# Patient Record
Sex: Male | Born: 1940
Health system: Southern US, Community
[De-identification: ages and names within clinical notes are randomized; demographics above are authoritative.]

## PROBLEM LIST (undated history)

## (undated) DIAGNOSIS — H532 Diplopia: Secondary | ICD-10-CM

## (undated) DIAGNOSIS — R42 Dizziness and giddiness: Secondary | ICD-10-CM

## (undated) DIAGNOSIS — E785 Hyperlipidemia, unspecified: Secondary | ICD-10-CM

## (undated) DIAGNOSIS — M549 Dorsalgia, unspecified: Secondary | ICD-10-CM

## (undated) DIAGNOSIS — K449 Diaphragmatic hernia without obstruction or gangrene: Secondary | ICD-10-CM

## (undated) DIAGNOSIS — I10 Essential (primary) hypertension: Secondary | ICD-10-CM

## (undated) DIAGNOSIS — E669 Obesity, unspecified: Secondary | ICD-10-CM

## (undated) DIAGNOSIS — K635 Polyp of colon: Secondary | ICD-10-CM

## (undated) DIAGNOSIS — I2510 Atherosclerotic heart disease of native coronary artery without angina pectoris: Secondary | ICD-10-CM

## (undated) DIAGNOSIS — R55 Syncope and collapse: Secondary | ICD-10-CM

## (undated) DIAGNOSIS — R001 Bradycardia, unspecified: Secondary | ICD-10-CM

## (undated) HISTORY — DX: Syncope and collapse: R55

## (undated) HISTORY — PX: CORONARY ARTERY BYPASS GRAFT: SHX141

## (undated) HISTORY — DX: Obesity, unspecified: E66.9

## (undated) HISTORY — DX: Diplopia: H53.2

## (undated) HISTORY — DX: Atherosclerotic heart disease of native coronary artery without angina pectoris: I25.10

## (undated) HISTORY — DX: Polyp of colon: K63.5

## (undated) HISTORY — DX: Diaphragmatic hernia without obstruction or gangrene: K44.9

## (undated) HISTORY — DX: Hyperlipidemia, unspecified: E78.5

## (undated) HISTORY — DX: Dorsalgia, unspecified: M54.9

## (undated) HISTORY — DX: Dizziness and giddiness: R42

## (undated) HISTORY — DX: Bradycardia, unspecified: R00.1

## (undated) HISTORY — DX: Essential (primary) hypertension: I10

---

## 2003-12-21 ENCOUNTER — Encounter (INDEPENDENT_AMBULATORY_CARE_PROVIDER_SITE_OTHER): Payer: Self-pay | Admitting: Specialist

## 2003-12-21 ENCOUNTER — Ambulatory Visit (HOSPITAL_COMMUNITY): Admission: RE | Admit: 2003-12-21 | Discharge: 2003-12-21 | Payer: Self-pay | Admitting: Gastroenterology

## 2007-07-04 ENCOUNTER — Inpatient Hospital Stay (HOSPITAL_COMMUNITY): Admission: EM | Admit: 2007-07-04 | Discharge: 2007-07-09 | Payer: Self-pay | Admitting: Emergency Medicine

## 2007-07-04 ENCOUNTER — Encounter (INDEPENDENT_AMBULATORY_CARE_PROVIDER_SITE_OTHER): Payer: Self-pay | Admitting: Cardiology

## 2007-07-04 ENCOUNTER — Ambulatory Visit: Payer: Self-pay | Admitting: Thoracic Surgery (Cardiothoracic Vascular Surgery)

## 2007-08-01 ENCOUNTER — Ambulatory Visit: Payer: Self-pay | Admitting: Thoracic Surgery (Cardiothoracic Vascular Surgery)

## 2007-08-01 ENCOUNTER — Encounter
Admission: RE | Admit: 2007-08-01 | Discharge: 2007-08-01 | Payer: Self-pay | Admitting: Thoracic Surgery (Cardiothoracic Vascular Surgery)

## 2009-05-21 ENCOUNTER — Inpatient Hospital Stay (HOSPITAL_COMMUNITY): Admission: EM | Admit: 2009-05-21 | Discharge: 2009-05-22 | Payer: Self-pay | Admitting: Emergency Medicine

## 2010-03-08 ENCOUNTER — Encounter
Admission: RE | Admit: 2010-03-08 | Discharge: 2010-03-08 | Payer: Self-pay | Admitting: Physical Medicine and Rehabilitation

## 2010-04-08 ENCOUNTER — Inpatient Hospital Stay (HOSPITAL_COMMUNITY): Admission: RE | Admit: 2010-04-08 | Discharge: 2010-04-09 | Payer: Self-pay | Admitting: Neurosurgery

## 2010-07-01 ENCOUNTER — Ambulatory Visit: Payer: Self-pay | Admitting: Cardiovascular Disease

## 2010-07-01 ENCOUNTER — Ambulatory Visit (HOSPITAL_COMMUNITY): Admission: RE | Admit: 2010-07-01 | Discharge: 2010-07-01 | Payer: Self-pay | Admitting: Cardiovascular Disease

## 2010-07-01 HISTORY — PX: US ECHOCARDIOGRAPHY: HXRAD669

## 2010-10-17 ENCOUNTER — Ambulatory Visit: Payer: Self-pay | Admitting: Cardiovascular Disease

## 2011-02-14 LAB — CBC
HCT: 40.8 % (ref 39.0–52.0)
MCHC: 34.5 g/dL (ref 30.0–36.0)
RDW: 13.6 % (ref 11.5–15.5)
WBC: 5.8 10*3/uL (ref 4.0–10.5)

## 2011-02-14 LAB — COMPREHENSIVE METABOLIC PANEL
Albumin: 3.9 g/dL (ref 3.5–5.2)
Alkaline Phosphatase: 47 U/L (ref 39–117)
CO2: 26 mEq/L (ref 19–32)
Chloride: 109 mEq/L (ref 96–112)
Creatinine, Ser: 1.45 mg/dL (ref 0.4–1.5)
GFR calc non Af Amer: 49 mL/min — ABNORMAL LOW (ref >60)
Glucose, Bld: 107 mg/dL — ABNORMAL HIGH (ref 70–99)
Potassium: 3.9 mEq/L (ref 3.5–5.1)
Sodium: 143 mEq/L (ref 135–145)
Total Bilirubin: 0.7 mg/dL (ref 0.3–1.2)

## 2011-02-14 LAB — DIFFERENTIAL
Basophils Relative: 1 % (ref 0–1)
Eosinophils Relative: 4 % (ref 0–5)
Lymphs Abs: 2 10*3/uL (ref 0.7–4.0)
Neutro Abs: 3 10*3/uL (ref 1.7–7.7)

## 2011-02-14 LAB — SURGICAL PCR SCREEN
MRSA, PCR: NEGATIVE
Staphylococcus aureus: NEGATIVE

## 2011-03-06 LAB — LIPID PANEL
Cholesterol: 134 mg/dL (ref 0–200)
HDL: 29 mg/dL — ABNORMAL LOW (ref >39)
LDL Cholesterol: 65 mg/dL (ref 0–99)
Total CHOL/HDL Ratio: 4.6 RATIO
Triglycerides: 201 mg/dL — ABNORMAL HIGH (ref <150)

## 2011-03-06 LAB — CBC
HCT: 39.9 % (ref 39.0–52.0)
HCT: 40.3 % (ref 39.0–52.0)
Hemoglobin: 13.5 g/dL (ref 13.0–17.0)
Hemoglobin: 13.8 g/dL (ref 13.0–17.0)
MCHC: 33.8 g/dL (ref 30.0–36.0)
MCHC: 34.1 g/dL (ref 30.0–36.0)
MCV: 94.4 fL (ref 78.0–100.0)
Platelets: 181 10*3/uL (ref 150–400)
Platelets: 186 10*3/uL (ref 150–400)
RBC: 4.27 MIL/uL (ref 4.22–5.81)
RDW: 14.1 % (ref 11.5–15.5)
RDW: 14.5 % (ref 11.5–15.5)
WBC: 6 K/uL (ref 4.0–10.5)

## 2011-03-06 LAB — PROTIME-INR
INR: 1.2 (ref 0.00–1.49)
Prothrombin Time: 15.7 seconds — ABNORMAL HIGH (ref 11.6–15.2)

## 2011-03-06 LAB — BASIC METABOLIC PANEL WITH GFR
BUN: 15 mg/dL (ref 6–23)
Calcium: 9.5 mg/dL (ref 8.4–10.5)
Chloride: 109 meq/L (ref 96–112)
Creatinine, Ser: 1.24 mg/dL (ref 0.4–1.5)
GFR calc Af Amer: >60 mL/min (ref >60)
Glucose, Bld: 110 mg/dL — ABNORMAL HIGH (ref 70–99)
Potassium: 3.7 meq/L (ref 3.5–5.1)
Sodium: 138 meq/L (ref 135–145)

## 2011-03-06 LAB — COMPREHENSIVE METABOLIC PANEL
AST: 22 U/L (ref 0–37)
Albumin: 3.4 g/dL — ABNORMAL LOW (ref 3.5–5.2)
Albumin: 3.8 g/dL (ref 3.5–5.2)
Alkaline Phosphatase: 47 U/L (ref 39–117)
BUN: 15 mg/dL (ref 6–23)
Calcium: 10 mg/dL (ref 8.4–10.5)
Calcium: 9.6 mg/dL (ref 8.4–10.5)
Chloride: 110 mEq/L (ref 96–112)
GFR calc Af Amer: 59 mL/min — ABNORMAL LOW (ref >60)
Glucose, Bld: 160 mg/dL — ABNORMAL HIGH (ref 70–99)
Potassium: 3.5 mEq/L (ref 3.5–5.1)
Total Protein: 5.8 g/dL — ABNORMAL LOW (ref 6.0–8.3)
Total Protein: 6.5 g/dL (ref 6.0–8.3)

## 2011-03-06 LAB — DIFFERENTIAL
Basophils Absolute: 0 K/uL (ref 0.0–0.1)
Basophils Relative: 1 % (ref 0–1)
Eosinophils Absolute: 0.3 10*3/uL (ref 0.0–0.7)
Eosinophils Relative: 5 % (ref 0–5)
Lymphocytes Relative: 38 % (ref 12–46)
Lymphs Abs: 2.3 K/uL (ref 0.7–4.0)
Monocytes Absolute: 0.5 K/uL (ref 0.1–1.0)
Monocytes Relative: 8 % (ref 3–12)
Neutro Abs: 2.9 10*3/uL (ref 1.7–7.7)
Neutrophils Relative %: 49 % (ref 43–77)

## 2011-03-06 LAB — POCT CARDIAC MARKERS
CKMB, poc: 1.4 ng/mL (ref 1.0–8.0)
Troponin i, poc: <0.05 ng/mL (ref 0.00–0.09)

## 2011-03-06 LAB — CK TOTAL AND CKMB (NOT AT ARMC)
CK, MB: 3.4 ng/mL (ref 0.3–4.0)
Relative Index: 2.5 (ref 0.0–2.5)
Total CK: 134 U/L (ref 7–232)

## 2011-03-06 LAB — APTT: aPTT: 36 s (ref 24–37)

## 2011-03-06 LAB — BASIC METABOLIC PANEL
CO2: 22 mEq/L (ref 19–32)
GFR calc non Af Amer: 58 mL/min — ABNORMAL LOW (ref >60)

## 2011-04-11 NOTE — H&P (Signed)
NAMESTEVIE, CHARTER NO.:  [id number]   MEDICAL RECORD NO.:  [id number]          PATIENT TYPE:  EMS   LOCATION:  MAJO                         FACILITY:  [facility]   PHYSICIAN:  Antonieta Iba, MD   DATE OF BIRTH:  1941-01-19   DATE OF ADMISSION:  05/21/2009  DATE OF DISCHARGE:                              HISTORY & PHYSICAL   James Barnes is a  70 year old male patient who comes to the emergency  room with complaints of chest pain.  He does have a prior cardiac  history of coronary bypass grafting on July 05, 2007 by Dr. Charlett Lango.  He had a LIMA to his LAD and RIMA to his  RCA and SVG to  his OM.  Since his bypass grafting, he has been doing well without any  complaints of angina until today.  He had right-sided chest pain  radiating to his right arm without  shortness of breath or diaphoresis.  He eventually took a nitroglycerin.  It did decrease the pain but the  pain continued on and he came to the emergency room.  He did receive  four baby aspirin in the emergency room.  His EKG shows no change from  EKG in 2008.   PAST MEDICAL HISTORY:  1. BPH.  2. Hyperlipidemia.  3. Hypertension.  4. Coronary artery disease.  5. Hiatal hernia.  6. History of sinus bradycardia.  He is not on a beta blocker because      of this.   MEDICATIONS:  1. Ramipril 5 mg every day.  2. Crestor 20 mg every day.  3. A BPH med - we are not sure at this time of the exact medication.  4. Aspirin 81 mg a day.   ALLERGIES:  NKDA.   FAMILY HISTORY:  Positive for coronary artery disease in his mother who  died at age 63 of an MI.  He has one brother who died of heart disease  and his father died as a suicide.   SOCIAL HISTORY:  He has 2 children and  5 grandchildren.  He does not  drink any alcohol.  He does smoke a pipe.  He  was unable to tell us the  amount; he just states a lot.   REVIEW OF SYSTEMS:  He denies any palpitations.  No presyncope, no  syncope.  Positive  for indigestion, heartburn.  It started with acid  foods. It has now progressed to having indigestion with just water. It  occurs after he eats. He denies any fever, cold, cough or chills  recently.  No palpitations.  No hematochezia.  No black tarry stools.  Positive for minor lower extremity edema which he seems to think  is his  Crestor.   PHYSICAL EXAMINATION:  VITAL SIGNS:  Blood pressure is 102/67, heart  rate 68, respirations are 16, temperature is 98.  GENERAL:  He is a bearded overweight gray aired male in no acute  distress.  HEENT: Pupils equal, round.  No thyromegaly, no adenopathy.  2+ carotid  pulses, no bruits.  Respirations are clear to auscultation  bilaterally  with good inspiratory effort.  CARDIAC:  Heart sounds are regular.  S1-S2 present.  No murmur or gallop  is noted.  GI:  Bowel sounds present x4.  No mass or tenderness.  MUSCULOSKELETAL:  Moves all extremities x4.  NEUROLOGIC:  Alert and oriented.  No focal deficits.  Strength is equal  bilaterally.  SKIN:  Warm and dry.  No lesions noted.  LOWER EXTREMITIES:  He has some minimal lower extremity edema.  Good  dorsalis pedis pulses.   LABS:  Sodium 138, potassium 3.7, BUN 15, creatinine 1.24, hematocrit  40, platelets 186.  WBCs are 6 and his hemoglobin was 13.8.  His enzymes  are negative thus far in the emergency room.  Again his EKG shows no  change from previous EKG.  He is sinus rhythm at 66 with incomplete  right bundle branch block.   ASSESSMENT:  1. Unstable angina.  2. Known coronary artery disease with history of coronary bypass      grafting August 2008 by Dr. Charlett Lango.  3. Hypertension.  4. Hyperlipidemia.  5. Recent increase in indigestion/ gastroesophageal reflux disease,      occurs after eating.  6. Benign prostatic hypertrophy.   PLAN:  He was seen and evaluated by Dr. Antonieta Iba.  We will plan  to admit and rule out for an MI and possibly do a cardiac  catheterization  depending upon his labs and if he has any further chest  pain.  If all of that is negative then he will have an outpatient  stress.      James Barnes, N.P.      Antonieta Iba, MD  Electronically Signed    BB/MEDQ  D:  05/21/2009  T:  05/21/2009  Job:  [id number]   cc:   Viviann Spare Dr. Andee Poles

## 2011-04-11 NOTE — Letter (Signed)
August 01, 2007   [name], M.D.  [phone], [address]  [place], [place] [address]   Re:  ONAJE, WARNE                DOB:  09/23/1941   Dear Maisie Fus:   I saw Kassidy Frankson back in the office today.  As you know, Mr. Pella is  a very nice, 70 year old gentleman who presented with unstable angina.  He underwent coronary artery bypass grafting on August 8, and is  progressing quite well at this point in time.  I have enclosed a copy of  my office notes for your records.   Again, thank you very much for allowing me to participate in Mr.  Guimond's care.  He is a delightful gentleman and it has been my pleasure  to be involved.   Salvatore Decent Dorris Fetch, M.D.  Electronically Signed   SCH/MEDQ  D:  08/01/2007  T:  08/01/2007  Job:  [id number]   cc:   Brett Canales A. Cleta Alberts, M.D.

## 2011-04-11 NOTE — Discharge Summary (Signed)
James Barnes, James Barnes NO.:  [id number]   MEDICAL RECORD NO.:  [id number]          PATIENT TYPE:  INP   LOCATION:  2024                         FACILITY:  [facility]   PHYSICIAN:  Salvatore Decent. Dorris Fetch, M.D.DATE OF BIRTH:  Apr 26, 1941   DATE OF ADMISSION:  07/04/2007  DATE OF DISCHARGE:  07/09/2007                               DISCHARGE SUMMARY   HISTORY OF PRESENT ILLNESS:  The patient is a 69 year old male who came  to the emergency room on this admission secondary to chest pain.  He  recently underwent cardiac catheterization by Dr. Daphene Jaeger at the  [facility] and was found to have an ostial LAD,  to RCA and  circumflex disease.  Dr. Tresa Endo recommended him for a coronary artery  bypass grafting on July 02, 2007.  He was placed on metoprolol,  amlodipine and Imdur as an outpatient.  However he did not take the  amlodipine.  He called the office on the date of admission [facility] with complaints of chest pain that was  occurring all last evening prior.  He took two nitroglycerin and some  aspirin and this decreased the pain, however, he continued to have  ongoing tightness.  During the morning when he awoke, he noted some  chest discomfort with activity as well as some left leg cramping.  He  called the office and was told to come promptly to the emergency room.   PAST MEDICAL HISTORY:  1. Coronary artery disease as described above.  2. He also has history of cardiac catheterization in 1996 by Dr.      Elease Hashimoto, having monitors disease at that time.  He was seen by Dr.      Elsie Lincoln in 2000 and the cath on July 02, 2007, showed severe three-      vessel disease as described above.  He has normal left ventricular      function.  3. Hyperlipidemia.  4. History of sinus bradycardia.   MEDICATIONS PRIOR TO ADMISSION:  Which were all new,  1. Metoprolol 50 mg one half tablet daily.  2. Imdur 60 mg daily.  3. Nitroglycerin  p.r.n.  4. He does not take an aspirin on a regular basis.   ALLERGIES:  NO KNOWN DRUG ALLERGIES.   FAMILY HISTORY:  Please see the history and physical done at the time of  admission.   SOCIAL HISTORY:  Please see the history and physical done at the time of  admission.   REVIEW OF SYSTEMS:  Please see the history and physical done at the time  of admission.   PHYSICAL EXAMINATION:  Please see the history and physical done at time  of admission.   HOSPITAL COURSE:  The patient was admitted for unstable angina with  known three-vessel coronary disease.  A cardiothoracic consultation was  obtained with the surgeons.  He was seen by Dr. Dorris Fetch who  evaluated the patient and his studies and agreed with recommendations to  proceed with surgical revascularization.   PROCEDURE:  On July 05, 2007, he was taken to the  operating room at  which time he underwent the following procedure:  Coronary artery bypass  grafting x3.  The following grafts placed:  1. Left internal mammary artery to the left anterior descending.  2. Right internal mammary artery to the right coronary artery.  3. Saphenous vein graft to the obtuse marginal #1.  The patient tolerated the procedure well and was taken to the surgical  intensive care unit in stable condition.   POSTOPERATIVE HOSPITAL COURSE:  The patient has done well.  He remained  hemodynamically stable.  He was weaned from all pressors without  difficulty.  He was weaned from the ventilator without difficulty.  All  routine lines, monitors and drainage devices have been discontinued in  the standard fashion.  He does have mild postoperative anemia but this  has stabilized.  Most recent hemoglobin and hematocrit dated July 08, 2007, are 10 and 30, respectively.  Electrolytes, BUN and creatinine are  within normal limits.  He did have a slight bump in his creatinine when  placed on ACE inhibitor but this has subsequently been discontinued.   His incisions are all healing well without signs of infection.  He is  edematous, required further diuresis as outpatient.  He is ambulating  currently with a walker primarily for balance and confidence.  He  requests a walker to be obtained for his use at home and we will assist  him with this.  Overall his status is felt to be stable for tentative  discharge in the morning of July 09, 2007, pending morning round  reevaluation.   MEDICATIONS:  Currently of dictation  1. Aspirin 325 mg daily.  2. Toprol XL 25 mg daily.  3. Zocor 40 mg daily.  4. Lasix 40 mg daily for 7 days.  5. K-Dur 20 mEq daily for 7 days.  6. For pain, Vicodin 5/500 one or two q.4h. as needed.   INSTRUCTIONS:  The patient received written instructions regarding  medications, activity, diet, wound care and follow-up.   FOLLOW UP:  1. Dr. Elsie Lincoln in two weeks.  2. Dr. Dorris Fetch on August 01, 2007, at 12 noon with a chest x-      ray one half hour prior to that at the [facility].   FINAL DIAGNOSIS:  Severe three-vessel coronary artery disease with  presenting unstable angina, now status post surgical revascularization  as described.   OTHER DIAGNOSES:  1. Hyperlipidemia.  2. History of tobacco use.  3. Mild postoperative anemia.  4. Obesity.      Rowe Clack, P.A.-C.      Salvatore Decent Dorris Fetch, M.D.  Electronically Signed    WEG/MEDQ  D:  07/08/2007  T:  07/09/2007  Job:  [id number]   cc:   [name], M.D.

## 2011-04-11 NOTE — Op Note (Signed)
NAMEJAYMIAN, Barnes NO.:  [id number]   MEDICAL RECORD NO.:  [id number]          PATIENT TYPE:  INP   LOCATION:  2312                         FACILITY:  [facility]   PHYSICIAN:  James Barnes, M.D.DATE OF BIRTH:  07/12/1941   DATE OF PROCEDURE:  07/05/2007  DATE OF DISCHARGE:                               OPERATIVE REPORT   PREOPERATIVE DIAGNOSIS:  Three vessel coronary disease with unstable  angina.   POSTOPERATIVE DIAGNOSIS:  Three vessel coronary disease with unstable  angina.   PROCEDURE:  Median sternotomy, coronary bypass grafting x3 (left  internal mammary artery to left anterior descending, right internal  mammary artery to right coronary, saphenous vein graft to obtuse  marginal one), endoscopic vein harvest, right thigh.   SURGEON:  James Barnes, M.D.   ASSISTANT:  Zadie Rhine, P.A.-C.   ANESTHESIA:  General.   FINDINGS:  Good quality targets, good quality conduits.   CLINICAL NOTE:  Mr. James Barnes is a 70 year old gentleman who presented with  new onset unstable angina.  At catheterization, he had three vessel  coronary disease with a tight critical right coronary lesion as well as  a 70% ostial circumflex lesion and a 60% ostial LAD lesion.  The patient  was scheduled to see [place] in the office, however presented with unstable  angina on July 04, 2007, and was admitted.  His pain resolved with  heparin and nitroglycerin.  The patient was advised to undergo the  coronary bypass grafting. He understood, accepted the risks, and agreed  to proceed.   OPERATIVE NOTE:  Mr. James Barnes was brought to the preop holding area on  July 05, 2007.  The anesthesia service placed lines for monitoring  arterial, central venous, and pulmonary arterial pressure.  Intravenous  antibiotics were administered.  He was taken to the operating room,  anesthetized and intubated.  A Foley catheter was placed.  The chest,  abdomen, and legs were prepped and  draped in the usual fashion.  An  incision was made in the medial aspect of the right leg at the level of  the knee and the greater saphenous vein was harvested from the right  thigh endoscopically.  It was of good quality.  Simultaneously, a median  sternotomy was performed.  The left internal mammary artery was  harvested under direct vision using standard technique.  5,000 units of  heparin was administered during the vessel harvest.  There was excellent  flow through the cut end of the left mammary window when it was divided  distally.  Next, the right internal mammary artery was harvested in a  similar fashion.  There was, likewise, excellent flow through this graft  when divided distally.   The remaining full heparin dose was administered.  Anticoagulation was  monitored with ACT measurement throughout the procedure.  The  pericardium was opened.  The ascending aorta was inspected and was  cannulated via concentric 2-0 Ethibond pledgeted pursestring sutures.  A  dual stage venous cannula was placed via pursestring suture in the right  atrial appendage.  Cardiopulmonary bypass was instituted.  The patient  was cooled to 32 degrees Celsius.  The coronary arteries were inspected  and the anastomotic sites were chosen.  The conduits were inspected and  cut to length. A foam pad was placed in the pericardium to protect the  left phrenic nerve.  A temperature probe was placed in the myocardial  septum.  A cardioplegic cannula was placed in the ascending aorta.   The aorta was crossclamped.  The left ventricle was emptied via the  aortic root vent.  Cardiac arrest was achieved with a combination of  cold antegrade blood cardioplegia and topical iced saline.  After  achieving a complete diastolic arrest and adequate myocardial septal  cooling with 1 liter of blood cardioplegia, the following distal  anastomoses were performed.   First, a reverse saphenous vein graft was placed  end-to-side to the main  trunk of the obtuse marginal branch of the left circumflex.  This was a  2 mm good quality target.  The vein was of good quality.  The  anastomosis was performed end-to-side with running 7-0 Prolene suture.  There was excellent flow through the graft.  Cardioplegia was  administered.  There was good hemostasis.   Next, the right internal mammary artery was brought through a window in  the pericardium and was beveled distally.  It was anastomosed end-to-  side to the right coronary artery at the acute margin of the heart.  The  right coronary was disease free beyond this point and a 1.5 mm probe  passed easily into the posterior descending as well as the continuation  of the right coronary.  The mammary was a 1.5-mm good quality conduit.  The anastomosis was performed with a running 8-0 Prolene suture. At the  completion of the anastomosis, the bulldog clamp was briefly removed to  inspect for hemostasis and then was replaced and the mammary pedicle was  tacked to the epicardial surface of the heart with 6-0 Prolene sutures.  Additional cardioplegia was administered.   Next, the left internal mammary artery was brought through a window in  the pericardium.  The distal end was beveled and anastomosed end-to-side  to the LAD.  The LAD was a 1.5-mm good quality target.  The mammary was  a 2-mm good quality conduit.  The anastomosis was performed with a  running 8-0 Prolene suture in an end-to-side fashion. At the completion  of the mammary to LAD anastomosis, the bulldog clamp was briefly removed  to inspect for hemostasis.  Immediate rapid septal rewarming was noted.  The bulldog clamp was replaced and the mammary pedicle was tacked to the  epicardial surface of the heart with 6-0 Prolene sutures.   Additional cardioplegia was administered.  The vein graft was cut to  length.  The cardioplegic cannula was removed from the ascending aorta.  The proximal vein graft  anastomosis was performed to a 4.5 mm punch  aortotomy with a running 6-0 Prolene suture while under crossclamp. At  the completion of the proximal anastomoses, the patient was placed in  Trendelenburg position. Lidocaine was administered.  The bulldog clamps  were removed from both mammary arteries.  The aortic root was de-aired  and the aortic crossclamp was removed.  The total crossclamp time was 53  minutes.   The patient required a single stimulation of 20 joules and remained in  sinus rhythm, thereafter.  While the patient was being rewarmed, all  proximal and distal anastomoses were inspected for hemostasis.  Epicardial pacing wires were placed on  the right ventricle and right  atrium and DDD pacing was initiated.  The patient was rewarmed to a core  temperature of 37 degrees Celsius and he was weaned from cardiopulmonary  bypass without difficulty on the first attempt.  The total bypass time  was 82 minutes.  The initial cardiac output was greater than 2 liters  per minute per meter squared.  The patient remained hemodynamically  stable throughout the bypass period.   A test dose of protamine was administered and was well tolerated. The  atrial and aortic cannulae were removed.  The remainder of the protamine  was administered without incident.  The chest was irrigated with 1 liter  of warm normal saline containing 1 gram of vancomycin.  Hemostasis was  achieved.  The pericardium was reapproximated with interrupted 3-0 silk  sutures.  It came together easily without tension.  Bilateral pleural  and single mediastinal chest tubes were placed through separate  subcostal incisions.  The sternum was closed with interrupted single and  double heavy gauge stainless steel wires.  The remaining incisions were  closed in standard fashion.  On-Q catheters  were placed bilaterally adjacent to the chest wound exiting between the  chest tube sites for infiltration of local anesthetic. The  patient was  taken from the operating room to the surgical intensive care unit in  critical but stable condition.  All sponge, needle and instrument counts  were correct at end of the procedure.      James Decent Dorris Barnes, M.D.     SCH/MEDQ  D:  07/05/2007  T:  07/06/2007  Job:  [id number]   cc:   James Barnes, M.D.  James Barnes, M.D.

## 2011-04-11 NOTE — H&P (Signed)
NAME:  KAEL, KEETCH NO.:  [id number]   MEDICAL RECORD NO.:  [id number]          PATIENT TYPE:  EMS   LOCATION:  MAJO                         FACILITY:  [facility]   PHYSICIAN:  Ritta Slot, MD     DATE OF BIRTH:  1941-01-28   DATE OF ADMISSION:  07/04/2007  DATE OF DISCHARGE:                              HISTORY & PHYSICAL   HISTORY OF PRESENT ILLNESS:  Mr. Machnik is a 70 year old who came to the  emergency room brought by a neighbor because of chest pain.  He recently  underwent cardiac catheterization by Dr. [name] at [facility].  He was found to have ostial LAD, RCA and circumflex  disease.  Dr. Tresa Endo recommended him for a coronary bypass grafting on  July 02, 2007.  He was placed on metoprolol, amlodipine and Imdur as an  outpatient.  However, he did not take the amlodipine.  He apparently  called the office this morning with complaints about chest pain all  night.  He states that last night he started having chest pain.  He took  2 nitroglycerin and some aspirin.  That decreased the patient; however,  it continued on.  At 4 a.m. he realized he was still having chest  tightness.  This morning after he arose, he noticed that he had chest  discomfort with activity.  He also had a left leg cramp, which he has a  history of chronic leg cramps, and he decided to call the office.  He  was told to come to emergency room.   PAST MEDICAL HISTORY:  Coronary artery disease previous years.  In 1996,  he had a cardiac cath by Dr. Melburn Popper and he had minor disease at that  time.  He had been seen by Dr. Elsie Lincoln in the year 2000.  Cath July 02, 2007 showed severe three-vessel disease as described above.  He has  normal LV function.  Hyperlipidemia.  He has sinus bradycardia.   MEDICATIONS:  All of which are new,  1. Metoprolol 50 mg one half every day.  2. Imdur 60 mg every day.  3. Nitroglycerin 1/150 p.r.n.  4. He states he does not take aspirin on  a regular basis.   ALLERGIES:  No known drug allergies.   FAMILY HISTORY:  His mother died at 69 with a myocardial infarction.  He  had one brother died with heart disease.  His father died as a suicide.  He has a lot of depression in his family.  One sister has breast cancer.  One brother has Parkinson's disease.  One sister has depression.   SOCIAL HISTORY:  He is divorced.  He has two children and five  grandchildren.  He lives alone.  He is active.  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  He does not exercise, but does woodworking  a lot around the house.  He does not sit very long.  He smokes a pipe  and he uses no street drugs.   REVIEW OF SYMPTOMS:  He denies any presyncope, no syncope.  No  unilateral weakness.  No visual changes.  No cough.  No cold.  No  infection.  Positive chest pain.  Positive dyspnea on exertion.  No PND.  No GERD.  Other systems are negative.   VITAL SIGNS:  Blood pressure 131/60, pulse 50, by EKG it is 48.  He has  sinus brady.  Respirations are 18, temperature 98.8.   LABORATORY DATA:  Hemoglobin 13.6, hematocrit 39.6, platelets 194, WBC  5.8.  INR 1.1.  Point-of-care marker CK-MB is 1.0.  Troponin less than  0.05.  Magnesium 2.1, sodium 137, potassium 3.6, BUN 12, creatinine  1.13, and glucose is 95.  Chest x-ray shows a low lung volume.  It shows  vascular congestion.   PHYSICAL EXAMINATION:  GENERAL:  He is sleepy, but he awakens easily.  HEENT:  Pupils equal, round, 2+ carotids.  No bruits.  He has no  thyromegaly.  NECK:  Supple.  No adenopathy.  RESPIRATORY:  Decreased breath sounds throughout.  CARDIOVASCULAR:  Heart sounds regular, no murmur or gallop.  S1 and S1.  ABDOMEN:  Bowel sounds present x4.  No mass, positive obese abdomen.  MUSCULOSKELETAL:  Moves all extremities x4.  No dysmobility.  NEURO:  Alert and oriented.  Facial symmetry equal and no focal  abnormality.   ASSESSMENT:  1. Unstable angina.  2. Known  severe ostial three-vessel coronary disease.  3. Normal ejection fraction.  4. Tobacco use.  5. History of hyperlipidemia.   PLAN:  We will admit him, put him on IV heparin, IV nitroglycerin.  We  will adjust his medications, call [facility] for a consult.  We will also  lipid drugs.      Lezlie Octave, N.P.      Ritta Slot, MD  Electronically Signed    BB/MEDQ  D:  07/04/2007  T:  07/05/2007  Job:  [id number]   cc:   Brett Canales A. Cleta Alberts, M.D.  Madaline Savage, M.D.

## 2011-04-11 NOTE — Assessment & Plan Note (Signed)
OFFICE VISIT   James Barnes, James Barnes A  DOB:  1941/05/12                                        August 01, 2007  CHART #:  [id number]   [name] is a 70 year old gentleman who presented with new onset  angina.  He underwent catheterization by Dr. Tresa Endo as an outpatient and  he had 3-vessel coronary disease.  He had scheduled to see [place] in the  office; however, he developed unstable angina and was admitted to the  hospital.  He underwent coronary artery bypass grafting x3 on August  8th.  Postoperatively, he did well.  He was discharged home on  postoperative day #4 without any significant complications.  Since then,  he has continued to do well.  He has been walking on a regular basis.  He says he is walking about 10 minutes at a time, up a couple of hills,  and does it 4-5 times a day.  He was having a lot of difficulty with  sleep initially, when he first got home he was having difficulty  sleeping because he could not lie flat and he could not sleep on his  back.  He got a prescription for some sleeping pills from Dr. Tresa Endo but  that did not seem to help but he says that has gotten better over time.  He realized that he had just some fear of going to sleep on his back and  that he was able to sleep through the night last night.  He is sleeping  on his side now without significant discomfort and is no longer using  the sleeping pills.  He did have some problems with constipation  initially.  That has subsequently resolved as well.  He is no longer  having to take any pain medications but he occasionally does take a  Tylenol.   PHYSICAL EXAMINATION:  GENERAL:  [name] is a well-appearing, 66-year-  old, white male in no acute distress.  VITAL SIGNS:  His blood pressure is 132/88, pulse 77, respirations 18,  oxygen saturation 98% on room air.  LUNGS:  Clear with equal breath sounds bilaterally.  CARDIAC:  Regular rate and rhythm.  Normal S1 and S2.  No  rubs, murmurs,  or gallops.  CHEST:  Sternal incision is clean, dry and intact.  The sternum is  stable.  EXTREMITIES:  Leg incision is healing well.  There is no peripheral  edema.   Chest x-ray shows good aeration of the lungs bilaterally.   IMPRESSION:  [name] is a 70 year old gentleman who is status post  coronary artery bypass grafting on August 8th.  He is progressing well  at this point in time.  I think he is a little bit ahead of the curve  with his overall recovery.  He did have a lot of difficulty with sleep  but that seems to have resolved.  He is having minimal discomfort and is  not having to take any narcotics.   He is anxious to return to work.  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  I advised him to wait at least a couple more  weeks before trying to do that even on a part-time basis.  He may begin  driving, appropriate precautions were discussed.  He is not to  lift  objects greater than 10 pounds for at least another 2 weeks.   He will continue to be followed by Dr. Tresa Endo from a cardiac standpoint  as well as by Dr. Cleta Alberts.  I would be happy to see him back at any time if  I could be of any further assistance with his care.   Salvatore Decent Dorris Fetch, M.D.  Electronically Signed   SCH/MEDQ  D:  08/01/2007  T:  08/01/2007  Job:  [id number]   cc:   [name], M.D.  [name], M.D.

## 2011-04-11 NOTE — Consult Note (Signed)
NAMEREED, DADY NO.:  [id number]   MEDICAL RECORD NO.:  [id number]          PATIENT TYPE:  INP   LOCATION:  2901                         FACILITY:  [facility]   PHYSICIAN:  Salvatore Decent. Dorris Fetch, M.D.DATE OF BIRTH:  July 25, 1941   DATE OF CONSULTATION:  07/04/2007  DATE OF DISCHARGE:                                 CONSULTATION   REASON FOR CONSULTATION:  Three-vessel disease with unstable angina.   CHIEF COMPLAINT:  Chest pain.   HISTORY OF PRESENT ILLNESS:  James Barnes is a 70 year old gentleman who  had a previous cardiac catheterization 12 years ago where he had  insignificant coronary disease.  He has had no history of significant  cardiac pathology. On Saturday while working, he noted a severe episode  of chest tightness. He says this was about a 7/10 in severity.  It came  on with exertion and was relieved by rest.  He had an episode again on  Sunday and was seen by cardiology by Dr. Tresa Endo in the office on Monday.  It was felt that he should have cardiac catheterization and that was  done as an outpatient on Tuesday and he was found to have three-vessel  coronary disease.  He had a critically tight stenosis in his right  coronary. He had about a 70% ostial circumflex and a 60% ostial LAD. The  patient was scheduled to see our group in the office next week, however,  last night he had another episode of pain which came on in his sleep.  This was persistent and severe. He called the office this morning after  having another episode and was told to come to the emergency room. He  was admitted, started on intravenous heparin and nitroglycerin.  This  pain has resolved. His cardiac enzymes showed a troponin of 0.02.  CPK  was 80 with MB of 1.9.   PAST MEDICAL HISTORY:  Significant for hyperlipidemia, diagnosed with  hypertension about a year ago. He denies diabetes, stroke, DVT previous  cardiac disease.   MEDICATIONS:  His medications which have all been  started recently are  metoprolol, XL 75 mg daily, Imdur 60 mg daily and sublingual  nitroglycerin.   He has no known drug allergies.   FAMILY HISTORY:  Significant for cardiac disease for which a brother  died. Depression is common in his family and also Parkinson's disease.   SOCIAL HISTORY:  He is divorced.  He has two children.  He remains  physically active. 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 He smokes a pipe.  He lives alone.   REVIEW OF SYSTEMS:  He has leg cramps at night, no claudication type  symptoms. He does wear glasses.  No recent change in bowel or bladder  habits and no orthopnea, paroxysmal nocturnal dyspnea, peripheral edema.  No history of excessive bleeding or bruising. All other systems are  negative.   PHYSICAL EXAMINATION:  James Barnes is a 70 year old white male in no  acute distress.  His blood pressure is 112/63, pulse is 46 and regular,  respirations are  16.  He is well-developed, well-nourished.  NEUROLOGICAL:  He is alert and oriented x3, grossly intact, rather  stoic.  HEENT:  He is wearing glasses, otherwise unremarkable.  He has good  dentition.  NECK:  Supple without thyromegaly or adenopathy. He has a left carotid  bruit.  CARDIAC:  Regular rate and rhythm.  Normal S1 and S2.  No rubs, murmurs  or gallops.  LUNGS:  Clear with equal breath sounds bilaterally.  ABDOMEN:  Soft, nontender.  EXTREMITIES:  Without clubbing, cyanosis or  edema.  He has 2+ pulses throughout.  There is no peripheral edema.  Skin is warm and dry.   LABORATORY DATA:  Cardiac enzymes as previously mentioned. Cardiac  catheterization was reviewed.  There is a 60% ostial LAD lesion, a 70%  ostial circumflex lesion and tandem 80 and 90% lesions in his right  coronary. His sodium is 137, potassium 3.6. BUN and creatinine 12 and  1.1. Glucose of 95.  LFTs within normal limits.  Albumin is 3.5 PT is  14.7, PTT is 35. His white count is 5.8, hematocrit  40, platelets 194.   IMPRESSION:  James Barnes is a 70 year old gentleman with three-vessel  coronary disease who presents with unstable angina. At catheterization 2  days ago, he was found to have a tight right lesion which is likely his  culprit vessel but he also has ostial LAD and circumflex disease and  coronary artery bypass grafting is indicated for survival benefit as  well as relief of symptoms and myocardial preservation. I have discussed  in detail with James Barnes the indications, risks, benefits and  alternatives for treatment.  He understands the LAD and circumflex  lesions are not amenable to percutaneous intervention.  He understands  that CABG has survival benefit as well as pain relief benefit over  medical therapy. James Barnes understands the risks include but are not  limited to death, stroke, MI, DVT, PE, bleeding, possible need for  transfusions, infections as well as other organ system dysfunction  including respiratory, renal or GI complications.  He understands and  accepts these risks and agrees to proceed.  Will plan to proceed with  bilateral mammary harvest as well as saphenous vein. He will be  scheduled for the OR first case tomorrow morning.      Salvatore Decent Dorris Fetch, M.D.  Electronically Signed     SCH/MEDQ  D:  07/04/2007  T:  07/05/2007  Job:  [id number]   cc:   [name], M.D.  [name], M.D.

## 2011-04-11 NOTE — Discharge Summary (Signed)
[name], [name] NO.:  [id number]   MEDICAL RECORD NO.:  [id number]          PATIENT TYPE:  INP   LOCATION:  2923                         FACILITY:  [facility]   PHYSICIAN:  James Barnes, M.D.     DATE OF BIRTH:  [birth year]   DATE OF ADMISSION:  05/21/2009  DATE OF DISCHARGE:  05/22/2009                               DISCHARGE SUMMARY   DISCHARGE DIAGNOSES:  1. Chest pain/angina, negative for CK-MBs and troponins.  He is for      outpatient stress testing.  2. Known coronary artery disease with history of coronary bypass      grafting by Dr. Charlett Barnes on July 05, 2007, with a left      internal mammary artery graft to his left anterior descending,      right internal mammary artery graft to his right coronary artery,      and saphenous vein graft to his obtuse marginal.  3. Dyslipidemia.  4. Sinus bradycardia, unable to be on a beta-blocker.  5. Benign prostatic hypertrophy.  6. Hypertension.  7. Hiatal hernia.  8. Gastroesophageal reflux disease.   LABORATORY DATA:  CK-MBs and troponins were negative.  Initial CBC  showed a hemoglobin of 13.8, hematocrit of 40.3, WBCs of 6, and  platelets 186.  His sodium was 138, potassium 3.7, glucose 110, BUN 15,  creatinine 1.24, magnesium was 2.4, total cholesterol is 134,  triglycerides 201, HDL was 29, LDL was 65.  Chest x-ray showed stable  borderline cardiomegaly, no acute abnormality.   DISCHARGE MEDICATIONS:  1. Ramipril 5 mg a day.  2. Crestor 20 mg a day.  3. Aspirin 81 mg a day.  4. His BPH medication and nitroglycerin 1/150 p.r.n.  5. Pepcid 20 mg twice per day.   HOSPITAL COURSE:  James Barnes is a 70 year old male patient of Dr. Nicki Barnes who came into the emergency room with complaints of right-sided  chest pain.  His pain was decreased with nitroglycerin, but not  completely gone.  It was decided because of his prior history of  coronary artery disease with coronary artery bypass grafting  in 2008  that he should be admitted and ruled out for MI.  Further treatment  would depend upon his lab status and further symptoms.  His CK-MBs and  troponins all came back negative.  The following day, he was seen by Dr.  Mariah Barnes.  He was chest painfree.  It was decided that he could be  discharged home.  Also, the patient did say that he was having increased  episodes of indigestion.  He was taking Tums at home and this was  relieving his  indigestion.  Thus, his chest pain could also be of GI etiology.  It was  decided that he could be worked up as an outpatient with a treadmill  Myoview.  The patient knows if he has any further problems, he can give  our office a call.  On the day of discharge, his blood pressure was  122/70, heart rate was 50, temperature was 97.7.  James Barnes, N.P.    ______________________________  James Barnes, M.D.    BB/MEDQ  D:  05/22/2009  T:  05/22/2009  Job:  [id number]   cc:   Salvatore Decent. Dorris Fetch, M.D.

## 2011-04-14 NOTE — Op Note (Signed)
NAMEBILAL, MANZER                            ACCOUNT NO.:  [id number]   MEDICAL RECORD NO.:  [id number]                   PATIENT TYPE:  AMB   LOCATION:  ENDO                                 FACILITY:  [facility]   PHYSICIAN:  Sylar L. Malon Kindle., M.D.          DATE OF BIRTH:  03-Aug-1941   DATE OF PROCEDURE:  12/21/2003  DATE OF DISCHARGE:                                 OPERATIVE REPORT   PROCEDURE PERFORMED:  Colonoscopy with polypectomy.   ENDOSCOPIST:  Llana Aliment. Edwards, M.D.   MEDICATIONS:  Fentanyl 75 mcg, Versed 7 mg IV.   INSTRUMENT USED:  Pediatric Olympus adjustable colonoscope.   INDICATIONS FOR PROCEDURE:  Colon cancer screening.   DESCRIPTION OF PROCEDURE:  The procedure had been explained to the patient  including the potential risks and benefits and consent obtained.  With the  patient in the left lateral decubitus position, the pediatric adjustable  Olympus colonoscope was inserted and advanced.  Prep was excellent and we  were able to advance to the cecum.  In the tip of the cecum a 0.5 cm sessile  polyp was encountered.  This was cauterized with a hot biopsy forceps and  placed in jar #1.  The scope was withdrawn back to the ascending colon.  In  the midascending colon, a 2 cm sessile polyp was removed in two pieces.  These were recovered with a basket and placed in jar #2.  No polyps were  seen in the transverse, descending or sigmoid colon.  At approximately 30 cm  from the anal verge at what was felt to be the rectosigmoid junction area, a  0.5 cm sessile polyp was cauterized and placed in jar #3.  In the rectum  exactly 15 cm from the anal verge, a 1.5 cm sessile polyp was found and was  removed with a snare in one piece and placed in jar #4.  There was no  bleeding at any of the polypectomy sites.  The scope was withdrawn.  The  patient tolerated the procedure well, there were no immediate complications.   ASSESSMENT:  Multiple colon polyps removed.   PLAN:   Will recommend routine postpolypectomy instructions.  Recommend two  year repeat due to the fact that he did have multiple polyps.                                               Kyro L. Malon Kindle., M.D.    Waldron Session  D:  12/21/2003  T:  12/21/2003  Job:  [id number]   cc:   Brett Canales A. Cleta Alberts, M.D.  [address]  [place]  [place] [address]  Fax: [phone]

## 2011-05-02 ENCOUNTER — Other Ambulatory Visit: Payer: Self-pay | Admitting: *Deleted

## 2011-05-02 NOTE — Telephone Encounter (Signed)
rx received from [facility], crestor 20mg . Pt needs labs drawn and was contacted. Pt had labs drawn already from pcp. Dr Wylene Simmer staff and the pharmacy notified dr Wylene Simmer will due refill, pt informed and ok.Alfonso Ramus RN

## 2011-07-05 ENCOUNTER — Other Ambulatory Visit: Payer: Self-pay | Admitting: *Deleted

## 2011-07-05 MED ORDER — ROSUVASTATIN CALCIUM 20 MG PO TABS: 20.0000 mg | ORAL_TABLET | Freq: Every day | ORAL | Status: DC

## 2011-07-05 NOTE — Telephone Encounter (Signed)
MESSAGE LEFT, NEEDS OV SOON, GVE REFILL OF CRESTOR FOR THREE MONTHS

## 2011-09-11 LAB — COMPREHENSIVE METABOLIC PANEL
ALT: 24
BUN: 12
CO2: 25
Calcium: 9.3
Creatinine, Ser: 1.13
GFR calc non Af Amer: >60
Glucose, Bld: 95
Sodium: 137

## 2011-09-11 LAB — POCT I-STAT 3, ART BLOOD GAS (G3+)
Acid-Base Excess: 1
Bicarbonate: 19.1 — ABNORMAL LOW
Bicarbonate: 21.5
Bicarbonate: 24.5 — ABNORMAL HIGH
O2 Saturation: 100
O2 Saturation: 98
Operator id: 261841
Operator id: 3390
Patient temperature: 32.4
Patient temperature: 36
Patient temperature: 37.1
TCO2: 23
pCO2 arterial: 36.1
pCO2 arterial: 38.3
pH, Arterial: 7.307 — ABNORMAL LOW
pH, Arterial: 7.378
pO2, Arterial: 102 — ABNORMAL HIGH
pO2, Arterial: 78 — ABNORMAL LOW

## 2011-09-11 LAB — CBC
HCT: 32.3 — ABNORMAL LOW
HCT: 34.5 — ABNORMAL LOW
HCT: 38.7 — ABNORMAL LOW
HCT: 39.6
Hemoglobin: 11.9 — ABNORMAL LOW
Hemoglobin: 13.6
MCHC: 34.3
MCHC: 34.4
MCV: 92.1
MCV: 92.2
MCV: 93
MCV: 93.8
MCV: 93.9
Platelets: 141 — ABNORMAL LOW
Platelets: 142 — ABNORMAL LOW
Platelets: 175
Platelets: 195
RBC: 3.23 — ABNORMAL LOW
RBC: 3.45 — ABNORMAL LOW
RBC: 4.29
RDW: 13.4
RDW: 13.6
RDW: 13.9
WBC: 10.2
WBC: 10.7 — ABNORMAL HIGH
WBC: 7
WBC: 7.8
WBC: 8.7

## 2011-09-11 LAB — I-STAT EC8
Acid-base deficit: 1
Acid-base deficit: 2
Bicarbonate: 25.4 — ABNORMAL HIGH
Chloride: 107
Glucose, Bld: 112 — ABNORMAL HIGH
HCT: 34 — ABNORMAL LOW
Operator id: 261841
Potassium: 3.7
Potassium: 4.4
Sodium: 141
TCO2: 27
pCO2 arterial: 47.1 — ABNORMAL HIGH
pH, Arterial: 7.34 — ABNORMAL LOW

## 2011-09-11 LAB — TYPE AND SCREEN: ABO/RH(D): A POS

## 2011-09-11 LAB — BASIC METABOLIC PANEL
BUN: 10
Calcium: 9
Chloride: 106
Chloride: 110
Creatinine, Ser: 1.14
Creatinine, Ser: 1.31
Creatinine, Ser: 1.54 — ABNORMAL HIGH
GFR calc Af Amer: 55 — ABNORMAL LOW
GFR calc Af Amer: >60
Glucose, Bld: 112 — ABNORMAL HIGH
Potassium: 4

## 2011-09-11 LAB — POCT I-STAT 4, (NA,K, GLUC, HGB,HCT)
Glucose, Bld: 105 — ABNORMAL HIGH
Glucose, Bld: 115 — ABNORMAL HIGH
Glucose, Bld: 97
HCT: 27 — ABNORMAL LOW
Hemoglobin: 12.9 — ABNORMAL LOW
Hemoglobin: 9.2 — ABNORMAL LOW
Hemoglobin: 9.2 — ABNORMAL LOW
Operator id: 261841
Operator id: 3390
Potassium: 4.1
Potassium: 5.3 — ABNORMAL HIGH
Potassium: 5.7 — ABNORMAL HIGH
Sodium: 135

## 2011-09-11 LAB — POCT I-STAT 3, VENOUS BLOOD GAS (G3P V)
O2 Saturation: 78
Patient temperature: 32.4
TCO2: 26
pH, Ven: 7.438 — ABNORMAL HIGH

## 2011-09-11 LAB — POCT I-STAT GLUCOSE
Glucose, Bld: 115 — ABNORMAL HIGH
Operator id: 3390

## 2011-09-11 LAB — LIPID PANEL
HDL: 30 — ABNORMAL LOW
LDL Cholesterol: 167 — ABNORMAL HIGH
Triglycerides: 153 — ABNORMAL HIGH
VLDL: 31

## 2011-09-11 LAB — POCT CARDIAC MARKERS
CKMB, poc: 1.1
CKMB, poc: <1 — ABNORMAL LOW
Troponin i, poc: <0.05
Troponin i, poc: <0.05

## 2011-09-11 LAB — TROPONIN I: Troponin I: 0.02

## 2011-09-11 LAB — BLOOD GAS, ARTERIAL
O2 Saturation: 95.1
Patient temperature: 98.3

## 2011-09-11 LAB — URINALYSIS, ROUTINE W REFLEX MICROSCOPIC
Bilirubin Urine: NEGATIVE
Hgb urine dipstick: NEGATIVE
Protein, ur: NEGATIVE
Urobilinogen, UA: 0.2

## 2011-09-11 LAB — CREATININE, SERUM
Creatinine, Ser: 1.37
GFR calc Af Amer: >60
GFR calc Af Amer: >60
GFR calc non Af Amer: 52 — ABNORMAL LOW
GFR calc non Af Amer: >60

## 2011-09-11 LAB — ABO/RH: ABO/RH(D): A POS

## 2011-09-11 LAB — MAGNESIUM
Magnesium: 2.1
Magnesium: 2.4
Magnesium: 2.4
Magnesium: 2.6 — ABNORMAL HIGH
Magnesium: 2.6 — ABNORMAL HIGH

## 2011-09-11 LAB — CARDIAC PANEL(CRET KIN+CKTOT+MB+TROPI): CK, MB: 1.5

## 2011-09-11 LAB — APTT
aPTT: 35
aPTT: 49 — ABNORMAL HIGH

## 2011-09-11 LAB — CK TOTAL AND CKMB (NOT AT ARMC): CK, MB: 1.9

## 2011-10-09 ENCOUNTER — Other Ambulatory Visit: Payer: Self-pay | Admitting: Cardiovascular Disease

## 2011-10-09 MED ORDER — ROSUVASTATIN CALCIUM 20 MG PO TABS: 20.0000 mg | ORAL_TABLET | Freq: Every day | ORAL | Status: DC

## 2011-11-13 ENCOUNTER — Other Ambulatory Visit: Payer: Self-pay | Admitting: *Deleted

## 2011-11-16 ENCOUNTER — Telehealth: Payer: Self-pay | Admitting: Cardiovascular Disease

## 2011-11-16 MED ORDER — ROSUVASTATIN CALCIUM 20 MG PO TABS: 20.0000 mg | ORAL_TABLET | Freq: Every day | ORAL | Status: DC

## 2011-11-16 NOTE — Telephone Encounter (Signed)
Pt given an app and refills till app, knows to come in fasting for lab work.

## 2011-11-16 NOTE — Telephone Encounter (Signed)
New Problem:    Called in to schedule an appointment because his Crestor has run out and it can't be refilled until he has been seen by Dr. Elease Hashimoto. Patient was dissatisfied with January appointment availabilities and wanted to be seen sooner because he has no crestor. Please call back.

## 2011-11-28 HISTORY — PX: CARDIOVASCULAR STRESS TEST: SHX262

## 2011-11-28 HISTORY — PX: US ECHOCARDIOGRAPHY: HXRAD669

## 2011-12-21 ENCOUNTER — Encounter: Payer: Self-pay | Admitting: Cardiovascular Disease

## 2011-12-21 ENCOUNTER — Encounter: Payer: Self-pay | Admitting: *Deleted

## 2011-12-28 ENCOUNTER — Encounter: Payer: Self-pay | Admitting: Cardiovascular Disease

## 2011-12-28 ENCOUNTER — Ambulatory Visit (INDEPENDENT_AMBULATORY_CARE_PROVIDER_SITE_OTHER): Payer: Medicare Other | Admitting: Cardiovascular Disease

## 2011-12-28 DIAGNOSIS — I2510 Atherosclerotic heart disease of native coronary artery without angina pectoris: Secondary | ICD-10-CM | POA: Insufficient documentation

## 2011-12-28 DIAGNOSIS — R55 Syncope and collapse: Secondary | ICD-10-CM

## 2011-12-28 DIAGNOSIS — E785 Hyperlipidemia, unspecified: Secondary | ICD-10-CM

## 2011-12-28 MED ORDER — ATORVASTATIN CALCIUM 20 MG PO TABS: 20.0000 mg | ORAL_TABLET | Freq: Every day | ORAL | Status: DC

## 2011-12-28 NOTE — Patient Instructions (Addendum)
Your physician recommends that you schedule a follow-up appointment in: 3 MONTHS WITH FASTING LABS  Your physician has requested that you have an echocardiogram. Echocardiography is a painless test that uses sound waves to create images of your heart. It provides your doctor with information about the size and shape of your heart and how well your heart's chambers and valves are working. This procedure takes approximately one hour. There are no restrictions for this procedure.   Your physician has requested that you have en exercise stress myoview. For further information please visit [email or web address] Please follow instruction sheet, as given.  Your physician has recommended you make the following change in your medication:   STOP CRESTOR  START ATORVASTATIN 20 MG DAILY    HOLD ALTACE FOR 1-2 WEEKS TO SEE IF FEEL BETTER/ NO SYNCOPE

## 2011-12-28 NOTE — Assessment & Plan Note (Addendum)
James Barnes is doing well. He has a history of coronary artery disease and is status post CABG 5 years ago. We will check a fasting lipid profile. He's not having any symptoms of angina. He is having some syncope which we will be evaluating.

## 2011-12-28 NOTE — Progress Notes (Signed)
    James Barnes Date of Birth  04/29/1941 [facility]     [place] [facility]  [address]    [address]   [address] [place], [place]  [address]    [place], [place]  [address] [phone]  Fax  [phone]  [phone]  Fax [phone]   Problem list: 1. Coronary artery disease-status post CABG in 2008 2. Dyslipidemia 3. Hypertension 4. Syncope History of Present Illness:  James Barnes is a 71 year old gentleman with the above-noted medical history. He's been having some episodes of syncope recently. These episodes tend to occur with exertion.  He denies any vertigo symptoms.  He's noted that he has these episodes of syncope with fairly low levels of exertion at this point.  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    He tolerates his Crestor fairly well although he would like to get something cheaper. He does note that he has some muscle aches but he thinks are associated with the Crestor    Current Outpatient Prescriptions on File Prior to Visit  Medication Sig Dispense Refill  . aspirin 325 MG tablet Take 325 mg by mouth daily.      . Multiple Vitamins-Minerals (MULTIVITAMIN PO) Take by mouth daily.      . ramipril (ALTACE) 5 MG tablet Take 5 mg by mouth daily.      . rosuvastatin (CRESTOR) 20 MG tablet Take 1 tablet (20 mg total) by mouth at bedtime.  30 tablet  1  . Tamsulosin HCl (FLOMAX) 0.4 MG CAPS Take 0.4 mg by mouth daily.        No Known Allergies  Past Medical History  Diagnosis Date  . HTN (hypertension)   . Back pain   . Dyslipidemia   . CAD (coronary artery disease)     status post CABG in 2008  . Sinus bradycardia     Hx of  . Hiatal hernia     Past Surgical History  Procedure Date  . Coronary artery bypass graft 2008    with a left internal mammary artery graft to his left anterior descending, right internal mammary  artery graft to his right coronary artery, and  saphenous  vein graft to his obtuse marginal.   . US echocardiography 07-01-2010    EF 55-60%    History  Smoking status  . Current Everyday Smoker  . Types: Pipe  Smokeless tobacco  . Not on file    History  Alcohol Use No    No family history on file.  Reviw of Systems:  Reviewed in the HPI.  All other systems are negative.  Physical Exam: BP 111/71  Pulse 59  Ht 5\' 9"  (1.753 m)  Wt 231 lb (104.781 kg)  BMI 34.11 kg/m2 The patient is alert and oriented x 3.  The mood and affect are normal.   Skin: warm and dry.  Color is normal.    HEENT:   Normocephalic/atraumatic. Normal carotids. His neck is supple. There is no JVD. Mucous membranes are moist.  Lungs: Lungs are clear   Heart: Regular rate S1-S2. No murmurs   Abdomen: Good bowel sounds. There is no hepatosplenomegaly.  Extremities:  No clubbing cyanosis or edema.  Neuro:  Normal neuro exam. Gait is normal    ECG: Normal sinus rhythm. He has an incomplete right bundle like.  Assessment / Plan:

## 2011-12-28 NOTE — Assessment & Plan Note (Signed)
I will presents with episodes of exertional syncope. He denies any real problems with vertigo. He does not have any angina with exertion but his symptoms are suspicious.  We'll get an echocardiogram. We will also schedule him for a stress Myoview study.  I've given him the okay to hold his Altase for a week or 2 to see if this helps with his syncope. I'll see him again in 3 months for followup visit. I told him that I would see him sooner if the echocardiogram or stress test revealed significant problems.

## 2012-01-03 ENCOUNTER — Ambulatory Visit (HOSPITAL_COMMUNITY): Payer: Medicare Other | Attending: Cardiovascular Disease

## 2012-01-03 DIAGNOSIS — I10 Essential (primary) hypertension: Secondary | ICD-10-CM | POA: Diagnosis not present

## 2012-01-03 DIAGNOSIS — E785 Hyperlipidemia, unspecified: Secondary | ICD-10-CM | POA: Insufficient documentation

## 2012-01-03 DIAGNOSIS — R55 Syncope and collapse: Secondary | ICD-10-CM

## 2012-01-03 DIAGNOSIS — F17200 Nicotine dependence, unspecified, uncomplicated: Secondary | ICD-10-CM | POA: Insufficient documentation

## 2012-01-03 DIAGNOSIS — I2510 Atherosclerotic heart disease of native coronary artery without angina pectoris: Secondary | ICD-10-CM

## 2012-01-08 ENCOUNTER — Ambulatory Visit (HOSPITAL_COMMUNITY): Payer: Medicare Other | Attending: Cardiology | Admitting: Radiology

## 2012-01-08 DIAGNOSIS — I2510 Atherosclerotic heart disease of native coronary artery without angina pectoris: Secondary | ICD-10-CM

## 2012-01-08 DIAGNOSIS — R55 Syncope and collapse: Secondary | ICD-10-CM | POA: Diagnosis not present

## 2012-01-08 DIAGNOSIS — Z951 Presence of aortocoronary bypass graft: Secondary | ICD-10-CM | POA: Diagnosis not present

## 2012-01-08 DIAGNOSIS — R0609 Other forms of dyspnea: Secondary | ICD-10-CM | POA: Insufficient documentation

## 2012-01-08 DIAGNOSIS — E669 Obesity, unspecified: Secondary | ICD-10-CM | POA: Insufficient documentation

## 2012-01-08 DIAGNOSIS — R0602 Shortness of breath: Secondary | ICD-10-CM

## 2012-01-08 DIAGNOSIS — R0989 Other specified symptoms and signs involving the circulatory and respiratory systems: Secondary | ICD-10-CM | POA: Insufficient documentation

## 2012-01-08 DIAGNOSIS — I10 Essential (primary) hypertension: Secondary | ICD-10-CM | POA: Diagnosis not present

## 2012-01-08 DIAGNOSIS — E785 Hyperlipidemia, unspecified: Secondary | ICD-10-CM | POA: Insufficient documentation

## 2012-01-08 DIAGNOSIS — F17200 Nicotine dependence, unspecified, uncomplicated: Secondary | ICD-10-CM | POA: Insufficient documentation

## 2012-01-08 DIAGNOSIS — Z8249 Family history of ischemic heart disease and other diseases of the circulatory system: Secondary | ICD-10-CM | POA: Diagnosis not present

## 2012-01-08 DIAGNOSIS — I4510 Unspecified right bundle-branch block: Secondary | ICD-10-CM | POA: Diagnosis not present

## 2012-01-08 MED ORDER — REGADENOSON 0.4 MG/5ML IV SOLN
0.4000 mg | Freq: Once | INTRAVENOUS | Status: AC
  Administered 2012-01-08: 0.4 mg via INTRAVENOUS

## 2012-01-08 MED ORDER — TECHNETIUM TC 99M TETROFOSMIN IV KIT
33.0000 | PACK | Freq: Once | INTRAVENOUS | Status: AC | PRN
  Administered 2012-01-08: 33 via INTRAVENOUS

## 2012-01-08 MED ORDER — TECHNETIUM TC 99M TETROFOSMIN IV KIT
11.0000 | PACK | Freq: Once | INTRAVENOUS | Status: AC | PRN
  Administered 2012-01-08: 11 via INTRAVENOUS

## 2012-01-08 NOTE — Progress Notes (Signed)
[facility] SITE 3 NUCLEAR MED [address] [place] [address] [phone]  Cardiology Nuclear Med Study  MASSIE MEES is a 71 y.o. male [id number] December 26, 1940   Nuclear Med Background Indication for Stress Test:  Evaluation for Ischemia and Graft Patency History:  '08 CABG; '10 ONG:EXBMWU, EF=60%; 01/03/12 Echo:EF=55-60% Cardiac Risk Factors: Family History - CAD, Hypertension, Lipids, Obesity and Smoker  Symptoms:  DOE and Syncope with Exertion   Nuclear Pre-Procedure Caffeine/Decaff Intake:  None NPO After: 8:00pm   Lungs:  Clear.  O2 SAT 98% on RA IV 0.9% NS with Angio Cath:  20g  IV Site: R Antecubital  IV Started by:  Stanton Kidney, EMT-P  Chest Size (in):  46 Cup Size: n/a  Height: 5\' 9"  (1.753 m)  Weight:  231 lb (104.781 kg)  BMI:  Body mass index is 34.11 kg/(m^2). Tech Comments:  NA    Nuclear Med Study 1 or 2 day study: 1 day  Stress Test Type:  Treadmill/Lexiscan  Reading MD: Marca Ancona, MD  Order Authorizing Provider:  Kristeen Miss, MD  Resting Radionuclide: Technetium 30m Tetrofosmin  Resting Radionuclide Dose: 11.0 mCi   Stress Radionuclide:  Technetium 20m Tetrofosmin  Stress Radionuclide Dose: 33.0 mCi           Stress Protocol Rest HR: 50 Stress HR: 78  Rest BP: Sitting  147/82  Standing  150/97 Stress BP: 147/86  Exercise Time (min): 2:00 METS: n/a   Predicted Max HR: 151 bpm % Max HR: 56.29 bpm Rate Pressure Product: 13244   Dose of Adenosine (mg):  n/a Dose of Lexiscan: 0.4 mg  Dose of Atropine (mg): n/a Dose of Dobutamine: n/a mcg/kg/min (at max HR)  Stress Test Technologist: Smiley Houseman, CMA-N  Nuclear Technologist:  Domenic Polite, CNMT     Rest Procedure:  Myocardial perfusion imaging was performed at rest 45 minutes following the intravenous administration of Technetium 7m Tetrofosmin.  Rest ECG: IRBBB with rare PAC.  Stress Procedure:  The patient received IV Lexiscan 0.4 mg over 15-seconds with  concurrent low level exercise and then Technetium 15m Tetrofosmin was injected at 30-seconds while the patient continued walking one more minute.  There were no significant changes with Lexiscan.  Quantitative spect images were obtained after a 45-minute delay.  Stress ECG: No significant change from baseline ECG  QPS Raw Data Images:  Normal; no motion artifact; normal heart/lung ratio. Stress Images:  Normal homogeneous uptake in all areas of the myocardium. Rest Images:  Normal homogeneous uptake in all areas of the myocardium. Subtraction (SDS):  There is no evidence of scar or ischemia. Transient Ischemic Dilatation (Normal <1.22):  1.03 Lung/Heart Ratio (Normal <0.45):  0.36  Quantitative Gated Spect Images QGS EDV:  115 ml QGS ESV:  50 ml QGS cine images:  NL LV Function; NL Wall Motion QGS EF: 57%  Impression Exercise Capacity:  Lexiscan with low level exercise. BP Response:  Normal blood pressure response. Clinical Symptoms:  Fatigued ECG Impression:  No significant ST segment change suggestive of ischemia. Comparison with Prior Nuclear Study: Prior study in 2010 also reported as normal.   Overall Impression:  Normal stress nuclear study.  Ronnica Dreese [employer]

## 2012-01-17 ENCOUNTER — Other Ambulatory Visit: Payer: Self-pay | Admitting: Gastroenterology

## 2012-01-17 DIAGNOSIS — D126 Benign neoplasm of colon, unspecified: Secondary | ICD-10-CM | POA: Diagnosis not present

## 2012-01-17 DIAGNOSIS — Z86010 Personal history of colonic polyps: Secondary | ICD-10-CM | POA: Diagnosis not present

## 2012-01-17 DIAGNOSIS — K5730 Diverticulosis of large intestine without perforation or abscess without bleeding: Secondary | ICD-10-CM | POA: Diagnosis not present

## 2012-01-17 DIAGNOSIS — D129 Benign neoplasm of anus and anal canal: Secondary | ICD-10-CM | POA: Diagnosis not present

## 2012-01-17 DIAGNOSIS — K648 Other hemorrhoids: Secondary | ICD-10-CM | POA: Diagnosis not present

## 2012-01-17 DIAGNOSIS — D128 Benign neoplasm of rectum: Secondary | ICD-10-CM | POA: Diagnosis not present

## 2012-01-17 DIAGNOSIS — Z09 Encounter for follow-up examination after completed treatment for conditions other than malignant neoplasm: Secondary | ICD-10-CM | POA: Diagnosis not present

## 2012-03-08 ENCOUNTER — Ambulatory Visit: Payer: BLUE CROSS/BLUE SHIELD | Admitting: Cardiovascular Disease

## 2012-03-08 ENCOUNTER — Other Ambulatory Visit: Payer: BLUE CROSS/BLUE SHIELD | Admitting: *Deleted

## 2012-03-14 ENCOUNTER — Encounter: Payer: Self-pay | Admitting: *Deleted

## 2012-03-19 ENCOUNTER — Ambulatory Visit (INDEPENDENT_AMBULATORY_CARE_PROVIDER_SITE_OTHER): Payer: Medicare Other | Admitting: Nurse Practitioner

## 2012-03-19 ENCOUNTER — Encounter: Payer: Self-pay | Admitting: Nurse Practitioner

## 2012-03-19 ENCOUNTER — Encounter: Payer: Self-pay | Admitting: Cardiovascular Disease

## 2012-03-19 VITALS — BP 128/72 | HR 56 | Ht 69.0 in | Wt 231.2 lb

## 2012-03-19 DIAGNOSIS — R55 Syncope and collapse: Secondary | ICD-10-CM | POA: Diagnosis not present

## 2012-03-19 DIAGNOSIS — R5383 Other fatigue: Secondary | ICD-10-CM

## 2012-03-19 DIAGNOSIS — R5381 Other malaise: Secondary | ICD-10-CM

## 2012-03-19 DIAGNOSIS — E785 Hyperlipidemia, unspecified: Secondary | ICD-10-CM

## 2012-03-19 DIAGNOSIS — I2510 Atherosclerotic heart disease of native coronary artery without angina pectoris: Secondary | ICD-10-CM | POA: Diagnosis not present

## 2012-03-19 LAB — BASIC METABOLIC PANEL
BUN: 16 mg/dL (ref 6–23)
CO2: 27 mEq/L (ref 19–32)
Calcium: 9.7 mg/dL (ref 8.4–10.5)
Chloride: 109 mEq/L (ref 96–112)
Creatinine, Ser: 1.3 mg/dL (ref 0.4–1.5)
GFR: 57.55 mL/min — ABNORMAL LOW (ref >60.00)
Glucose, Bld: 104 mg/dL — ABNORMAL HIGH (ref 70–99)
Potassium: 4.2 mEq/L (ref 3.5–5.1)
Sodium: 142 mEq/L (ref 135–145)

## 2012-03-19 LAB — HEPATIC FUNCTION PANEL
ALT: 22 U/L (ref 0–53)
AST: 21 U/L (ref 0–37)
Albumin: 3.9 g/dL (ref 3.5–5.2)
Alkaline Phosphatase: 50 U/L (ref 39–117)
Bilirubin, Direct: 0.1 mg/dL (ref 0.0–0.3)
Total Bilirubin: 0.2 mg/dL — ABNORMAL LOW (ref 0.3–1.2)
Total Protein: 6.5 g/dL (ref 6.0–8.3)

## 2012-03-19 LAB — LIPID PANEL
Cholesterol: 152 mg/dL (ref 0–200)
HDL: 38.9 mg/dL — ABNORMAL LOW (ref >39.00)
LDL Cholesterol: 98 mg/dL (ref 0–99)
Total CHOL/HDL Ratio: 4
Triglycerides: 77 mg/dL (ref 0.0–149.0)
VLDL: 15.4 mg/dL (ref 0.0–40.0)

## 2012-03-19 LAB — TSH: TSH: 2.91 u[IU]/mL (ref 0.35–5.50)

## 2012-03-19 NOTE — Progress Notes (Signed)
   James Barnes Date of Birth: [birth year] Medical Record #[id number]  History of Present Illness: James Barnes is seen back today for a follow up visit. He is seen for Dr. Elease Hashimoto. He has known CAD with remote CABG in 2008, negative Myoview earlier this year, HTN, HLD, tobacco abuse, and BPH. He has had a history of syncope with fairly low levels of exertion. He has had a negative echo and stress test here in February.  He comes in today. He denies chest pain. Still has these spells of near syncope. It has limited his activities somewhat. He has had 2 to 3 spells since his last visit here in the office. He does not think he is having any palpitations. He denies any loss of bowel/bladder function. He had one spell witnessed about a year ago. No seizure activity noted. He tried stopping his Altace for a couple of weeks but this did not help.  Current Outpatient Prescriptions on File Prior to Visit  Medication Sig Dispense Refill  . aspirin 325 MG tablet Take 325 mg by mouth daily.      Marland Kitchen atorvastatin (LIPITOR) 20 MG tablet Take 1 tablet (20 mg total) by mouth daily.  30 tablet  5  . Multiple Vitamins-Minerals (MULTIVITAMIN PO) Take by mouth daily.      . ramipril (ALTACE) 5 MG tablet Take 5 mg by mouth daily.      . Tamsulosin HCl (FLOMAX) 0.4 MG CAPS Take 0.4 mg by mouth daily.        No Known Allergies  Past Medical History  Diagnosis Date  . HTN (hypertension)   . Back pain   . Dyslipidemia   . CAD (coronary artery disease)     status post CABG in 2008  . Sinus bradycardia     Hx of  . Hiatal hernia   . Syncope     with negative echo/Myoview Feb 2013    Past Surgical History  Procedure Date  . Coronary artery bypass graft 2008    with a left internal mammary artery graft to his left anterior descending, right internal mammary  artery graft to his right coronary artery, and  saphenous vein graft to his obtuse marginal.   . US echocardiography 07-01-2010    EF 55-60%  .  Cardiovascular stress test 2013    Negative  . US echocardiography 2013    Normal EF; Grade 2 diastolic dysfunction    History  Smoking status  . Current Everyday Smoker  . Types: Pipe  Smokeless tobacco  . Not on file    History  Alcohol Use No    Family History  Problem Relation Age of Onset  . Heart attack Mother     Review of Systems: The review of systems is per the HPI.  All other systems were reviewed and are negative.  Physical Exam: BP 128/72  Pulse 56  Ht 5\' 9"  (1.753 m)  Wt 231 lb 3.2 oz (104.872 kg)  BMI 34.14 kg/m2 Patient is very pleasant and in no acute distress. Skin is warm and dry. Color is normal.  HEENT is unremarkable. Normocephalic/atraumatic. PERRL. Sclera are nonicteric. Neck is supple. No masses. No JVD. Lungs are coarse. Cardiac exam shows a regular rate and rhythm. Abdomen is soft. Extremities are without edema. Gait and ROM are intact. No gross neurologic deficits noted.   LABORATORY DATA:   Assessment / Plan:

## 2012-03-19 NOTE — Patient Instructions (Signed)
We are going to place a monitor on you to look at your rhythm for the next month.  We are going to check some labs today.  Call the [facility] office at [phone] if you have any questions, problems or concerns.

## 2012-03-19 NOTE — Assessment & Plan Note (Signed)
Has continued to have these spells that are exertion related. Has had a normal echo and normal stress test. We will place an event monitor today. May need CT of the head. We are rechecking fasting labs today as well. I will have Dr. Elease Hashimoto see him in 5 weeks. Patient is agreeable to this plan and will call if any problems develop in the interim.

## 2012-03-19 NOTE — Assessment & Plan Note (Signed)
No chest pain reported. Has had recent negative stress testing.

## 2012-04-25 ENCOUNTER — Encounter: Payer: Self-pay | Admitting: Cardiovascular Disease

## 2012-04-25 ENCOUNTER — Ambulatory Visit (INDEPENDENT_AMBULATORY_CARE_PROVIDER_SITE_OTHER): Payer: Medicare Other | Admitting: Cardiovascular Disease

## 2012-04-25 VITALS — BP 120/60 | HR 64 | Ht 69.0 in | Wt 232.8 lb

## 2012-04-25 DIAGNOSIS — E785 Hyperlipidemia, unspecified: Secondary | ICD-10-CM

## 2012-04-25 DIAGNOSIS — I2510 Atherosclerotic heart disease of native coronary artery without angina pectoris: Secondary | ICD-10-CM | POA: Diagnosis not present

## 2012-04-25 DIAGNOSIS — R55 Syncope and collapse: Secondary | ICD-10-CM

## 2012-04-25 MED ORDER — ATORVASTATIN CALCIUM 10 MG PO TABS: 10.0000 mg | ORAL_TABLET | Freq: Every day | ORAL | Status: DC

## 2012-04-25 NOTE — Assessment & Plan Note (Signed)
He still has episodes of syncope and presyncope that are consistent with orthostatic hypotension. He typically has these he did spend over for a while and his pants up or sits up daily. I've expressed the importance of staying hydrated. We will try stopping the ramipril completely to see if this helps. I've asked him to get regular exercise in an effort to lose weight.  I'll see him again in 3 months for followup office visit.

## 2012-04-25 NOTE — Progress Notes (Signed)
James Barnes Date of Birth  01/01/1941 [facility]     [place] [facility]  [address]    [address]   [address] [place], [place]  [address]    [place], [place]  [address] [phone]  Fax  [phone]  [phone]  Fax [phone]   Problem list: 1. Coronary artery disease-status post CABG in 2008 2. Dyslipidemia 3. Hypertension 4. Syncope  History of Present Illness:  James Barnes is a 71 year old gentleman with the above-noted medical history. He's been having some episodes of syncope recently. These episodes tend to occur with exertion.  He denies any vertigo symptoms.  He's noted that he has these episodes of syncope with fairly low levels of exertion at this point.  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    He has some occasional episodes of syncope/presyncope when he bends over and stands up very quickly.  We changed his Crestor to Lipitor at his last visit. He's still having some muscle aches but he thinks may be due to the lipitor.   Current Outpatient Prescriptions on File Prior to Visit  Medication Sig Dispense Refill  . aspirin 325 MG tablet Take 325 mg by mouth daily.      Marland Kitchen atorvastatin (LIPITOR) 20 MG tablet Take 1 tablet (20 mg total) by mouth daily.  30 tablet  5  . Multiple Vitamins-Minerals (MULTIVITAMIN PO) Take by mouth daily.      . ramipril (ALTACE) 5 MG tablet Take 5 mg by mouth daily.      . Tamsulosin HCl (FLOMAX) 0.4 MG CAPS Take 0.4 mg by mouth daily.        No Known Allergies  Past Medical History  Diagnosis Date  . HTN (hypertension)   . Back pain   . Dyslipidemia   . CAD (coronary artery disease)     status post CABG in 2008  . Sinus bradycardia     Hx of  . Hiatal hernia   . Syncope     with negative echo/Myoview Feb 2013    Past Surgical History  Procedure Date  . Coronary artery bypass graft 2008    with a left internal mammary artery graft to his  left anterior descending, right internal mammary  artery graft to his right coronary artery, and  saphenous vein graft to his obtuse marginal.   . US echocardiography 07-01-2010    EF 55-60%  . Cardiovascular stress test 2013    Negative  . US echocardiography 2013    Normal EF; Grade 2 diastolic dysfunction    History  Smoking status  . Current Everyday Smoker  . Types: Pipe  Smokeless tobacco  . Not on file    History  Alcohol Use No    Family History  Problem Relation Age of Onset  . Heart attack Mother     Reviw of Systems:  Reviewed in the HPI.  All other systems are negative.  Physical Exam: BP 120/60  Pulse 64  Ht 5\' 9"  (1.753 m)  Wt 232 lb 12.8 oz (105.597 kg)  BMI 34.38 kg/m2 The patient is alert and oriented x 3.  The mood and affect are normal.   Skin: warm and dry.  Color is normal.    HEENT:   Normocephalic/atraumatic. Normal carotids. His neck is supple. There is no JVD. Mucous membranes are moist.  Lungs: Lungs are clear   Heart: Regular rate S1-S2.  No murmurs   Abdomen: Good bowel sounds. There is no hepatosplenomegaly.  Extremities:  No clubbing cyanosis or edema.  Neuro:  Normal neuro exam. Gait is normal    ECG:  Assessment / Plan:

## 2012-04-25 NOTE — Assessment & Plan Note (Signed)
James Barnes is not having any episodes of angina. Unfortunately, we'll need to decrease his atorvastatin dose to 10 mg because of his muscle aches. We'll see him again in 3 months for followup office visit and fasting lab work.

## 2012-04-25 NOTE — Patient Instructions (Signed)
Your physician recommends that you schedule a follow-up appointment in: 3 months   Your physician has recommended you make the following change in your medication:   STOP ALTACE DUE TO LOW BLOOD PRESSURE  DECREASE ATORVASTATIN TO 10 MG DUE TO MUSCLE ACHES  Your physician recommends that you return for a FASTING lipid profile: 3 MONTHS

## 2012-08-01 ENCOUNTER — Ambulatory Visit (INDEPENDENT_AMBULATORY_CARE_PROVIDER_SITE_OTHER): Payer: Medicare Other | Admitting: Cardiovascular Disease

## 2012-08-01 ENCOUNTER — Encounter: Payer: Self-pay | Admitting: Cardiovascular Disease

## 2012-08-01 VITALS — BP 137/85 | HR 51 | Ht 69.0 in | Wt 225.8 lb

## 2012-08-01 DIAGNOSIS — I25810 Atherosclerosis of coronary artery bypass graft(s) without angina pectoris: Secondary | ICD-10-CM | POA: Diagnosis not present

## 2012-08-01 LAB — LIPID PANEL
Cholesterol: 256 mg/dL — ABNORMAL HIGH (ref 0–200)
HDL: 37.1 mg/dL — ABNORMAL LOW (ref >39.00)
Triglycerides: 141 mg/dL (ref 0.0–149.0)
VLDL: 28.2 mg/dL (ref 0.0–40.0)

## 2012-08-01 LAB — BASIC METABOLIC PANEL
Chloride: 109 mEq/L (ref 96–112)
GFR: 56.98 mL/min — ABNORMAL LOW (ref >60.00)
Potassium: 4 mEq/L (ref 3.5–5.1)

## 2012-08-01 LAB — LDL CHOLESTEROL, DIRECT: Direct LDL: 189.1 mg/dL

## 2012-08-01 LAB — HEPATIC FUNCTION PANEL
Albumin: 3.9 g/dL (ref 3.5–5.2)
Total Protein: 7 g/dL (ref 6.0–8.3)

## 2012-08-01 NOTE — Progress Notes (Addendum)
    James Barnes Date of Birth  1941/02/10 [facility]     [place] [facility]  [address]    [address]   [address] [place], [place]  [address]    [place], [place]  [address] [phone]  Fax  [phone]  [phone]  Fax [phone]   Problem list: 1. Coronary artery disease-status post CABG in 2008 2. Dyslipidemia 3. Hypertension 4. Syncope  History of Present Illness:  James Barnes is a 71 year old gentleman with the above-noted medical history.  His episodes of presyncope he has gotten out of he has stopped his Altace.  His muscle aches resolved when he stopped the Lipitor.   He has remained active. 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   Current Outpatient Prescriptions on File Prior to Visit  Medication Sig Dispense Refill  . aspirin 325 MG tablet Take 325 mg by mouth daily.      . Multiple Vitamins-Minerals (MULTIVITAMIN PO) Take by mouth daily.      . Tamsulosin HCl (FLOMAX) 0.4 MG CAPS Take 0.4 mg by mouth daily.        No Known Allergies  Past Medical History  Diagnosis Date  . HTN (hypertension)   . Back pain   . Dyslipidemia   . CAD (coronary artery disease)     status post CABG in 2008  . Sinus bradycardia     Hx of  . Hiatal hernia   . Syncope     with negative echo/Myoview Feb 2013    Past Surgical History  Procedure Date  . Coronary artery bypass graft 2008    with a left internal mammary artery graft to his left anterior descending, right internal mammary  artery graft to his right coronary artery, and  saphenous vein graft to his obtuse marginal.   . US echocardiography 07-01-2010    EF 55-60%  . Cardiovascular stress test 2013    Negative  . US echocardiography 2013    Normal EF; Grade 2 diastolic dysfunction    History  Smoking status  . Current Everyday Smoker  . Types: Pipe  Smokeless tobacco  . Not on file     History  Alcohol Use No    Family History  Problem Relation Age of Onset  . Heart attack Mother     Reviw of Systems:  Reviewed in the HPI.  All other systems are negative.  Physical Exam: BP 137/85  Pulse 51  Ht 5\' 9"  (1.753 m)  Wt 225 lb 12.8 oz (102.422 kg)  BMI 33.34 kg/m2 The patient is alert and oriented x 3.  The mood and affect are normal.   Skin: warm and dry.  Color is normal.    HEENT:   Normocephalic/atraumatic. Normal carotids. His neck is supple. There is no JVD. Mucous membranes are moist.  Lungs: Lungs are clear   Heart: Regular rate S1-S2. No murmurs   Abdomen: Good bowel sounds. There is no hepatosplenomegaly.  Extremities:  No clubbing cyanosis or edema.  Neuro:  Normal neuro exam. Gait is normal    ECG:  Assessment / Plan:

## 2012-08-01 NOTE — Assessment & Plan Note (Signed)
James Barnes is doing well. He's not had any episodes of chest pain or shortness breath. He was not able to tolerate Lipitor. I've encouraged him to exercise and watch his diet since he is not able to tolerate statins. We'll check his fasting lipids today. We will try him on Zetia if his lipids are still elevated.

## 2012-08-01 NOTE — Patient Instructions (Addendum)
Your physician wants you to follow-up in: 6 months.   You will receive a reminder letter in the mail two months in advance. If you don't receive a letter, please call our office to schedule the follow-up appointment.  Your physician recommends that you return for lab work in: today and at next visit in 6 months.  (bmet, liver, lipid)

## 2012-09-03 DIAGNOSIS — N401 Enlarged prostate with lower urinary tract symptoms: Secondary | ICD-10-CM | POA: Diagnosis not present

## 2012-09-07 ENCOUNTER — Emergency Department (HOSPITAL_COMMUNITY)
Admission: EM | Admit: 2012-09-07 | Discharge: 2012-09-07 | Disposition: A | Discharge status: Home / Self Care | Payer: Medicare Other | Attending: Emergency Medicine | Admitting: Emergency Medicine

## 2012-09-07 ENCOUNTER — Emergency Department (HOSPITAL_COMMUNITY): Payer: Medicare Other

## 2012-09-07 ENCOUNTER — Encounter (HOSPITAL_COMMUNITY): Payer: Self-pay | Admitting: Emergency Medicine

## 2012-09-07 DIAGNOSIS — I10 Essential (primary) hypertension: Secondary | ICD-10-CM | POA: Insufficient documentation

## 2012-09-07 DIAGNOSIS — M5137 Other intervertebral disc degeneration, lumbosacral region: Secondary | ICD-10-CM | POA: Diagnosis not present

## 2012-09-07 DIAGNOSIS — M62838 Other muscle spasm: Secondary | ICD-10-CM | POA: Diagnosis not present

## 2012-09-07 DIAGNOSIS — M5380 Other specified dorsopathies, site unspecified: Secondary | ICD-10-CM | POA: Diagnosis not present

## 2012-09-07 DIAGNOSIS — I2510 Atherosclerotic heart disease of native coronary artery without angina pectoris: Secondary | ICD-10-CM | POA: Diagnosis not present

## 2012-09-07 DIAGNOSIS — M479 Spondylosis, unspecified: Secondary | ICD-10-CM | POA: Diagnosis not present

## 2012-09-07 DIAGNOSIS — M47817 Spondylosis without myelopathy or radiculopathy, lumbosacral region: Secondary | ICD-10-CM | POA: Diagnosis not present

## 2012-09-07 LAB — URINALYSIS, ROUTINE W REFLEX MICROSCOPIC
Ketones, ur: NEGATIVE mg/dL
Leukocytes, UA: NEGATIVE
Nitrite: NEGATIVE
Specific Gravity, Urine: 1.023 (ref 1.005–1.030)
Urobilinogen, UA: 0.2 mg/dL (ref 0.0–1.0)
pH: 5.5 (ref 5.0–8.0)

## 2012-09-07 MED ORDER — METHOCARBAMOL 500 MG PO TABS: 500.0000 mg | ORAL_TABLET | Freq: Two times a day (BID) | ORAL | Status: DC

## 2012-09-07 MED ORDER — HYDROCODONE-ACETAMINOPHEN 5-325 MG PO TABS: 1.0000 | ORAL_TABLET | Freq: Four times a day (QID) | ORAL | Status: DC | PRN

## 2012-09-07 MED ORDER — HYDROCODONE-ACETAMINOPHEN 5-325 MG PO TABS: 1.0000 | ORAL_TABLET | Freq: Once | ORAL | Status: DC

## 2012-09-07 NOTE — ED Notes (Signed)
Gave patient a urinal to give urine sample when able.

## 2012-09-07 NOTE — ED Provider Notes (Signed)
History     CSN: [id number]  Arrival date & time 09/07/12  1610   First MD Initiated Contact with Patient 09/07/12 1714      Chief Complaint  Patient presents with  . Back Pain    (Consider location/radiation/quality/duration/timing/severity/associated sxs/prior treatment) HPI Comments: Patient with history of herniated lumbar disc presents with exacerbation of chronic lower back pain.  Pain has been worsening over the past week and became "unbearably severe" yesterday morning. Pain located at both SI joints. Symptoms are moderate. Pt describes morning stiffness and improved pain as day progresses. Movement, especially twisting, makes the pain worse. He has not tried any medicine to relieve the pain.  The pain is dull and does not radiate. Denies fever, chills, night sweats, unintentional weight loss, bladder or bowel incontinence. Denies weakness or numbness of legs. Has noted difficulty passing bowel movements since yesterday. No history of cancer or recent trauma. Patient had surgery to repair a herniated disc in his lower back in 2010, but he feels this current pain is located in a lower area of the back.  Patient is a 71 y.o. male presenting with back pain. The history is provided by the patient.  Back Pain  Pertinent negatives include no fever, no numbness, no headaches, no dysuria and no weakness.    Past Medical History  Diagnosis Date  . HTN (hypertension)   . Back pain   . Dyslipidemia   . CAD (coronary artery disease)     status post CABG in 2008  . Sinus bradycardia     Hx of  . Hiatal hernia   . Syncope     with negative echo/Myoview Feb 2013    Past Surgical History  Procedure Date  . Coronary artery bypass graft 2008    with a left internal mammary artery graft to his left anterior descending, right internal mammary  artery graft to his right coronary artery, and  saphenous vein graft to his obtuse marginal.   . US echocardiography 07-01-2010    EF 55-60%  .  Cardiovascular stress test 2013    Negative  . US echocardiography 2013    Normal EF; Grade 2 diastolic dysfunction    Family History  Problem Relation Age of Onset  . Heart attack Mother     History  Substance Use Topics  . Smoking status: Current Every Day Smoker    Types: Pipe  . Smokeless tobacco: Not on file  . Alcohol Use: No      Review of Systems  Constitutional: Negative for fever, diaphoresis, activity change and unexpected weight change.  HENT: Negative for congestion and neck pain.   Respiratory: Negative for cough.   Gastrointestinal: Positive for constipation.  Genitourinary: Negative for dysuria.  Musculoskeletal: Positive for back pain. Negative for myalgias and gait problem.  Skin: Negative for color change and wound.  Neurological: Negative for weakness, numbness and headaches.  All other systems reviewed and are negative.    Allergies  Review of patient's allergies indicates no known allergies.  Home Medications   Current Outpatient Rx  Name Route Sig Dispense Refill  . ASPIRIN 325 MG PO TABS Oral Take 325 mg by mouth daily.    . MULTIVITAMIN PO Oral Take 1 tablet by mouth daily.     Marland Kitchen TAMSULOSIN HCL 0.4 MG PO CAPS Oral Take 0.4 mg by mouth daily.      BP 150/81  Pulse 56  Temp 98.6 F (37 C) (Oral)  Resp 18  Ht 5'  9" (1.753 m)  Wt 230 lb (104.327 kg)  BMI 33.96 kg/m2  SpO2 100%  Physical Exam  Nursing note and vitals reviewed. Constitutional: He is oriented to person, place, and time. He appears well-developed and well-nourished. No distress.  HENT:  Head: Normocephalic and atraumatic.  Eyes: Conjunctivae normal and EOM are normal. Pupils are equal, round, and reactive to light. No scleral icterus.  Neck: Normal range of motion and full passive range of motion without pain. Neck supple. No spinous process tenderness and no muscular tenderness present. No rigidity. Normal range of motion present. No Brudzinski's sign noted.    Cardiovascular: Normal rate, regular rhythm and intact distal pulses.  Exam reveals no gallop and no friction rub.   No murmur heard. Pulmonary/Chest: Effort normal and breath sounds normal. No respiratory distress. He has no wheezes. He has no rales. He exhibits no tenderness.  Musculoskeletal:       Cervical back: He exhibits normal range of motion, no tenderness, no bony tenderness and no pain.       Thoracic back: He exhibits no tenderness, no bony tenderness and no pain.       Lumbar back: He exhibits decreased range of motion. He exhibits no tenderness, no bony tenderness, no pain, no spasm and normal pulse.       Right foot: He exhibits no swelling.       Left foot: He exhibits no swelling.       No tenderness to palpation over the spinous processes. Decreased flexion/extension/roation due to pain. No ttp over SI or trochanteric region. Normal pain free internal and external rotation of hips.   Neurological: He is alert and oriented to person, place, and time. He has normal strength and normal reflexes. No sensory deficit.       Sensation at baseline for light touch in all 4 distal extremities, motor symmetric & bilateral 5/5 (hips: abduction, adduction, flexion; knee: flexion & extension; foot: dorsiflexion, plantar flexion, toes: dorsi flexion) Patellar reflexes intact.   Skin: Skin is warm and dry. No rash noted. He is not diaphoretic. No erythema. No pallor.  Psychiatric: He has a normal mood and affect.    ED Course  Procedures (including critical care time)   Labs Reviewed  URINALYSIS, ROUTINE W REFLEX MICROSCOPIC   No results found.   No diagnosis found.    MDM  Paraspinal back pain, Spondylosis   Patient with back pain found to have lumbar degenerative arthritis.  No neurological deficits and normal neuro exam.  Patient can walk but states is painful.  No loss of bowel or bladder control.  No concern for cauda equina.  No fever, night sweats, weight loss, h/o cancer,  IVDU.  RICE protocol and pain medicine indicated and discussed with patient.          Jaci Carrel, [place] 09/09/12 [phone]

## 2012-09-07 NOTE — ED Provider Notes (Signed)
Patient reports he has had back pain for years and states "I'm always hurting". He states yesterday he started having a different type pain and holds his hands over the large paraspinous muscle masses along his lumbar spine bilaterally. He denies any change in activity or known injury. Patient is nontender to palpation in the midline lumbar spine however he is very tender to palpation over his paraspinous muscles which are very tight. He also has pain on flexion to left and right in those areas.His SIJ's are nontender to palpation and with ROM.  Patient states he used to see Dr. Channing Mutters however he is less and he is now going to see somebody at [facility]  Medical screening examination/treatment/procedure(s) were conducted as a shared visit with non-physician practitioner(s) and myself.  I personally evaluated the patient during the encounter  Devoria Albe, MD, Franz Dell, MD 09/07/12 [phone]

## 2012-09-07 NOTE — ED Notes (Addendum)
Reports lower back pain onset a week or more got worse yesterday morning. Patient can not lay flat in bed or turn self. However, as the day progress patient is able to move better this until yesterday. Rated pain scale 10/10. Increase with certain movement.

## 2012-09-09 NOTE — ED Provider Notes (Signed)
See prior note   Ward Givens, MD 09/09/12 0040

## 2012-09-10 DIAGNOSIS — M545 Low back pain: Secondary | ICD-10-CM | POA: Diagnosis not present

## 2012-09-18 ENCOUNTER — Other Ambulatory Visit: Payer: Self-pay | Admitting: Orthopedic Surgery

## 2012-09-18 DIAGNOSIS — M545 Low back pain: Secondary | ICD-10-CM

## 2012-09-25 ENCOUNTER — Ambulatory Visit
Admission: RE | Admit: 2012-09-25 | Discharge: 2012-09-25 | Disposition: A | Discharge status: Home / Self Care | Payer: Medicare Other | Source: Ambulatory Visit | Attending: Orthopedic Surgery | Admitting: Orthopedic Surgery

## 2012-09-25 DIAGNOSIS — M545 Low back pain: Secondary | ICD-10-CM

## 2012-09-25 MED ORDER — GADOBENATE DIMEGLUMINE 529 MG/ML IV SOLN
10.0000 mL | Freq: Once | INTRAVENOUS | Status: AC | PRN
  Administered 2012-09-25: 10 mL via INTRAVENOUS

## 2012-10-01 DIAGNOSIS — M545 Low back pain: Secondary | ICD-10-CM | POA: Diagnosis not present

## 2012-10-07 DIAGNOSIS — R7301 Impaired fasting glucose: Secondary | ICD-10-CM | POA: Diagnosis not present

## 2012-10-07 DIAGNOSIS — E785 Hyperlipidemia, unspecified: Secondary | ICD-10-CM | POA: Diagnosis not present

## 2012-10-07 DIAGNOSIS — M545 Low back pain: Secondary | ICD-10-CM | POA: Diagnosis not present

## 2012-10-07 DIAGNOSIS — I2510 Atherosclerotic heart disease of native coronary artery without angina pectoris: Secondary | ICD-10-CM | POA: Diagnosis not present

## 2012-10-07 DIAGNOSIS — Z125 Encounter for screening for malignant neoplasm of prostate: Secondary | ICD-10-CM | POA: Diagnosis not present

## 2012-10-07 DIAGNOSIS — I10 Essential (primary) hypertension: Secondary | ICD-10-CM | POA: Diagnosis not present

## 2012-10-11 DIAGNOSIS — E785 Hyperlipidemia, unspecified: Secondary | ICD-10-CM | POA: Diagnosis not present

## 2012-10-11 DIAGNOSIS — Z0000 Encounter for general adult medical examination without abnormal findings: Secondary | ICD-10-CM | POA: Diagnosis not present

## 2012-10-11 DIAGNOSIS — I10 Essential (primary) hypertension: Secondary | ICD-10-CM | POA: Diagnosis not present

## 2012-10-11 DIAGNOSIS — I25810 Atherosclerosis of coronary artery bypass graft(s) without angina pectoris: Secondary | ICD-10-CM | POA: Diagnosis not present

## 2012-10-17 ENCOUNTER — Ambulatory Visit: Payer: Medicare Other | Attending: Orthopedic Surgery

## 2012-10-17 DIAGNOSIS — IMO0001 Reserved for inherently not codable concepts without codable children: Secondary | ICD-10-CM | POA: Diagnosis not present

## 2012-10-17 DIAGNOSIS — M545 Low back pain, unspecified: Secondary | ICD-10-CM | POA: Insufficient documentation

## 2012-10-17 DIAGNOSIS — M2560 Stiffness of unspecified joint, not elsewhere classified: Secondary | ICD-10-CM | POA: Insufficient documentation

## 2012-10-22 ENCOUNTER — Ambulatory Visit: Payer: Medicare Other

## 2012-10-22 DIAGNOSIS — M545 Low back pain: Secondary | ICD-10-CM | POA: Diagnosis not present

## 2012-10-22 DIAGNOSIS — M2560 Stiffness of unspecified joint, not elsewhere classified: Secondary | ICD-10-CM | POA: Diagnosis not present

## 2012-10-22 DIAGNOSIS — IMO0001 Reserved for inherently not codable concepts without codable children: Secondary | ICD-10-CM | POA: Diagnosis not present

## 2012-10-28 ENCOUNTER — Encounter: Payer: Medicare Other | Admitting: Rehabilitation

## 2012-10-30 ENCOUNTER — Ambulatory Visit: Payer: Medicare Other | Attending: Orthopedic Surgery | Admitting: Rehabilitation

## 2012-10-30 DIAGNOSIS — M2560 Stiffness of unspecified joint, not elsewhere classified: Secondary | ICD-10-CM | POA: Insufficient documentation

## 2012-10-30 DIAGNOSIS — M545 Low back pain, unspecified: Secondary | ICD-10-CM | POA: Diagnosis not present

## 2012-10-30 DIAGNOSIS — IMO0001 Reserved for inherently not codable concepts without codable children: Secondary | ICD-10-CM | POA: Insufficient documentation

## 2012-11-04 DIAGNOSIS — M545 Low back pain: Secondary | ICD-10-CM | POA: Diagnosis not present

## 2013-03-14 ENCOUNTER — Other Ambulatory Visit: Payer: Self-pay | Admitting: *Deleted

## 2013-03-14 MED ORDER — ATORVASTATIN CALCIUM 10 MG PO TABS: 10.0000 mg | ORAL_TABLET | Freq: Every day | ORAL | Status: DC

## 2013-03-14 NOTE — Telephone Encounter (Signed)
Fax Received. Refill Completed. James Barnes (R.M.A)   

## 2013-04-04 DIAGNOSIS — R7301 Impaired fasting glucose: Secondary | ICD-10-CM | POA: Diagnosis not present

## 2013-04-04 DIAGNOSIS — N400 Enlarged prostate without lower urinary tract symptoms: Secondary | ICD-10-CM | POA: Diagnosis not present

## 2013-04-04 DIAGNOSIS — M479 Spondylosis, unspecified: Secondary | ICD-10-CM | POA: Diagnosis not present

## 2013-04-04 DIAGNOSIS — E785 Hyperlipidemia, unspecified: Secondary | ICD-10-CM | POA: Diagnosis not present

## 2013-04-04 DIAGNOSIS — I25810 Atherosclerosis of coronary artery bypass graft(s) without angina pectoris: Secondary | ICD-10-CM | POA: Diagnosis not present

## 2013-04-04 DIAGNOSIS — I10 Essential (primary) hypertension: Secondary | ICD-10-CM | POA: Diagnosis not present

## 2013-04-04 DIAGNOSIS — Z6835 Body mass index (BMI) 35.0-35.9, adult: Secondary | ICD-10-CM | POA: Diagnosis not present

## 2013-04-04 DIAGNOSIS — Z1331 Encounter for screening for depression: Secondary | ICD-10-CM | POA: Diagnosis not present

## 2013-04-24 ENCOUNTER — Other Ambulatory Visit: Payer: Self-pay | Admitting: Gastroenterology

## 2013-04-24 DIAGNOSIS — D126 Benign neoplasm of colon, unspecified: Secondary | ICD-10-CM | POA: Diagnosis not present

## 2013-04-24 DIAGNOSIS — K639 Disease of intestine, unspecified: Secondary | ICD-10-CM | POA: Diagnosis not present

## 2013-04-24 DIAGNOSIS — D1330 Benign neoplasm of unspecified part of small intestine: Secondary | ICD-10-CM | POA: Diagnosis not present

## 2013-04-24 DIAGNOSIS — K621 Rectal polyp: Secondary | ICD-10-CM | POA: Diagnosis not present

## 2013-04-24 DIAGNOSIS — Z86010 Personal history of colonic polyps: Secondary | ICD-10-CM | POA: Diagnosis not present

## 2013-04-24 DIAGNOSIS — K648 Other hemorrhoids: Secondary | ICD-10-CM | POA: Diagnosis not present

## 2013-04-24 DIAGNOSIS — K5730 Diverticulosis of large intestine without perforation or abscess without bleeding: Secondary | ICD-10-CM | POA: Diagnosis not present

## 2013-04-24 DIAGNOSIS — K620 Anal polyp: Secondary | ICD-10-CM | POA: Diagnosis not present

## 2013-04-24 DIAGNOSIS — Z09 Encounter for follow-up examination after completed treatment for conditions other than malignant neoplasm: Secondary | ICD-10-CM | POA: Diagnosis not present

## 2013-06-17 DIAGNOSIS — Z23 Encounter for immunization: Secondary | ICD-10-CM | POA: Diagnosis not present

## 2013-09-26 DIAGNOSIS — N139 Obstructive and reflux uropathy, unspecified: Secondary | ICD-10-CM | POA: Diagnosis not present

## 2013-09-26 DIAGNOSIS — N401 Enlarged prostate with lower urinary tract symptoms: Secondary | ICD-10-CM | POA: Diagnosis not present

## 2013-09-30 ENCOUNTER — Other Ambulatory Visit: Payer: Self-pay

## 2013-09-30 MED ORDER — ATORVASTATIN CALCIUM 10 MG PO TABS: 10.0000 mg | ORAL_TABLET | Freq: Every day | ORAL | Status: DC

## 2013-10-17 DIAGNOSIS — E785 Hyperlipidemia, unspecified: Secondary | ICD-10-CM | POA: Diagnosis not present

## 2013-10-17 DIAGNOSIS — I25810 Atherosclerosis of coronary artery bypass graft(s) without angina pectoris: Secondary | ICD-10-CM | POA: Diagnosis not present

## 2013-10-17 DIAGNOSIS — Z125 Encounter for screening for malignant neoplasm of prostate: Secondary | ICD-10-CM | POA: Diagnosis not present

## 2013-10-17 DIAGNOSIS — I10 Essential (primary) hypertension: Secondary | ICD-10-CM | POA: Diagnosis not present

## 2013-10-28 DIAGNOSIS — Z0000 Encounter for general adult medical examination without abnormal findings: Secondary | ICD-10-CM | POA: Diagnosis not present

## 2013-10-28 DIAGNOSIS — M479 Spondylosis, unspecified: Secondary | ICD-10-CM | POA: Diagnosis not present

## 2013-10-28 DIAGNOSIS — I25810 Atherosclerosis of coronary artery bypass graft(s) without angina pectoris: Secondary | ICD-10-CM | POA: Diagnosis not present

## 2013-10-28 DIAGNOSIS — D126 Benign neoplasm of colon, unspecified: Secondary | ICD-10-CM | POA: Diagnosis not present

## 2013-10-28 DIAGNOSIS — Z23 Encounter for immunization: Secondary | ICD-10-CM | POA: Diagnosis not present

## 2013-10-28 DIAGNOSIS — I10 Essential (primary) hypertension: Secondary | ICD-10-CM | POA: Diagnosis not present

## 2013-10-28 DIAGNOSIS — R7301 Impaired fasting glucose: Secondary | ICD-10-CM | POA: Diagnosis not present

## 2013-10-28 DIAGNOSIS — E785 Hyperlipidemia, unspecified: Secondary | ICD-10-CM | POA: Diagnosis not present

## 2013-10-28 DIAGNOSIS — N400 Enlarged prostate without lower urinary tract symptoms: Secondary | ICD-10-CM | POA: Diagnosis not present

## 2013-11-12 DIAGNOSIS — M545 Low back pain: Secondary | ICD-10-CM | POA: Diagnosis not present

## 2013-11-27 HISTORY — PX: BACK SURGERY: SHX140

## 2014-01-01 DIAGNOSIS — H2510 Age-related nuclear cataract, unspecified eye: Secondary | ICD-10-CM | POA: Diagnosis not present

## 2014-01-01 DIAGNOSIS — H524 Presbyopia: Secondary | ICD-10-CM | POA: Diagnosis not present

## 2014-01-01 DIAGNOSIS — H5210 Myopia, unspecified eye: Secondary | ICD-10-CM | POA: Diagnosis not present

## 2014-01-01 DIAGNOSIS — H52229 Regular astigmatism, unspecified eye: Secondary | ICD-10-CM | POA: Diagnosis not present

## 2014-06-10 DIAGNOSIS — J029 Acute pharyngitis, unspecified: Secondary | ICD-10-CM | POA: Diagnosis not present

## 2014-06-10 DIAGNOSIS — Z1331 Encounter for screening for depression: Secondary | ICD-10-CM | POA: Diagnosis not present

## 2014-06-10 DIAGNOSIS — E785 Hyperlipidemia, unspecified: Secondary | ICD-10-CM | POA: Diagnosis not present

## 2014-06-10 DIAGNOSIS — M479 Spondylosis, unspecified: Secondary | ICD-10-CM | POA: Diagnosis not present

## 2014-06-10 DIAGNOSIS — I25810 Atherosclerosis of coronary artery bypass graft(s) without angina pectoris: Secondary | ICD-10-CM | POA: Diagnosis not present

## 2014-06-10 DIAGNOSIS — I10 Essential (primary) hypertension: Secondary | ICD-10-CM | POA: Diagnosis not present

## 2014-06-10 DIAGNOSIS — N400 Enlarged prostate without lower urinary tract symptoms: Secondary | ICD-10-CM | POA: Diagnosis not present

## 2014-06-10 DIAGNOSIS — R7301 Impaired fasting glucose: Secondary | ICD-10-CM | POA: Diagnosis not present

## 2014-06-26 DIAGNOSIS — R51 Headache: Secondary | ICD-10-CM | POA: Diagnosis not present

## 2014-09-04 DIAGNOSIS — L821 Other seborrheic keratosis: Secondary | ICD-10-CM | POA: Diagnosis not present

## 2014-09-04 DIAGNOSIS — Z23 Encounter for immunization: Secondary | ICD-10-CM | POA: Diagnosis not present

## 2014-09-04 DIAGNOSIS — L570 Actinic keratosis: Secondary | ICD-10-CM | POA: Diagnosis not present

## 2014-09-24 ENCOUNTER — Ambulatory Visit (INDEPENDENT_AMBULATORY_CARE_PROVIDER_SITE_OTHER): Payer: Medicare Other | Admitting: Cardiovascular Disease

## 2014-09-24 ENCOUNTER — Encounter: Payer: Self-pay | Admitting: Cardiovascular Disease

## 2014-09-24 VITALS — BP 110/84 | HR 71 | Ht 69.0 in | Wt 236.0 lb

## 2014-09-24 DIAGNOSIS — J40 Bronchitis, not specified as acute or chronic: Secondary | ICD-10-CM | POA: Diagnosis not present

## 2014-09-24 DIAGNOSIS — I25810 Atherosclerosis of coronary artery bypass graft(s) without angina pectoris: Secondary | ICD-10-CM

## 2014-09-24 MED ORDER — ALBUTEROL SULFATE HFA 108 (90 BASE) MCG/ACT IN AERS: INHALATION_SPRAY | RESPIRATORY_TRACT | Status: DC

## 2014-09-24 MED ORDER — ASPIRIN EC 81 MG PO TBEC: 81.0000 mg | DELAYED_RELEASE_TABLET | Freq: Every day | ORAL | Status: AC

## 2014-09-24 NOTE — Patient Instructions (Signed)
Your physician has recommended you make the following change in your medication:  DECREASE Aspirin to 81 mg once daily START Proair inhaler 2 puffs every 6 hours for 3-4 days and then every 6 hours as needed  Your physician wants you to follow-up in: 1 year with Dr. Acie Fredrickson.  You will receive a reminder letter in the mail two months in advance. If you don't receive a letter, please call our office to schedule the follow-up appointment.

## 2014-09-24 NOTE — Assessment & Plan Note (Signed)
James Barnes has some wheezing and bronchitis Will give him Albuterol HFA ( Proair) 2 puffs every 6 hours for 3 days and then as needed.

## 2014-09-24 NOTE — Progress Notes (Signed)
James Barnes Date of Birth  11-15-1941 James Barnes  [address]    [place]   [place], [place]  [address]    [place], [place]  [address] [phone]  Fax  [phone]  [phone]  Fax [phone]   Problem list: 1. Coronary artery disease-status post CABG in 2008 2. Dyslipidemia 3. Hypertension 4. Syncope  History of Present Illness:  James Barnes is a 73 year old gentleman with the above-noted medical history.  His episodes of presyncope he has gotten out of he has stopped his Altace.  His muscle aches resolved when he stopped the Lipitor.   He has remained active. 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  Oct. 29, 2015:  James Barnes is doing well.  Still working in his wood shop.  Just finished a table and is working on a bed for his grandson.  He stopped his statin - muscle aches. He's tried Lipitor, Crestor, Zetia.  Was not able to tolerate them.     Current Outpatient Prescriptions on File Prior to Visit  Medication Sig Dispense Refill  . aspirin 325 MG tablet Take 325 mg by mouth daily.      . Multiple Vitamins-Minerals (MULTIVITAMIN PO) Take 1 tablet by mouth daily.       . Tamsulosin HCl (FLOMAX) 0.4 MG CAPS Take 0.4 mg by mouth daily.       No current facility-administered medications on file prior to visit.    No Known Allergies  Past Medical History  Diagnosis Date  . HTN (hypertension)   . Back pain   . Dyslipidemia   . CAD (coronary artery disease)     status post CABG in 2008  . Sinus bradycardia     Hx of  . Hiatal hernia   . Syncope     with negative echo/Myoview Feb 2013    Past Surgical History  Procedure Laterality Date  . Coronary artery bypass graft  2008    with a left internal mammary artery graft to his left anterior descending, right internal mammary  artery graft to his right coronary  artery, and  saphenous vein graft to his obtuse marginal.   . US echocardiography  07-01-2010    EF 55-60%  . Cardiovascular stress test  2013    Negative  . US echocardiography  2013    Normal EF; Grade 2 diastolic dysfunction    History  Smoking status  . Current Every Day Smoker  . Types: Pipe  Smokeless tobacco  . Not on file    History  Alcohol Use No    Family History  Problem Relation Age of Onset  . Heart attack Mother     Reviw of Systems:  Reviewed in the HPI.  All other systems are negative.  Physical Exam: BP 110/84  Pulse 71  Ht 5\' 9"  (1.753 m)  Wt 236 lb (107.049 kg)  BMI 34.84 kg/m2 The patient is alert and oriented x 3.  The mood and affect are normal.   Skin: warm and dry.  Color is normal.    HEENT:   Normocephalic/atraumatic. Normal carotids. His neck is supple. There is no JVD. Mucous membranes are moist.  Lungs: Lungs are clear   Heart: Regular rate S1-S2. No murmurs   Abdomen: Good bowel sounds. There is  no hepatosplenomegaly.  Extremities:  No clubbing cyanosis or edema.  Neuro:  Normal neuro exam. Gait is normal    ECG: 09/24/2014: Normal sinus rhythm at 71. Incomplete right bundle branch block. Assessment / Plan:

## 2014-09-24 NOTE — Assessment & Plan Note (Signed)
He is doing well. No angina Does not tolerate statins and has not tolerated Zetia Continue with diet and exercise We discussed diet, exercise, weight loss.  We also discussed sending him to the lipid clinic and the newer PCSK-9 inhibitors. We discussed smoking cessation Will see him in 1 year.

## 2014-09-29 DIAGNOSIS — R361 Hematospermia: Secondary | ICD-10-CM | POA: Diagnosis not present

## 2014-09-29 DIAGNOSIS — N529 Male erectile dysfunction, unspecified: Secondary | ICD-10-CM | POA: Diagnosis not present

## 2014-09-29 DIAGNOSIS — Z0000 Encounter for general adult medical examination without abnormal findings: Secondary | ICD-10-CM | POA: Diagnosis not present

## 2014-09-29 DIAGNOSIS — R350 Frequency of micturition: Secondary | ICD-10-CM | POA: Diagnosis not present

## 2014-09-29 DIAGNOSIS — N401 Enlarged prostate with lower urinary tract symptoms: Secondary | ICD-10-CM | POA: Diagnosis not present

## 2014-11-04 DIAGNOSIS — I2510 Atherosclerotic heart disease of native coronary artery without angina pectoris: Secondary | ICD-10-CM | POA: Diagnosis not present

## 2014-11-04 DIAGNOSIS — E785 Hyperlipidemia, unspecified: Secondary | ICD-10-CM | POA: Diagnosis not present

## 2014-11-04 DIAGNOSIS — I10 Essential (primary) hypertension: Secondary | ICD-10-CM | POA: Diagnosis not present

## 2014-11-04 DIAGNOSIS — R7302 Impaired glucose tolerance (oral): Secondary | ICD-10-CM | POA: Diagnosis not present

## 2014-11-04 DIAGNOSIS — Z125 Encounter for screening for malignant neoplasm of prostate: Secondary | ICD-10-CM | POA: Diagnosis not present

## 2014-11-04 DIAGNOSIS — Z0000 Encounter for general adult medical examination without abnormal findings: Secondary | ICD-10-CM | POA: Diagnosis not present

## 2014-11-11 DIAGNOSIS — I10 Essential (primary) hypertension: Secondary | ICD-10-CM | POA: Diagnosis not present

## 2014-11-11 DIAGNOSIS — E669 Obesity, unspecified: Secondary | ICD-10-CM | POA: Diagnosis not present

## 2014-11-11 DIAGNOSIS — Z0000 Encounter for general adult medical examination without abnormal findings: Secondary | ICD-10-CM | POA: Diagnosis not present

## 2014-11-11 DIAGNOSIS — F17200 Nicotine dependence, unspecified, uncomplicated: Secondary | ICD-10-CM | POA: Diagnosis not present

## 2014-11-11 DIAGNOSIS — R7302 Impaired glucose tolerance (oral): Secondary | ICD-10-CM | POA: Diagnosis not present

## 2014-11-11 DIAGNOSIS — M47896 Other spondylosis, lumbar region: Secondary | ICD-10-CM | POA: Diagnosis not present

## 2014-11-11 DIAGNOSIS — N400 Enlarged prostate without lower urinary tract symptoms: Secondary | ICD-10-CM | POA: Diagnosis not present

## 2014-11-11 DIAGNOSIS — E785 Hyperlipidemia, unspecified: Secondary | ICD-10-CM | POA: Diagnosis not present

## 2014-12-29 DIAGNOSIS — Z6836 Body mass index (BMI) 36.0-36.9, adult: Secondary | ICD-10-CM | POA: Diagnosis not present

## 2014-12-29 DIAGNOSIS — E785 Hyperlipidemia, unspecified: Secondary | ICD-10-CM | POA: Diagnosis not present

## 2015-05-12 DIAGNOSIS — R7302 Impaired glucose tolerance (oral): Secondary | ICD-10-CM | POA: Diagnosis not present

## 2015-05-12 DIAGNOSIS — Z1389 Encounter for screening for other disorder: Secondary | ICD-10-CM | POA: Diagnosis not present

## 2015-05-12 DIAGNOSIS — Z6835 Body mass index (BMI) 35.0-35.9, adult: Secondary | ICD-10-CM | POA: Diagnosis not present

## 2015-05-12 DIAGNOSIS — I519 Heart disease, unspecified: Secondary | ICD-10-CM | POA: Diagnosis not present

## 2015-05-12 DIAGNOSIS — E785 Hyperlipidemia, unspecified: Secondary | ICD-10-CM | POA: Diagnosis not present

## 2015-05-12 DIAGNOSIS — N400 Enlarged prostate without lower urinary tract symptoms: Secondary | ICD-10-CM | POA: Diagnosis not present

## 2015-05-12 DIAGNOSIS — I10 Essential (primary) hypertension: Secondary | ICD-10-CM | POA: Diagnosis not present

## 2015-05-12 DIAGNOSIS — E669 Obesity, unspecified: Secondary | ICD-10-CM | POA: Diagnosis not present

## 2015-05-12 DIAGNOSIS — I25810 Atherosclerosis of coronary artery bypass graft(s) without angina pectoris: Secondary | ICD-10-CM | POA: Diagnosis not present

## 2015-05-12 DIAGNOSIS — F17200 Nicotine dependence, unspecified, uncomplicated: Secondary | ICD-10-CM | POA: Diagnosis not present

## 2015-06-01 DIAGNOSIS — R361 Hematospermia: Secondary | ICD-10-CM | POA: Diagnosis not present

## 2015-06-01 DIAGNOSIS — R3915 Urgency of urination: Secondary | ICD-10-CM | POA: Diagnosis not present

## 2015-06-01 DIAGNOSIS — N401 Enlarged prostate with lower urinary tract symptoms: Secondary | ICD-10-CM | POA: Diagnosis not present

## 2015-06-01 DIAGNOSIS — R351 Nocturia: Secondary | ICD-10-CM | POA: Diagnosis not present

## 2015-06-01 DIAGNOSIS — N529 Male erectile dysfunction, unspecified: Secondary | ICD-10-CM | POA: Diagnosis not present

## 2015-09-24 ENCOUNTER — Ambulatory Visit (INDEPENDENT_AMBULATORY_CARE_PROVIDER_SITE_OTHER): Payer: Medicare Other | Admitting: Cardiovascular Disease

## 2015-09-24 ENCOUNTER — Encounter: Payer: Self-pay | Admitting: Cardiovascular Disease

## 2015-09-24 VITALS — BP 134/90 | HR 52 | Ht 69.0 in | Wt 229.6 lb

## 2015-09-24 DIAGNOSIS — I2510 Atherosclerotic heart disease of native coronary artery without angina pectoris: Secondary | ICD-10-CM

## 2015-09-24 DIAGNOSIS — E785 Hyperlipidemia, unspecified: Secondary | ICD-10-CM

## 2015-09-24 DIAGNOSIS — Z23 Encounter for immunization: Secondary | ICD-10-CM | POA: Diagnosis not present

## 2015-09-24 MED ORDER — EZETIMIBE 10 MG PO TABS
10.0000 mg | ORAL_TABLET | Freq: Every day | ORAL | Status: DC
Start: 2015-09-24 — End: 2016-09-26

## 2015-09-24 NOTE — Progress Notes (Signed)
[name] Date of Birth  September 26, 1941 [place]  [address]    [place]   [place], [place]  [address]    [place], [place]  [address] [phone]  Fax  [phone]  [phone]  Fax [phone]   Problem list: 1. Coronary artery disease-status post CABG in 2008 2. Dyslipidemia 3. Hypertension 4. Syncope  History of Present Illness:  James Barnes is a 74 year old gentleman with the above-noted medical history.  His episodes of presyncope he has gotten out of he has stopped his Altace.  His muscle aches resolved when he stopped the Lipitor.   He has remained active. 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  Oct. 29, 2015:  James Barnes is doing well.  Still working in his wood shop.  Just finished a table and is working on a bed for his grandson.  He stopped his statin - muscle aches. He's tried Lipitor, Crestor, Zetia.  Was not able to tolerate them.    Oct. 28, 2016:   Doing well.   No CP .  No regular exercise.  Working out in his [employer]. intolerent to statins. Has tried Zetia - did not tolerate if for some reason - he cant recall Injectable cholesterol meds were too expensive.    Current Outpatient Prescriptions on File Prior to Visit  Medication Sig Dispense Refill  . albuterol (PROVENTIL HFA;VENTOLIN HFA) 108 (90 BASE) MCG/ACT inhaler Use 2 puffs every 6 hours for 3-4 days and then every 6 hours as needed (Patient taking differently: Inhale 2 puffs into the lungs every 6 (six) hours as needed for wheezing or shortness of breath. Use 2 puffs every 6 hours for 3-4 days and then every 6 hours as needed) 1 Inhaler 2  . aspirin EC 81 MG tablet Take 1 tablet (81 mg total) by mouth daily.    . Multiple Vitamins-Minerals (MULTIVITAMIN PO) Take 1 tablet by mouth daily.     . Tamsulosin HCl (FLOMAX) 0.4 MG CAPS Take 0.4 mg by mouth  daily.     No current facility-administered medications on file prior to visit.    Allergies  Allergen Reactions  . Atorvastatin     Muscle aches   . Crestor [Rosuvastatin] Other (See Comments)    Muscle aches   . Zetia [Ezetimibe]     Muscle aches     Past Medical History  Diagnosis Date  . HTN (hypertension)   . Back pain   . Dyslipidemia   . CAD (coronary artery disease)     status post CABG in 2008  . Sinus bradycardia     Hx of  . Hiatal hernia   . Syncope     with negative echo/Myoview Feb 2013    Past Surgical History  Procedure Laterality Date  . Coronary artery bypass graft  2008    with a left internal mammary artery graft to his left anterior descending, right internal mammary  artery graft to his right coronary artery, and  saphenous vein graft to his obtuse marginal.   . US echocardiography  07-01-2010    EF 55-60%  . Cardiovascular stress test  2013    Negative  . US echocardiography  2013    Normal EF; Grade 2 diastolic dysfunction    History  Smoking status  .  Current Every Day Smoker  . Types: Pipe  Smokeless tobacco  . Not on file    History  Alcohol Use No    Family History  Problem Relation Age of Onset  . Heart attack Mother     Reviw of Systems:  Reviewed in the HPI.  All other systems are negative.  Physical Exam: BP 134/90 mmHg  Pulse 52  Ht 5\' 9"  (1.753 m)  Wt 229 lb 9.6 oz (104.146 kg)  BMI 33.89 kg/m2 The patient is alert and oriented x 3.  The mood and affect are normal.   Skin: warm and dry.  Color is normal.    HEENT:   Normocephalic/atraumatic. Normal carotids. His neck is supple. There is no JVD. Mucous membranes are moist.  Lungs: Lungs are clear   Heart: Regular rate S1-S2. No murmurs   Abdomen: Good bowel sounds. There is no hepatosplenomegaly.  Extremities:  No clubbing cyanosis or edema.  Neuro:  Normal neuro exam. Gait is normal    ECG: Sinus bradycardia 52. Right bundle branch  block  Assessment / Plan:   1. Coronary artery disease-status post CABG in 2008 Does not tolerate statins.  Has tried zetia - cannot remember what his reaction.  Could not tolerate the injectable  Does not exercise.  Still smokes a pipe Had a long discussion about risk factor reduction .  Agrees to try zetia again .   2. Dyslipidemia- we'll try Zetia again. 3. Hypertension 4. Syncope  I'll see him again in one year.    Kahmari Koller, Wonda Cheng, MD  09/24/2015 6:05 PM    [place],  [place], [place]  [address] Pager [phone] Phone: [phone]; Fax: [phone]   [facility]  [address] [place], [place]  [address] [phone]   Fax [phone]

## 2015-09-24 NOTE — Patient Instructions (Signed)
Medication Instructions:  START Zetia 10 mg once daily   Labwork: Your physician recommends that you return for lab work in: 3 months  You will need to FAST for this appointment - nothing to eat or drink after midnight the night before except water.   Testing/Procedures: None Ordered   Follow-Up: Your physician wants you to follow-up in: 1 year with Dr. Acie Fredrickson.  You will receive a reminder letter in the mail two months in advance. If you don't receive a letter, please call our office to schedule the follow-up appointment.   If you need a refill on your cardiac medications before your next appointment, please call your pharmacy.   Thank you for choosing [facility]! Christen Bame, RN [phone]

## 2015-11-18 DIAGNOSIS — R7302 Impaired glucose tolerance (oral): Secondary | ICD-10-CM | POA: Diagnosis not present

## 2015-11-18 DIAGNOSIS — Z125 Encounter for screening for malignant neoplasm of prostate: Secondary | ICD-10-CM | POA: Diagnosis not present

## 2015-11-18 DIAGNOSIS — I25810 Atherosclerosis of coronary artery bypass graft(s) without angina pectoris: Secondary | ICD-10-CM | POA: Diagnosis not present

## 2015-11-18 DIAGNOSIS — I10 Essential (primary) hypertension: Secondary | ICD-10-CM | POA: Diagnosis not present

## 2015-11-18 DIAGNOSIS — E784 Other hyperlipidemia: Secondary | ICD-10-CM | POA: Diagnosis not present

## 2015-11-25 DIAGNOSIS — E668 Other obesity: Secondary | ICD-10-CM | POA: Diagnosis not present

## 2015-11-25 DIAGNOSIS — Z0000 Encounter for general adult medical examination without abnormal findings: Secondary | ICD-10-CM | POA: Diagnosis not present

## 2015-11-25 DIAGNOSIS — I519 Heart disease, unspecified: Secondary | ICD-10-CM | POA: Diagnosis not present

## 2015-11-25 DIAGNOSIS — D126 Benign neoplasm of colon, unspecified: Secondary | ICD-10-CM | POA: Diagnosis not present

## 2015-11-25 DIAGNOSIS — N400 Enlarged prostate without lower urinary tract symptoms: Secondary | ICD-10-CM | POA: Diagnosis not present

## 2015-11-25 DIAGNOSIS — M47896 Other spondylosis, lumbar region: Secondary | ICD-10-CM | POA: Diagnosis not present

## 2015-11-25 DIAGNOSIS — Z6835 Body mass index (BMI) 35.0-35.9, adult: Secondary | ICD-10-CM | POA: Diagnosis not present

## 2015-11-25 DIAGNOSIS — R7302 Impaired glucose tolerance (oral): Secondary | ICD-10-CM | POA: Diagnosis not present

## 2015-11-25 DIAGNOSIS — E7800 Pure hypercholesterolemia, unspecified: Secondary | ICD-10-CM | POA: Diagnosis not present

## 2015-11-25 DIAGNOSIS — I25810 Atherosclerosis of coronary artery bypass graft(s) without angina pectoris: Secondary | ICD-10-CM | POA: Diagnosis not present

## 2015-11-25 DIAGNOSIS — I10 Essential (primary) hypertension: Secondary | ICD-10-CM | POA: Diagnosis not present

## 2015-11-25 DIAGNOSIS — F17200 Nicotine dependence, unspecified, uncomplicated: Secondary | ICD-10-CM | POA: Diagnosis not present

## 2015-11-28 DIAGNOSIS — D049 Carcinoma in situ of skin, unspecified: Secondary | ICD-10-CM

## 2015-11-28 HISTORY — DX: Carcinoma in situ of skin, unspecified: D04.9

## 2015-12-07 DIAGNOSIS — R350 Frequency of micturition: Secondary | ICD-10-CM | POA: Diagnosis not present

## 2015-12-07 DIAGNOSIS — Z0000 Encounter for general adult medical examination without abnormal findings: Secondary | ICD-10-CM | POA: Diagnosis not present

## 2015-12-27 ENCOUNTER — Other Ambulatory Visit: Payer: BLUE CROSS/BLUE SHIELD | Admitting: *Deleted

## 2015-12-27 NOTE — Addendum Note (Signed)
Addended by: Eulis Foster on: 12/27/2015 07:35 AM   Modules accepted: Orders

## 2016-01-04 DIAGNOSIS — Z1212 Encounter for screening for malignant neoplasm of rectum: Secondary | ICD-10-CM | POA: Diagnosis not present

## 2016-01-14 DIAGNOSIS — H43813 Vitreous degeneration, bilateral: Secondary | ICD-10-CM | POA: Diagnosis not present

## 2016-01-14 DIAGNOSIS — H43393 Other vitreous opacities, bilateral: Secondary | ICD-10-CM | POA: Diagnosis not present

## 2016-03-15 DIAGNOSIS — N401 Enlarged prostate with lower urinary tract symptoms: Secondary | ICD-10-CM | POA: Diagnosis not present

## 2016-03-15 DIAGNOSIS — Z0000 Encounter for general adult medical examination without abnormal findings: Secondary | ICD-10-CM | POA: Diagnosis not present

## 2016-05-01 DIAGNOSIS — L739 Follicular disorder, unspecified: Secondary | ICD-10-CM | POA: Diagnosis not present

## 2016-05-01 DIAGNOSIS — M545 Low back pain: Secondary | ICD-10-CM | POA: Diagnosis not present

## 2016-05-01 DIAGNOSIS — Z6836 Body mass index (BMI) 36.0-36.9, adult: Secondary | ICD-10-CM | POA: Diagnosis not present

## 2016-05-18 DIAGNOSIS — Z85828 Personal history of other malignant neoplasm of skin: Secondary | ICD-10-CM | POA: Diagnosis not present

## 2016-05-18 DIAGNOSIS — C4442 Squamous cell carcinoma of skin of scalp and neck: Secondary | ICD-10-CM | POA: Diagnosis not present

## 2016-05-18 DIAGNOSIS — C44329 Squamous cell carcinoma of skin of other parts of face: Secondary | ICD-10-CM | POA: Diagnosis not present

## 2016-05-24 DIAGNOSIS — I25810 Atherosclerosis of coronary artery bypass graft(s) without angina pectoris: Secondary | ICD-10-CM | POA: Diagnosis not present

## 2016-05-24 DIAGNOSIS — I1310 Hypertensive heart and chronic kidney disease without heart failure, with stage 1 through stage 4 chronic kidney disease, or unspecified chronic kidney disease: Secondary | ICD-10-CM | POA: Diagnosis not present

## 2016-05-24 DIAGNOSIS — N183 Chronic kidney disease, stage 3 (moderate): Secondary | ICD-10-CM | POA: Diagnosis not present

## 2016-05-24 DIAGNOSIS — E668 Other obesity: Secondary | ICD-10-CM | POA: Diagnosis not present

## 2016-05-24 DIAGNOSIS — E7800 Pure hypercholesterolemia, unspecified: Secondary | ICD-10-CM | POA: Diagnosis not present

## 2016-05-24 DIAGNOSIS — Z6835 Body mass index (BMI) 35.0-35.9, adult: Secondary | ICD-10-CM | POA: Diagnosis not present

## 2016-05-24 DIAGNOSIS — I519 Heart disease, unspecified: Secondary | ICD-10-CM | POA: Diagnosis not present

## 2016-05-24 DIAGNOSIS — N400 Enlarged prostate without lower urinary tract symptoms: Secondary | ICD-10-CM | POA: Diagnosis not present

## 2016-05-24 DIAGNOSIS — D692 Other nonthrombocytopenic purpura: Secondary | ICD-10-CM | POA: Diagnosis not present

## 2016-05-24 DIAGNOSIS — R7302 Impaired glucose tolerance (oral): Secondary | ICD-10-CM | POA: Diagnosis not present

## 2016-05-24 DIAGNOSIS — I10 Essential (primary) hypertension: Secondary | ICD-10-CM | POA: Diagnosis not present

## 2016-05-24 DIAGNOSIS — M47896 Other spondylosis, lumbar region: Secondary | ICD-10-CM | POA: Diagnosis not present

## 2016-07-28 DIAGNOSIS — H5213 Myopia, bilateral: Secondary | ICD-10-CM | POA: Diagnosis not present

## 2016-07-28 DIAGNOSIS — H2513 Age-related nuclear cataract, bilateral: Secondary | ICD-10-CM | POA: Diagnosis not present

## 2016-08-23 DIAGNOSIS — L918 Other hypertrophic disorders of the skin: Secondary | ICD-10-CM | POA: Diagnosis not present

## 2016-08-23 DIAGNOSIS — Z85828 Personal history of other malignant neoplasm of skin: Secondary | ICD-10-CM | POA: Diagnosis not present

## 2016-09-14 DIAGNOSIS — R351 Nocturia: Secondary | ICD-10-CM | POA: Diagnosis not present

## 2016-09-14 DIAGNOSIS — N401 Enlarged prostate with lower urinary tract symptoms: Secondary | ICD-10-CM | POA: Diagnosis not present

## 2016-09-14 DIAGNOSIS — R350 Frequency of micturition: Secondary | ICD-10-CM | POA: Diagnosis not present

## 2016-09-14 DIAGNOSIS — N529 Male erectile dysfunction, unspecified: Secondary | ICD-10-CM | POA: Diagnosis not present

## 2016-09-26 ENCOUNTER — Encounter (INDEPENDENT_AMBULATORY_CARE_PROVIDER_SITE_OTHER): Payer: Self-pay

## 2016-09-26 ENCOUNTER — Ambulatory Visit (INDEPENDENT_AMBULATORY_CARE_PROVIDER_SITE_OTHER): Payer: Medicare Other | Admitting: Cardiovascular Disease

## 2016-09-26 ENCOUNTER — Encounter: Payer: Self-pay | Admitting: Cardiovascular Disease

## 2016-09-26 VITALS — BP 140/78 | HR 51 | Ht 69.0 in | Wt 243.4 lb

## 2016-09-26 DIAGNOSIS — I2510 Atherosclerotic heart disease of native coronary artery without angina pectoris: Secondary | ICD-10-CM

## 2016-09-26 DIAGNOSIS — Z23 Encounter for immunization: Secondary | ICD-10-CM

## 2016-09-26 DIAGNOSIS — E1169 Type 2 diabetes mellitus with other specified complication: Secondary | ICD-10-CM | POA: Insufficient documentation

## 2016-09-26 DIAGNOSIS — I10 Essential (primary) hypertension: Secondary | ICD-10-CM | POA: Diagnosis not present

## 2016-09-26 MED ORDER — LOSARTAN POTASSIUM 50 MG PO TABS: 50.0000 mg | ORAL_TABLET | Freq: Every day | ORAL | 11 refills | Status: DC

## 2016-09-26 NOTE — Progress Notes (Signed)
James Barnes Date of Birth  09-25-1941 [place]  [id number]    [place]   [place], [place]  [address]    [place], [place]  [address] [phone]  Fax  [phone]  [phone]  Fax [phone]   Problem list: 1. Coronary artery disease-status post CABG in 2008 2. Dyslipidemia 3. Hypertension 4. Syncope  Previous notes.   James Barnes is a 75 year old gentleman with the above-noted medical history.  His episodes of presyncope he has gotten out of he has stopped his Altace.  His muscle aches resolved when he stopped the Lipitor.   He has remained active. 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  Oct. 29, 2015:  James Barnes is doing well.  Still working in his wood shop.  Just finished a table and is working on a bed for his grandson.  He stopped his statin - muscle aches. He's tried Lipitor, Crestor, Zetia.  Was not able to tolerate them.    Oct. 28, 2016:   Doing well.   No CP .  No regular exercise.  Working out in his [employer]. intolerent to statins. Has tried Zetia - did not tolerate if for some reason - he cant recall Injectable cholesterol meds were too expensive.   Oct. 31, 2017:  Doing well. No CP or dyspnea. Working in his [place] smokes his pipe,  Advised cessation.   Does not check his BP  Does not limit his salt intake   Current Outpatient Prescriptions on File Prior to Visit  Medication Sig Dispense Refill  . albuterol (PROVENTIL HFA;VENTOLIN HFA) 108 (90 BASE) MCG/ACT inhaler Use 2 puffs every 6 hours for 3-4 days and then every 6 hours as needed (Patient taking differently: Inhale 2 puffs into the lungs every 6 (six) hours as needed for wheezing or shortness of breath. Use 2 puffs every 6 hours for 3-4 days and then every 6 hours as needed) 1 Inhaler 2  . aspirin EC 81 MG tablet Take 1 tablet (81  mg total) by mouth daily.    . Tamsulosin HCl (FLOMAX) 0.4 MG CAPS Take 0.4 mg by mouth daily.     No current facility-administered medications on file prior to visit.     Allergies  Allergen Reactions  . Atorvastatin     Muscle aches   . Crestor [Rosuvastatin] Other (See Comments)    Muscle aches   . Zetia [Ezetimibe]     Muscle aches     Past Medical History:  Diagnosis Date  . Back pain   . CAD (coronary artery disease)    status post CABG in 2008  . Dyslipidemia   . Hiatal hernia   . HTN (hypertension)   . Sinus bradycardia    Hx of  . Syncope    with negative echo/Myoview Feb 2013    Past Surgical History:  Procedure Laterality Date  . CARDIOVASCULAR STRESS TEST  2013   Negative  . CORONARY ARTERY BYPASS GRAFT  2008   with a left internal mammary artery graft to his left anterior descending, right internal mammary  artery graft to his right coronary artery, and  saphenous vein graft to his obtuse marginal.   . US ECHOCARDIOGRAPHY  07-01-2010   EF 55-60%  . US ECHOCARDIOGRAPHY  2013  Normal EF; Grade 2 diastolic dysfunction    History  Smoking Status  . Current Every Day Smoker  . Types: Pipe  Smokeless Tobacco  . Not on file    History  Alcohol Use No    Family History  Problem Relation Age of Onset  . Heart attack Mother     Reviw of Systems:  Reviewed in the HPI.  All other systems are negative.  Physical Exam: BP 140/78 (BP Location: Left Arm, Patient Position: Sitting, Cuff Size: Large)   Pulse (!) 51   Ht 5\' 9"  (1.753 m)   Wt 243 lb 6.4 oz (110.4 kg)   BMI 35.94 kg/m  The patient is alert and oriented x 3.  The mood and affect are normal.   Skin: warm and dry.  Color is normal.   HEENT:   Normocephalic/atraumatic. Normal carotids. His neck is supple. There is no JVD. Mucous membranes are moist. Lungs: Lungs are clear  Heart: Regular rate S1-S2. No murmurs  Abdomen: Good bowel sounds. There is no hepatosplenomegaly. Extremities:   No clubbing cyanosis or edema. Neuro:  Normal neuro exam. Gait is normal    ECG: Sinus bradycardia 51. Right bundle branch block  Assessment / Plan:   1. Coronary artery disease-status post CABG in 2008 Does not tolerate statins in high doses.   He has been able to tolerate atorvastatin 10 mg a day .  Has tried zetia - cannot remember what his reaction.  Could not tolerate the injectable  Does not exercise.  Still smokes a pipe Had a long discussion about risk factor reduction .     2. Dyslipidemia-  On atorvastatin 10 ,   Check in 3 weeks.   3. Hypertension - will start Losartan 50 mg a day . Advised him to limit his salt.   I'll see him again in one year.    Mertie Moores, MD  09/26/2016 9:51 AM    [place],  [place], [place]  [address] Pager [phone] Phone: [phone]; Fax: [phone]   [facility]  [address] [place], [place]  [address] [phone]   Fax [phone]

## 2016-09-26 NOTE — Patient Instructions (Addendum)
Medication Instructions:  START Losartan 50 mg once daily   Labwork: Your physician recommends that you return for lab work in:  3 weeks You will need to FAST for this appointment - nothing to eat or drink after midnight the night before except water.   Testing/Procedures: None Ordered   Follow-Up: Your physician wants you to follow-up in: 1 year with Dr. Acie Fredrickson.  You will receive a reminder letter in the mail two months in advance. If you don't receive a letter, please call our office to schedule the follow-up appointment.   If you need a refill on your cardiac medications before your next appointment, please call your pharmacy.   Thank you for choosing [facility]! Christen Bame, RN [phone]

## 2016-10-16 ENCOUNTER — Other Ambulatory Visit: Payer: Medicare Other | Admitting: *Deleted

## 2016-10-16 DIAGNOSIS — I2510 Atherosclerotic heart disease of native coronary artery without angina pectoris: Secondary | ICD-10-CM | POA: Diagnosis not present

## 2016-10-16 DIAGNOSIS — I10 Essential (primary) hypertension: Secondary | ICD-10-CM

## 2016-10-16 LAB — COMPREHENSIVE METABOLIC PANEL
ALT: 16 U/L (ref 9–46)
AST: 16 U/L (ref 10–35)
Albumin: 3.7 g/dL (ref 3.6–5.1)
Alkaline Phosphatase: 57 U/L (ref 40–115)
BUN: 16 mg/dL (ref 7–25)
CHLORIDE: 108 mmol/L (ref 98–110)
CO2: 27 mmol/L (ref 20–31)
CREATININE: 1.39 mg/dL — AB (ref 0.70–1.18)
Calcium: 9.2 mg/dL (ref 8.6–10.3)
GLUCOSE: 122 mg/dL — AB (ref 65–99)
POTASSIUM: 4.4 mmol/L (ref 3.5–5.3)
SODIUM: 141 mmol/L (ref 135–146)
Total Bilirubin: 0.3 mg/dL (ref 0.2–1.2)
Total Protein: 6 g/dL — ABNORMAL LOW (ref 6.1–8.1)

## 2016-10-16 LAB — LIPID PANEL
CHOL/HDL RATIO: 5 ratio — AB (ref <5.0)
Cholesterol: 155 mg/dL (ref <200)
HDL: 31 mg/dL — ABNORMAL LOW (ref >40)
LDL Cholesterol: 92 mg/dL (ref <100)
Triglycerides: 160 mg/dL — ABNORMAL HIGH (ref <150)
VLDL: 32 mg/dL — AB (ref <30)

## 2016-11-29 DIAGNOSIS — R7302 Impaired glucose tolerance (oral): Secondary | ICD-10-CM | POA: Diagnosis not present

## 2016-11-29 DIAGNOSIS — Z125 Encounter for screening for malignant neoplasm of prostate: Secondary | ICD-10-CM | POA: Diagnosis not present

## 2016-11-29 DIAGNOSIS — E7800 Pure hypercholesterolemia, unspecified: Secondary | ICD-10-CM | POA: Diagnosis not present

## 2016-11-29 DIAGNOSIS — I10 Essential (primary) hypertension: Secondary | ICD-10-CM | POA: Diagnosis not present

## 2016-12-05 DIAGNOSIS — N183 Chronic kidney disease, stage 3 (moderate): Secondary | ICD-10-CM | POA: Diagnosis not present

## 2016-12-05 DIAGNOSIS — D692 Other nonthrombocytopenic purpura: Secondary | ICD-10-CM | POA: Diagnosis not present

## 2016-12-05 DIAGNOSIS — E668 Other obesity: Secondary | ICD-10-CM | POA: Diagnosis not present

## 2016-12-05 DIAGNOSIS — I519 Heart disease, unspecified: Secondary | ICD-10-CM | POA: Diagnosis not present

## 2016-12-05 DIAGNOSIS — D126 Benign neoplasm of colon, unspecified: Secondary | ICD-10-CM | POA: Diagnosis not present

## 2016-12-05 DIAGNOSIS — M47896 Other spondylosis, lumbar region: Secondary | ICD-10-CM | POA: Diagnosis not present

## 2016-12-05 DIAGNOSIS — Z6836 Body mass index (BMI) 36.0-36.9, adult: Secondary | ICD-10-CM | POA: Diagnosis not present

## 2016-12-05 DIAGNOSIS — Z1389 Encounter for screening for other disorder: Secondary | ICD-10-CM | POA: Diagnosis not present

## 2016-12-05 DIAGNOSIS — I25810 Atherosclerosis of coronary artery bypass graft(s) without angina pectoris: Secondary | ICD-10-CM | POA: Diagnosis not present

## 2016-12-05 DIAGNOSIS — F17200 Nicotine dependence, unspecified, uncomplicated: Secondary | ICD-10-CM | POA: Diagnosis not present

## 2016-12-05 DIAGNOSIS — I1310 Hypertensive heart and chronic kidney disease without heart failure, with stage 1 through stage 4 chronic kidney disease, or unspecified chronic kidney disease: Secondary | ICD-10-CM | POA: Diagnosis not present

## 2016-12-05 DIAGNOSIS — Z0000 Encounter for general adult medical examination without abnormal findings: Secondary | ICD-10-CM | POA: Diagnosis not present

## 2016-12-28 DIAGNOSIS — D2262 Melanocytic nevi of left upper limb, including shoulder: Secondary | ICD-10-CM | POA: Diagnosis not present

## 2016-12-28 DIAGNOSIS — L820 Inflamed seborrheic keratosis: Secondary | ICD-10-CM | POA: Diagnosis not present

## 2016-12-28 DIAGNOSIS — L821 Other seborrheic keratosis: Secondary | ICD-10-CM | POA: Diagnosis not present

## 2016-12-28 DIAGNOSIS — L570 Actinic keratosis: Secondary | ICD-10-CM | POA: Diagnosis not present

## 2016-12-28 DIAGNOSIS — L814 Other melanin hyperpigmentation: Secondary | ICD-10-CM | POA: Diagnosis not present

## 2016-12-28 DIAGNOSIS — D1801 Hemangioma of skin and subcutaneous tissue: Secondary | ICD-10-CM | POA: Diagnosis not present

## 2016-12-28 DIAGNOSIS — D225 Melanocytic nevi of trunk: Secondary | ICD-10-CM | POA: Diagnosis not present

## 2016-12-28 DIAGNOSIS — Z85828 Personal history of other malignant neoplasm of skin: Secondary | ICD-10-CM | POA: Diagnosis not present

## 2017-07-19 DIAGNOSIS — Z6835 Body mass index (BMI) 35.0-35.9, adult: Secondary | ICD-10-CM | POA: Diagnosis not present

## 2017-07-19 DIAGNOSIS — M79672 Pain in left foot: Secondary | ICD-10-CM | POA: Diagnosis not present

## 2017-09-21 DIAGNOSIS — Z23 Encounter for immunization: Secondary | ICD-10-CM | POA: Diagnosis not present

## 2017-09-26 DIAGNOSIS — N401 Enlarged prostate with lower urinary tract symptoms: Secondary | ICD-10-CM | POA: Diagnosis not present

## 2017-09-26 DIAGNOSIS — R350 Frequency of micturition: Secondary | ICD-10-CM | POA: Diagnosis not present

## 2017-09-26 DIAGNOSIS — N5201 Erectile dysfunction due to arterial insufficiency: Secondary | ICD-10-CM | POA: Diagnosis not present

## 2017-10-09 DIAGNOSIS — Z6836 Body mass index (BMI) 36.0-36.9, adult: Secondary | ICD-10-CM | POA: Diagnosis not present

## 2017-10-09 DIAGNOSIS — N6049 Mammary duct ectasia of unspecified breast: Secondary | ICD-10-CM | POA: Diagnosis not present

## 2017-10-10 ENCOUNTER — Other Ambulatory Visit: Payer: Self-pay | Admitting: Cardiovascular Disease

## 2017-11-16 DIAGNOSIS — L739 Follicular disorder, unspecified: Secondary | ICD-10-CM | POA: Diagnosis not present

## 2017-11-16 DIAGNOSIS — Z85828 Personal history of other malignant neoplasm of skin: Secondary | ICD-10-CM | POA: Diagnosis not present

## 2017-11-16 DIAGNOSIS — D485 Neoplasm of uncertain behavior of skin: Secondary | ICD-10-CM | POA: Diagnosis not present

## 2017-11-16 DIAGNOSIS — L570 Actinic keratosis: Secondary | ICD-10-CM | POA: Diagnosis not present

## 2017-11-16 DIAGNOSIS — L578 Other skin changes due to chronic exposure to nonionizing radiation: Secondary | ICD-10-CM | POA: Diagnosis not present

## 2017-11-21 DIAGNOSIS — E1169 Type 2 diabetes mellitus with other specified complication: Secondary | ICD-10-CM | POA: Insufficient documentation

## 2017-11-21 DIAGNOSIS — E785 Hyperlipidemia, unspecified: Secondary | ICD-10-CM | POA: Insufficient documentation

## 2017-11-21 DIAGNOSIS — E1159 Type 2 diabetes mellitus with other circulatory complications: Secondary | ICD-10-CM | POA: Insufficient documentation

## 2017-11-21 DIAGNOSIS — R001 Bradycardia, unspecified: Secondary | ICD-10-CM | POA: Insufficient documentation

## 2017-11-21 DIAGNOSIS — I10 Essential (primary) hypertension: Secondary | ICD-10-CM | POA: Insufficient documentation

## 2017-11-21 DIAGNOSIS — K449 Diaphragmatic hernia without obstruction or gangrene: Secondary | ICD-10-CM | POA: Insufficient documentation

## 2017-12-10 ENCOUNTER — Ambulatory Visit (INDEPENDENT_AMBULATORY_CARE_PROVIDER_SITE_OTHER): Payer: Medicare Other | Admitting: Cardiovascular Disease

## 2017-12-10 ENCOUNTER — Encounter: Payer: Self-pay | Admitting: Cardiovascular Disease

## 2017-12-10 VITALS — BP 140/88 | HR 63 | Ht 69.0 in | Wt 242.0 lb

## 2017-12-10 DIAGNOSIS — I2510 Atherosclerotic heart disease of native coronary artery without angina pectoris: Secondary | ICD-10-CM | POA: Diagnosis not present

## 2017-12-10 DIAGNOSIS — E782 Mixed hyperlipidemia: Secondary | ICD-10-CM | POA: Diagnosis not present

## 2017-12-10 DIAGNOSIS — I10 Essential (primary) hypertension: Secondary | ICD-10-CM

## 2017-12-10 LAB — HEPATIC FUNCTION PANEL
ALBUMIN: 4.1 g/dL (ref 3.5–4.8)
ALK PHOS: 67 IU/L (ref 39–117)
ALT: 19 IU/L (ref 0–44)
AST: 15 IU/L (ref 0–40)
BILIRUBIN TOTAL: 0.2 mg/dL (ref 0.0–1.2)
Bilirubin, Direct: 0.07 mg/dL (ref 0.00–0.40)
TOTAL PROTEIN: 6.3 g/dL (ref 6.0–8.5)

## 2017-12-10 LAB — BASIC METABOLIC PANEL
BUN/Creatinine Ratio: 12 (ref 10–24)
BUN: 17 mg/dL (ref 8–27)
CO2: 23 mmol/L (ref 20–29)
CREATININE: 1.41 mg/dL — AB (ref 0.76–1.27)
Calcium: 9.7 mg/dL (ref 8.6–10.2)
Chloride: 106 mmol/L (ref 96–106)
GFR calc Af Amer: 56 mL/min/{1.73_m2} — ABNORMAL LOW (ref >59)
GFR, EST NON AFRICAN AMERICAN: 48 mL/min/{1.73_m2} — AB (ref >59)
GLUCOSE: 116 mg/dL — AB (ref 65–99)
Potassium: 4.1 mmol/L (ref 3.5–5.2)
SODIUM: 142 mmol/L (ref 134–144)

## 2017-12-10 LAB — LIPID PANEL
Chol/HDL Ratio: 4.8 ratio (ref 0.0–5.0)
Cholesterol, Total: 181 mg/dL (ref 100–199)
HDL: 38 mg/dL — AB (ref >39)
LDL CALC: 119 mg/dL — AB (ref 0–99)
TRIGLYCERIDES: 121 mg/dL (ref 0–149)
VLDL Cholesterol Cal: 24 mg/dL (ref 5–40)

## 2017-12-10 MED ORDER — HYDROCHLOROTHIAZIDE 25 MG PO TABS: 25.0000 mg | ORAL_TABLET | Freq: Every day | ORAL | 3 refills | Status: DC

## 2017-12-10 MED ORDER — POTASSIUM CHLORIDE ER 10 MEQ PO TBCR: 10.0000 meq | EXTENDED_RELEASE_TABLET | Freq: Every day | ORAL | 3 refills | Status: DC

## 2017-12-10 NOTE — Progress Notes (Signed)
James Barnes Date of Birth  Oct 07, 1941          Problem list: 1. Coronary artery disease-status post CABG in 2008 2. Dyslipidemia 3. Hypertension 4. Syncope  Previous notes.   James Barnes is a 77 year old gentleman with the above-noted medical history.  His episodes of presyncope he has gotten out of he has stopped his Altace.  His muscle aches resolved when he stopped the Lipitor.   He has remained active. 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  Oct. 29, 2015:  James Barnes is doing well.  Still working in his wood shop.  Just finished a table and is working on a bed for his grandson.  He stopped his statin - muscle aches. He's tried Lipitor, Crestor, Zetia.  Was not able to tolerate them.    Oct. 28, 2016:   Doing well.   No CP .  No regular exercise.  Working out in his [employer]. intolerent to statins. Has tried Zetia - did not tolerate if for some reason - he cant recall Injectable cholesterol meds were too expensive.   Oct. 31, 2017:  Doing well. No CP or dyspnea. Working in his [place] smokes his pipe,  Advised cessation.   December 10, 2017:    Does not check his BP  Does not limit his salt intake  No CP or dyspnea. Still smokes a pipe.  Does not exercise.   Looking into getting a 3 wheeled bike.  Has hip issues so does not walk  - still eats some salty foods -  Has been eating country ham recently .  Also has gained some weight   Current Outpatient Medications on File Prior to Visit  Medication Sig Dispense Refill  . albuterol (PROVENTIL HFA;VENTOLIN HFA) 108 (90 BASE) MCG/ACT inhaler Use 2 puffs every 6 hours for 3-4 days and then every 6 hours as needed (Patient taking differently: Inhale 2 puffs into the lungs every 6 (six) hours as needed for wheezing or shortness of breath. Use 2 puffs every 6 hours for 3-4 days and then every 6 hours as needed) 1  Inhaler 2  . aspirin EC 81 MG tablet Take 1 tablet (81 mg total) by mouth daily.    Marland Kitchen atorvastatin (LIPITOR) 10 MG tablet Take 10 mg by mouth daily.  2  . losartan (COZAAR) 50 MG tablet TAKE 1 TABLET BY MOUTH DAILY. 30 tablet 1  . Tamsulosin HCl (FLOMAX) 0.4 MG CAPS Take 0.4 mg by mouth daily.     No current facility-administered medications on file prior to visit.     Allergies  Allergen Reactions  . Atorvastatin     Muscle aches   . Crestor [Rosuvastatin] Other (See Comments)    Muscle aches   . Zetia [Ezetimibe]     Muscle aches     Past Medical History:  Diagnosis Date  . Back pain   . CAD (coronary artery disease)    status post CABG in 2008  . Dyslipidemia   . Hiatal hernia   . HTN (hypertension)   . Sinus bradycardia    Hx of  . Syncope    with negative echo/Myoview Feb 2013    Past Surgical History:  Procedure Laterality Date  . CARDIOVASCULAR STRESS TEST  2013   Negative  . CORONARY ARTERY BYPASS GRAFT  2008   with a left internal mammary artery graft to his left anterior descending, right internal mammary  artery graft to his right coronary artery, and  saphenous vein graft to his obtuse marginal.   . US ECHOCARDIOGRAPHY  07-01-2010   EF 55-60%  . US ECHOCARDIOGRAPHY  2013   Normal EF; Grade 2 diastolic dysfunction    Social History   Tobacco Use  Smoking Status Current Every Day Smoker  . Types: Pipe  Smokeless Tobacco Never Used    Social History   Substance and Sexual Activity  Alcohol Use No    Family History  Problem Relation Age of Onset  . Heart attack Mother     Reviw of Systems:  Noted in current history.  Otherwise his systems are negative.  Physical Exam: Blood pressure 140/88, pulse 63, height 5\' 9"  (1.753 m), weight 242 lb (109.8 kg), SpO2 98 %.  GEN:  midly obese , elderly male.   HEENT: Normal NECK: No JVD; No carotid bruits LYMPHATICS: No lymphadenopathy CARDIAC: RR , normal S1S2  RESPIRATORY:  Clear to auscultation  without rales, wheezing or rhonchi  ABDOMEN: Soft, non-tender, non-distended MUSCULOSKELETAL:  No edema; No deformity  SKIN: Warm and dry NEUROLOGIC:  Alert and oriented x 3   ECG: December 10, 2017: Normal sinus rhythm at 63.  Occasional premature ventricular contractions.  Right bundle branch block.  Assessment / Plan:   1. Coronary artery disease- .  Does not exercise Has not cut back on smoking.  Has gained weight -  Does not limit his salt intake.  Does not make much of an effort to help himself Advised him that he would do better if he took better care of himself.   2. Dyslipidemia-    Tolerates only low dose Atorvastatin .   Have not been able to get him other meds  Check labs today   3. Hypertension -    BP is elevated.   He does not limit his salt intake and does not check his blood pressure. I recommended he start exercising and cut back on his salt.  We will add HCTZ 25 mg a day and potassium chloride 10 mg a day.  We will check a basic metabolic profile in 3 weeks.  See him again in 6 months.  Mertie Moores, MD  12/10/2017 8:29 AM    [place],  [place], [place]  [address] Pager [phone] Phone: [phone]; Fax: [phone]

## 2017-12-10 NOTE — Patient Instructions (Signed)
Medication Instructions:  Your physician has recommended you make the following change in your medication: START HCTZ (Hydrochlorothiazide) 25 mg once daily START Kdur (Potassium supplement) 10 meq once daily   Labwork: TODAY - Cholesterol, liver panel, basic metabolic panel  Your physician recommends that you return for lab work in: 3 weeks for basic metabolic panel   Testing/Procedures: None Ordered   Follow-Up: Your physician wants you to follow-up in: 6 months with Dr. Acie Fredrickson.  You will receive a reminder letter in the mail two months in advance. If you don't receive a letter, please call our office to schedule the follow-up appointment.   If you need a refill on your cardiac medications before your next appointment, please call your pharmacy.   Thank you for choosing [facility]! Christen Bame, RN [phone]

## 2017-12-11 DIAGNOSIS — H2513 Age-related nuclear cataract, bilateral: Secondary | ICD-10-CM | POA: Diagnosis not present

## 2017-12-13 ENCOUNTER — Other Ambulatory Visit: Payer: Self-pay | Admitting: Cardiovascular Disease

## 2017-12-24 DIAGNOSIS — Z125 Encounter for screening for malignant neoplasm of prostate: Secondary | ICD-10-CM | POA: Diagnosis not present

## 2017-12-24 DIAGNOSIS — E7800 Pure hypercholesterolemia, unspecified: Secondary | ICD-10-CM | POA: Diagnosis not present

## 2017-12-24 DIAGNOSIS — R82998 Other abnormal findings in urine: Secondary | ICD-10-CM | POA: Diagnosis not present

## 2017-12-24 DIAGNOSIS — I10 Essential (primary) hypertension: Secondary | ICD-10-CM | POA: Diagnosis not present

## 2017-12-28 DIAGNOSIS — I10 Essential (primary) hypertension: Secondary | ICD-10-CM | POA: Diagnosis not present

## 2017-12-28 DIAGNOSIS — N400 Enlarged prostate without lower urinary tract symptoms: Secondary | ICD-10-CM | POA: Diagnosis not present

## 2017-12-28 DIAGNOSIS — Z0000 Encounter for general adult medical examination without abnormal findings: Secondary | ICD-10-CM | POA: Diagnosis not present

## 2017-12-28 DIAGNOSIS — R7302 Impaired glucose tolerance (oral): Secondary | ICD-10-CM | POA: Diagnosis not present

## 2017-12-28 DIAGNOSIS — Z6836 Body mass index (BMI) 36.0-36.9, adult: Secondary | ICD-10-CM | POA: Diagnosis not present

## 2017-12-28 DIAGNOSIS — M47896 Other spondylosis, lumbar region: Secondary | ICD-10-CM | POA: Diagnosis not present

## 2017-12-28 DIAGNOSIS — N183 Chronic kidney disease, stage 3 (moderate): Secondary | ICD-10-CM | POA: Diagnosis not present

## 2017-12-28 DIAGNOSIS — I25810 Atherosclerosis of coronary artery bypass graft(s) without angina pectoris: Secondary | ICD-10-CM | POA: Diagnosis not present

## 2017-12-28 DIAGNOSIS — Z1389 Encounter for screening for other disorder: Secondary | ICD-10-CM | POA: Diagnosis not present

## 2017-12-28 DIAGNOSIS — I1310 Hypertensive heart and chronic kidney disease without heart failure, with stage 1 through stage 4 chronic kidney disease, or unspecified chronic kidney disease: Secondary | ICD-10-CM | POA: Diagnosis not present

## 2017-12-28 DIAGNOSIS — I519 Heart disease, unspecified: Secondary | ICD-10-CM | POA: Diagnosis not present

## 2017-12-28 DIAGNOSIS — E7800 Pure hypercholesterolemia, unspecified: Secondary | ICD-10-CM | POA: Diagnosis not present

## 2017-12-31 ENCOUNTER — Other Ambulatory Visit: Payer: Medicare Other | Admitting: *Deleted

## 2017-12-31 DIAGNOSIS — I10 Essential (primary) hypertension: Secondary | ICD-10-CM

## 2017-12-31 DIAGNOSIS — E782 Mixed hyperlipidemia: Secondary | ICD-10-CM

## 2017-12-31 DIAGNOSIS — I2510 Atherosclerotic heart disease of native coronary artery without angina pectoris: Secondary | ICD-10-CM

## 2017-12-31 LAB — BASIC METABOLIC PANEL
BUN / CREAT RATIO: 13 (ref 10–24)
BUN: 21 mg/dL (ref 8–27)
CHLORIDE: 102 mmol/L (ref 96–106)
CO2: 23 mmol/L (ref 20–29)
Calcium: 9.7 mg/dL (ref 8.6–10.2)
Creatinine, Ser: 1.62 mg/dL — ABNORMAL HIGH (ref 0.76–1.27)
GFR calc non Af Amer: 41 mL/min/{1.73_m2} — ABNORMAL LOW (ref >59)
GFR, EST AFRICAN AMERICAN: 47 mL/min/{1.73_m2} — AB (ref >59)
Glucose: 125 mg/dL — ABNORMAL HIGH (ref 65–99)
Potassium: 4.2 mmol/L (ref 3.5–5.2)
Sodium: 140 mmol/L (ref 134–144)

## 2018-04-24 DIAGNOSIS — D126 Benign neoplasm of colon, unspecified: Secondary | ICD-10-CM | POA: Diagnosis not present

## 2018-04-24 DIAGNOSIS — Z86010 Personal history of colonic polyps: Secondary | ICD-10-CM | POA: Diagnosis not present

## 2018-04-24 DIAGNOSIS — K635 Polyp of colon: Secondary | ICD-10-CM | POA: Diagnosis not present

## 2018-04-30 DIAGNOSIS — K635 Polyp of colon: Secondary | ICD-10-CM | POA: Diagnosis not present

## 2018-04-30 DIAGNOSIS — D126 Benign neoplasm of colon, unspecified: Secondary | ICD-10-CM | POA: Diagnosis not present

## 2018-05-29 DIAGNOSIS — I10 Essential (primary) hypertension: Secondary | ICD-10-CM | POA: Diagnosis not present

## 2018-05-29 DIAGNOSIS — I1310 Hypertensive heart and chronic kidney disease without heart failure, with stage 1 through stage 4 chronic kidney disease, or unspecified chronic kidney disease: Secondary | ICD-10-CM | POA: Diagnosis not present

## 2018-05-29 DIAGNOSIS — Z6835 Body mass index (BMI) 35.0-35.9, adult: Secondary | ICD-10-CM | POA: Diagnosis not present

## 2018-05-29 DIAGNOSIS — R42 Dizziness and giddiness: Secondary | ICD-10-CM | POA: Diagnosis not present

## 2018-06-03 DIAGNOSIS — K635 Polyp of colon: Secondary | ICD-10-CM | POA: Diagnosis not present

## 2018-06-28 DIAGNOSIS — R7302 Impaired glucose tolerance (oral): Secondary | ICD-10-CM | POA: Diagnosis not present

## 2018-06-28 DIAGNOSIS — E7800 Pure hypercholesterolemia, unspecified: Secondary | ICD-10-CM | POA: Diagnosis not present

## 2018-06-28 DIAGNOSIS — I25810 Atherosclerosis of coronary artery bypass graft(s) without angina pectoris: Secondary | ICD-10-CM | POA: Diagnosis not present

## 2018-06-28 DIAGNOSIS — M47896 Other spondylosis, lumbar region: Secondary | ICD-10-CM | POA: Diagnosis not present

## 2018-06-28 DIAGNOSIS — N400 Enlarged prostate without lower urinary tract symptoms: Secondary | ICD-10-CM | POA: Diagnosis not present

## 2018-06-28 DIAGNOSIS — E668 Other obesity: Secondary | ICD-10-CM | POA: Diagnosis not present

## 2018-06-28 DIAGNOSIS — N183 Chronic kidney disease, stage 3 (moderate): Secondary | ICD-10-CM | POA: Diagnosis not present

## 2018-06-28 DIAGNOSIS — Z6835 Body mass index (BMI) 35.0-35.9, adult: Secondary | ICD-10-CM | POA: Diagnosis not present

## 2018-06-28 DIAGNOSIS — I519 Heart disease, unspecified: Secondary | ICD-10-CM | POA: Diagnosis not present

## 2018-06-28 DIAGNOSIS — D692 Other nonthrombocytopenic purpura: Secondary | ICD-10-CM | POA: Diagnosis not present

## 2018-06-28 DIAGNOSIS — I10 Essential (primary) hypertension: Secondary | ICD-10-CM | POA: Diagnosis not present

## 2018-06-28 DIAGNOSIS — I1310 Hypertensive heart and chronic kidney disease without heart failure, with stage 1 through stage 4 chronic kidney disease, or unspecified chronic kidney disease: Secondary | ICD-10-CM | POA: Diagnosis not present

## 2018-07-11 ENCOUNTER — Encounter: Payer: Self-pay | Admitting: Cardiovascular Disease

## 2018-07-23 ENCOUNTER — Other Ambulatory Visit: Payer: Self-pay | Admitting: Gastroenterology

## 2018-08-02 ENCOUNTER — Ambulatory Visit: Payer: Medicare Other | Admitting: Cardiovascular Disease

## 2018-08-02 DIAGNOSIS — R0989 Other specified symptoms and signs involving the circulatory and respiratory systems: Secondary | ICD-10-CM

## 2018-08-14 ENCOUNTER — Other Ambulatory Visit: Payer: Self-pay

## 2018-08-14 ENCOUNTER — Encounter (HOSPITAL_COMMUNITY): Payer: Self-pay | Admitting: *Deleted

## 2018-08-16 ENCOUNTER — Ambulatory Visit (HOSPITAL_COMMUNITY): Payer: Medicare Other | Admitting: Anesthesiology

## 2018-08-16 ENCOUNTER — Encounter (HOSPITAL_COMMUNITY)
Admission: RE | Disposition: A | Discharge status: Home / Self Care | Payer: Self-pay | Source: Ambulatory Visit | Attending: Gastroenterology

## 2018-08-16 ENCOUNTER — Encounter (HOSPITAL_COMMUNITY): Payer: Self-pay | Admitting: *Deleted

## 2018-08-16 ENCOUNTER — Ambulatory Visit (HOSPITAL_COMMUNITY)
Admission: RE | Admit: 2018-08-16 | Discharge: 2018-08-16 | Disposition: A | Discharge status: Home / Self Care | Payer: Medicare Other | Source: Ambulatory Visit | Attending: Gastroenterology | Admitting: Gastroenterology

## 2018-08-16 ENCOUNTER — Other Ambulatory Visit: Payer: Self-pay

## 2018-08-16 DIAGNOSIS — Z1211 Encounter for screening for malignant neoplasm of colon: Secondary | ICD-10-CM | POA: Diagnosis not present

## 2018-08-16 DIAGNOSIS — I2510 Atherosclerotic heart disease of native coronary artery without angina pectoris: Secondary | ICD-10-CM | POA: Diagnosis not present

## 2018-08-16 DIAGNOSIS — D120 Benign neoplasm of cecum: Secondary | ICD-10-CM | POA: Diagnosis not present

## 2018-08-16 DIAGNOSIS — Z951 Presence of aortocoronary bypass graft: Secondary | ICD-10-CM | POA: Diagnosis not present

## 2018-08-16 DIAGNOSIS — K5730 Diverticulosis of large intestine without perforation or abscess without bleeding: Secondary | ICD-10-CM | POA: Diagnosis not present

## 2018-08-16 DIAGNOSIS — Z8249 Family history of ischemic heart disease and other diseases of the circulatory system: Secondary | ICD-10-CM | POA: Insufficient documentation

## 2018-08-16 DIAGNOSIS — I10 Essential (primary) hypertension: Secondary | ICD-10-CM | POA: Diagnosis not present

## 2018-08-16 DIAGNOSIS — Z7982 Long term (current) use of aspirin: Secondary | ICD-10-CM | POA: Insufficient documentation

## 2018-08-16 DIAGNOSIS — D124 Benign neoplasm of descending colon: Secondary | ICD-10-CM | POA: Diagnosis not present

## 2018-08-16 DIAGNOSIS — Z86010 Personal history of colonic polyps: Secondary | ICD-10-CM | POA: Insufficient documentation

## 2018-08-16 DIAGNOSIS — Z888 Allergy status to other drugs, medicaments and biological substances status: Secondary | ICD-10-CM | POA: Diagnosis not present

## 2018-08-16 DIAGNOSIS — F17290 Nicotine dependence, other tobacco product, uncomplicated: Secondary | ICD-10-CM | POA: Diagnosis not present

## 2018-08-16 DIAGNOSIS — K635 Polyp of colon: Secondary | ICD-10-CM | POA: Diagnosis not present

## 2018-08-16 DIAGNOSIS — D123 Benign neoplasm of transverse colon: Secondary | ICD-10-CM | POA: Diagnosis not present

## 2018-08-16 DIAGNOSIS — E785 Hyperlipidemia, unspecified: Secondary | ICD-10-CM | POA: Insufficient documentation

## 2018-08-16 DIAGNOSIS — Z79899 Other long term (current) drug therapy: Secondary | ICD-10-CM | POA: Insufficient documentation

## 2018-08-16 HISTORY — PX: COLONOSCOPY WITH PROPOFOL: SHX5780

## 2018-08-16 HISTORY — PX: POLYPECTOMY: SHX5525

## 2018-08-16 SURGERY — COLONOSCOPY WITH PROPOFOL
Anesthesia: General

## 2018-08-16 MED ORDER — LIDOCAINE HCL 1 % IJ SOLN
INTRAMUSCULAR | Status: DC | PRN
  Administered 2018-08-16: 30 mg via INTRADERMAL

## 2018-08-16 MED ORDER — PROPOFOL 10 MG/ML IV BOLUS
INTRAVENOUS | Status: DC | PRN
  Administered 2018-08-16: 10 mg via INTRAVENOUS
  Administered 2018-08-16: 20 mg via INTRAVENOUS
  Administered 2018-08-16: 10 mg via INTRAVENOUS
  Administered 2018-08-16: 20 mg via INTRAVENOUS
  Administered 2018-08-16: 10 mg via INTRAVENOUS
  Administered 2018-08-16 (×3): 20 mg via INTRAVENOUS
  Administered 2018-08-16 (×3): 10 mg via INTRAVENOUS
  Administered 2018-08-16 (×2): 20 mg via INTRAVENOUS
  Administered 2018-08-16 (×2): 10 mg via INTRAVENOUS

## 2018-08-16 MED ORDER — PROPOFOL 10 MG/ML IV BOLUS
INTRAVENOUS | Status: AC
  Filled 2018-08-16: qty 40

## 2018-08-16 MED ORDER — LACTATED RINGERS IV SOLN
INTRAVENOUS | Status: DC
  Administered 2018-08-16: 1000 mL via INTRAVENOUS

## 2018-08-16 MED ORDER — SODIUM CHLORIDE 0.9 % IV SOLN: INTRAVENOUS | Status: DC

## 2018-08-16 SURGICAL SUPPLY — 21 items

## 2018-08-16 NOTE — Discharge Instructions (Signed)

## 2018-08-16 NOTE — Anesthesia Preprocedure Evaluation (Signed)
Anesthesia Evaluation  Patient identified by MRN, date of birth, ID band Patient awake    Reviewed: Allergy & Precautions, H&P , NPO status , Patient's Chart, lab work & pertinent test results, reviewed documented beta blocker date and time   Airway Mallampati: II  TM Distance: >3 FB Neck ROM: full    Dental no notable dental hx.    Pulmonary neg pulmonary ROS, Current Smoker,    Pulmonary exam normal breath sounds clear to auscultation       Cardiovascular Exercise Tolerance: Good hypertension, Pt. on medications + CAD   Rhythm:regular Rate:Normal  ECHO 13 Study Conclusions  - Left ventricle: Asymmetrical septal hypertrophy. The cavity size was normal. Systolic function was normal. The estimated ejection fraction was in the range of 55% to 65%. Wall motion was normal; there were no regional wall motion abnormalities. Features are consistent with a pseudonormal left ventricular filling pattern, with concomitant abnormal relaxation and increased filling pressure (grade 2 diastolic dysfunction).   Neuro/Psych negative neurological ROS  negative psych ROS   GI/Hepatic Neg liver ROS, hiatal hernia,   Endo/Other  negative endocrine ROS  Renal/GU negative Renal ROS  negative genitourinary   Musculoskeletal   Abdominal   Peds  Hematology negative hematology ROS (+)   Anesthesia Other Findings   Reproductive/Obstetrics negative OB ROS                             Anesthesia Physical Anesthesia Plan  ASA: II  Anesthesia Plan: General   Post-op Pain Management:    Induction: Intravenous  PONV Risk Score and Plan: 2 and Ondansetron and Treatment may vary due to age or medical condition  Airway Management Planned: Nasal Cannula  Additional Equipment:   Intra-op Plan:   Post-operative Plan:   Informed Consent: I have reviewed the patients History and Physical, chart,  labs and discussed the procedure including the risks, benefits and alternatives for the proposed anesthesia with the patient or authorized representative who has indicated his/her understanding and acceptance.   Dental Advisory Given  Plan Discussed with: CRNA  Anesthesia Plan Comments:         Anesthesia Quick Evaluation

## 2018-08-16 NOTE — Transfer of Care (Signed)
Immediate Anesthesia Transfer of Care Note  Patient: [name]  Procedure(s) Performed: COLONOSCOPY WITH PROPOFOL (N/A ) HOT HEMOSTASIS (ARGON PLASMA COAGULATION/BICAP) (N/A ) POLYPECTOMY BIOPSY  Patient Location: PACU and Endoscopy Unit  Anesthesia Type:MAC  Level of Consciousness: sedated and patient cooperative  Airway & Oxygen Therapy: Patient Spontanous Breathing and Patient connected to nasal cannula oxygen  Post-op Assessment: Report given to RN and Post -op Vital signs reviewed and stable  Post vital signs: Reviewed and stable  Last Vitals:  Vitals Value Taken Time  BP 133/69 08/16/2018  3:17 PM  Temp    Pulse 48 08/16/2018  3:18 PM  Resp 16 08/16/2018  3:18 PM  SpO2 100 % 08/16/2018  3:18 PM  Vitals shown include unvalidated device data.  Last Pain:  Vitals:   08/16/18 1303  TempSrc: Oral  PainSc: 0-No pain         Complications: No apparent anesthesia complications

## 2018-08-16 NOTE — H&P (Signed)
Subjective:   Patient is a 77 y.o. male presents with patient had colonoscopy with multiple polyps removed one in the transverse colon with dysplasia.  This was 5/19.  The procedure is to be repeated here to evaluate for further polyps and to use the APC if needed.. Procedure including risks and benefits discussed in office.  Patient Active Problem List   Diagnosis Date Noted  . Sinus bradycardia   . HTN (hypertension)   . Hiatal hernia   . Dyslipidemia   . Essential hypertension 09/26/2016  . Bronchitis 09/24/2014  . CAD (coronary artery disease) 12/28/2011  . Syncope 12/28/2011   Past Medical History:  Diagnosis Date  . Back pain   . CAD (coronary artery disease)    status post CABG in 2008  . Dyslipidemia   . Hiatal hernia   . HTN (hypertension)   . Sinus bradycardia    Hx of  . Syncope    with negative echo/Myoview Feb 2013    Past Surgical History:  Procedure Laterality Date  . BACK SURGERY  2015   lower  . CARDIOVASCULAR STRESS TEST  2013   Negative  . CORONARY ARTERY BYPASS GRAFT  2008 x 3   with a left internal mammary artery graft to his left anterior descending, right internal mammary  artery graft to his right coronary artery, and  saphenous vein graft to his obtuse marginal.   . US ECHOCARDIOGRAPHY  07-01-2010   EF 55-60%  . US ECHOCARDIOGRAPHY  2013   Normal EF; Grade 2 diastolic dysfunction    Medications Prior to Admission  Medication Sig Dispense Refill Last Dose  . aspirin EC 81 MG tablet Take 1 tablet (81 mg total) by mouth daily.   08/15/2018 at Unknown time  . atorvastatin (LIPITOR) 10 MG tablet Take 10 mg by mouth at bedtime.   2 08/15/2018 at Unknown time  . finasteride (PROSCAR) 5 MG tablet Take 5 mg by mouth at bedtime.    08/15/2018 at Unknown time  . hydrochlorothiazide (HYDRODIURIL) 25 MG tablet Take 1 tablet (25 mg total) by mouth daily. 90 tablet 3 08/15/2018 at Unknown time  . losartan (COZAAR) 50 MG tablet TAKE 1 TABLET BY MOUTH DAILY. (Patient  taking differently: Take 50 mg by mouth daily. ) 30 tablet 11 08/15/2018 at Unknown time  . potassium chloride (K-DUR) 10 MEQ tablet Take 1 tablet (10 mEq total) by mouth daily. 90 tablet 3   . Tamsulosin HCl (FLOMAX) 0.4 MG CAPS Take 0.4 mg by mouth at bedtime.    08/15/2018 at Unknown time  . albuterol (PROVENTIL HFA;VENTOLIN HFA) 108 (90 BASE) MCG/ACT inhaler Use 2 puffs every 6 hours for 3-4 days and then every 6 hours as needed (Patient not taking: Reported on 08/14/2018) 1 Inhaler 2 Not Taking at Unknown time   Allergies  Allergen Reactions  . Atorvastatin Other (See Comments)    Muscle aches   . Crestor [Rosuvastatin] Other (See Comments)    Muscle aches   . Zetia [Ezetimibe] Other (See Comments)    Muscle aches     Social History   Tobacco Use  . Smoking status: Current Every Day Smoker    Types: Pipe  . Smokeless tobacco: Never Used  Substance Use Topics  . Alcohol use: No    Family History  Problem Relation Age of Onset  . Heart attack Mother      Objective:   Patient Vitals for the past 8 hrs:  BP Temp Temp src Pulse Resp  SpO2 Height Weight  08/16/18 1303 (!) 160/82 97.6 F (36.4 C) Oral (!) 55 14 98 % 5\' 9"  (1.753 m) 108.9 kg   No intake/output data recorded. No intake/output data recorded.   See MD Preop evaluation      Assessment:   1.  History of multiple polyps  Plan:   We will proceed with colonoscopy at this time with the APC on standby

## 2018-08-16 NOTE — Anesthesia Postprocedure Evaluation (Signed)
Anesthesia Post Note  Patient: James Barnes  Procedure(s) Performed: COLONOSCOPY WITH PROPOFOL (N/A ) POLYPECTOMY BIOPSY     Patient location during evaluation: PACU Anesthesia Type: MAC Level of consciousness: awake and alert Pain management: pain level controlled Vital Signs Assessment: post-procedure vital signs reviewed and stable Respiratory status: spontaneous breathing, nonlabored ventilation, respiratory function stable and patient connected to nasal cannula oxygen Cardiovascular status: stable and blood pressure returned to baseline Postop Assessment: no apparent nausea or vomiting Anesthetic complications: no    Last Vitals:  Vitals:   08/16/18 1520 08/16/18 1530  BP: 133/70 (!) 146/73  Pulse: (!) 52 (!) 46  Resp: (!) 24 13  Temp:    SpO2: 97% 100%    Last Pain:  Vitals:   08/16/18 1530  TempSrc:   PainSc: 0-No pain                 Skylynne Schlechter P Amya Hlad

## 2018-08-16 NOTE — Op Note (Signed)
[facility] Patient Name: James Barnes Procedure Date: 08/16/2018 MRN: [id number] Attending MD: Nancy Fetter Dr., MD Date of Birth: 1941/03/24 CSN: [id number] Age: 77 Admit Type: Outpatient Procedure:                Colonoscopy Indications:              High risk colon cancer surveillance: Personal                            history of colonic polyps patient had surveillance                            colonoscopy 5/19 with approximately 15 or 16 polyps                            removed. There was a large polyp in the transverse                            colon with question of complete removal pathology                            showing villous adenoma with high-grade dysplasia Providers:                Jeneen Rinks L. Sharea Guinther Dr., MD, Cleda Daub, RN, Charolette Child, Technician, Karis Juba, CRNA Referring MD:              Medicines:                Monitored Anesthesia Care Complications:            No immediate complications. Estimated Blood Loss:     Estimated blood loss: none. Procedure:                Pre-Anesthesia Assessment:                           - Prior to the procedure, a History and Physical                            was performed, and patient medications and                            allergies were reviewed. The patient's tolerance of                            previous anesthesia was also reviewed. The risks                            and benefits of the procedure and the sedation                            options and risks were discussed with the patient.  All questions were answered, and informed consent                            was obtained. Prior Anticoagulants: The patient has                            taken no previous anticoagulant or antiplatelet                            agents. ASA Grade Assessment: II - A patient with                            mild systemic disease. After  reviewing the risks                            and benefits, the patient was deemed in                            satisfactory condition to undergo the procedure.                           After obtaining informed consent, the colonoscope                            was passed under direct vision. Throughout the                            procedure, the patient's blood pressure, pulse, and                            oxygen saturations were monitored continuously. The                            CF-HQ190L ([id number] Olympus adult colonoscope was                            introduced through the anus and advanced to the the                            cecum, identified by appendiceal orifice and                            ileocecal valve. The colonoscopy was somewhat                            difficult due to significant looping. Successful                            completion of the procedure was aided by applying                            abdominal pressure. The patient tolerated the  procedure well. The quality of the bowel                            preparation was good. The ileocecal valve,                            appendiceal orifice, and rectum were photographed. Scope In: 2:35:24 PM Scope Out: 3:10:35 PM Scope Withdrawal Time: 0 hours 22 minutes 22 seconds  Total Procedure Duration: 0 hours 35 minutes 11 seconds  Findings:      The perianal and digital rectal examinations were normal.      Two sessile polyps were found in the cecum. The polyps were 2 to 4 mm in       size. These polyps were removed with a cold snare. Resection and       retrieval were complete. The pathology specimen was placed into Bottle       Number 1.      A 3 mm polyp was found in the transverse colon. The polyp was sessile.       The polyp was removed with a hot snare. Polyp resection was incomplete,       and the resected tissue was not retrieved. This was likely residual from        the previous transverse colon polyp. After the electrocautery appeared       to be completely vaporized there was nothing to recover.      Two sessile polyps were found in the descending colon. The polyps were 3       to 4 mm in size. These polyps were removed with a cold snare. Resection       and retrieval were complete.      The retroflexed view of the distal rectum and anal verge was normal and       showed no anal or rectal abnormalities.      Multiple medium-mouthed diverticula were found in the sigmoid colon. Impression:               - Two 2 to 4 mm polyps in the cecum, removed with a                            cold snare. Resected and retrieved.                           - One 3 mm polyp in the transverse colon, removed                            with a hot snare. Incomplete resection. Resected                            tissue not retrieved.                           - Two 3 to 4 mm polyps in the descending colon,                            removed with a cold snare. Resected and retrieved.                           -  The distal rectum and anal verge are normal on                            retroflexion view.                           - Personal history of colonic polyps. Moderate Sedation:      See anesthesia note, no moderate sedation. Recommendation:           - Patient has a contact number available for                            emergencies. The signs and symptoms of potential                            delayed complications were discussed with the                            patient. Return to normal activities tomorrow.                            Written discharge instructions were provided to the                            patient.                           - Resume previous diet.                           - Continue present medications.                           - No aspirin, ibuprofen, naproxen, or other                            non-steroidal anti-inflammatory  drugs for 5 days                            after polyp removal.                           - Repeat colonoscopy in 1 year for surveillance. Procedure Code(s):        --- Professional ---                           [phone], Colonoscopy, flexible; with removal of                            tumor(s), polyp(s), or other lesion(s) by snare                            technique Diagnosis Code(s):        --- Professional ---                           D12.0,  Benign neoplasm of cecum                           D12.3, Benign neoplasm of transverse colon (hepatic                            flexure or splenic flexure)                           Z86.010, Personal history of colonic polyps                           D12.4, Benign neoplasm of descending colon CPT copyright 2017 [employer]. All rights reserved. The codes documented in this report are preliminary and upon coder review may  be revised to meet current compliance requirements. Nancy Fetter Dr., MD 08/16/2018 3:18:45 PM This report has been signed electronically. Number of Addenda: 0

## 2018-08-19 ENCOUNTER — Encounter (HOSPITAL_COMMUNITY): Payer: Self-pay | Admitting: Gastroenterology

## 2018-09-05 DIAGNOSIS — Z23 Encounter for immunization: Secondary | ICD-10-CM | POA: Diagnosis not present

## 2018-11-07 DIAGNOSIS — R361 Hematospermia: Secondary | ICD-10-CM | POA: Diagnosis not present

## 2018-11-07 DIAGNOSIS — R3915 Urgency of urination: Secondary | ICD-10-CM | POA: Diagnosis not present

## 2018-11-07 DIAGNOSIS — N401 Enlarged prostate with lower urinary tract symptoms: Secondary | ICD-10-CM | POA: Diagnosis not present

## 2018-11-07 DIAGNOSIS — N5201 Erectile dysfunction due to arterial insufficiency: Secondary | ICD-10-CM | POA: Diagnosis not present

## 2018-12-26 DIAGNOSIS — I10 Essential (primary) hypertension: Secondary | ICD-10-CM | POA: Diagnosis not present

## 2018-12-26 DIAGNOSIS — R82998 Other abnormal findings in urine: Secondary | ICD-10-CM | POA: Diagnosis not present

## 2018-12-26 DIAGNOSIS — E7800 Pure hypercholesterolemia, unspecified: Secondary | ICD-10-CM | POA: Diagnosis not present

## 2018-12-26 DIAGNOSIS — Z125 Encounter for screening for malignant neoplasm of prostate: Secondary | ICD-10-CM | POA: Diagnosis not present

## 2018-12-26 DIAGNOSIS — R7302 Impaired glucose tolerance (oral): Secondary | ICD-10-CM | POA: Diagnosis not present

## 2018-12-30 ENCOUNTER — Other Ambulatory Visit: Payer: Self-pay | Admitting: Cardiovascular Disease

## 2019-01-02 DIAGNOSIS — N183 Chronic kidney disease, stage 3 (moderate): Secondary | ICD-10-CM | POA: Diagnosis not present

## 2019-01-02 DIAGNOSIS — D692 Other nonthrombocytopenic purpura: Secondary | ICD-10-CM | POA: Diagnosis not present

## 2019-01-02 DIAGNOSIS — Z0000 Encounter for general adult medical examination without abnormal findings: Secondary | ICD-10-CM | POA: Diagnosis not present

## 2019-01-02 DIAGNOSIS — M47896 Other spondylosis, lumbar region: Secondary | ICD-10-CM | POA: Diagnosis not present

## 2019-01-02 DIAGNOSIS — Z6836 Body mass index (BMI) 36.0-36.9, adult: Secondary | ICD-10-CM | POA: Diagnosis not present

## 2019-01-02 DIAGNOSIS — N400 Enlarged prostate without lower urinary tract symptoms: Secondary | ICD-10-CM | POA: Diagnosis not present

## 2019-01-02 DIAGNOSIS — E7800 Pure hypercholesterolemia, unspecified: Secondary | ICD-10-CM | POA: Diagnosis not present

## 2019-01-02 DIAGNOSIS — I519 Heart disease, unspecified: Secondary | ICD-10-CM | POA: Diagnosis not present

## 2019-01-02 DIAGNOSIS — I25810 Atherosclerosis of coronary artery bypass graft(s) without angina pectoris: Secondary | ICD-10-CM | POA: Diagnosis not present

## 2019-01-02 DIAGNOSIS — I1310 Hypertensive heart and chronic kidney disease without heart failure, with stage 1 through stage 4 chronic kidney disease, or unspecified chronic kidney disease: Secondary | ICD-10-CM | POA: Diagnosis not present

## 2019-01-02 DIAGNOSIS — R7302 Impaired glucose tolerance (oral): Secondary | ICD-10-CM | POA: Diagnosis not present

## 2019-01-02 DIAGNOSIS — Z1331 Encounter for screening for depression: Secondary | ICD-10-CM | POA: Diagnosis not present

## 2019-01-08 DIAGNOSIS — H2513 Age-related nuclear cataract, bilateral: Secondary | ICD-10-CM | POA: Diagnosis not present

## 2019-01-13 ENCOUNTER — Encounter: Payer: Self-pay | Admitting: Cardiovascular Disease

## 2019-01-13 ENCOUNTER — Ambulatory Visit (INDEPENDENT_AMBULATORY_CARE_PROVIDER_SITE_OTHER): Payer: Medicare Other | Admitting: Cardiovascular Disease

## 2019-01-13 VITALS — BP 112/82 | HR 81 | Ht 69.0 in | Wt 242.8 lb

## 2019-01-13 DIAGNOSIS — E782 Mixed hyperlipidemia: Secondary | ICD-10-CM | POA: Diagnosis not present

## 2019-01-13 DIAGNOSIS — I2510 Atherosclerotic heart disease of native coronary artery without angina pectoris: Secondary | ICD-10-CM | POA: Diagnosis not present

## 2019-01-13 DIAGNOSIS — I10 Essential (primary) hypertension: Secondary | ICD-10-CM | POA: Diagnosis not present

## 2019-01-13 NOTE — Progress Notes (Signed)
James Barnes Date of Birth  1941/02/21          Problem list: 1. Coronary artery disease-status post CABG in 2008 2. Dyslipidemia 3. Hypertension 4. Syncope  Previous notes.   James Barnes is a 78 y.o. gentleman with the above-noted medical history.  His episodes of presyncope he has gotten out of he has stopped his Altace.  His muscle aches resolved when he stopped the Lipitor.   He has remained active. 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  Oct. 29, 2015:  James Barnes is doing well.  Still working in his wood shop.  Just finished a table and is working on a bed for his grandson.  He stopped his statin - muscle aches. He's tried Lipitor, Crestor, Zetia.  Was not able to tolerate them.    Oct. 28, 2016:   Doing well.   No CP .  No regular exercise.  Working out in his [employer]. intolerent to statins. Has tried Zetia - did not tolerate if for some reason - he cant recall Injectable cholesterol meds were too expensive.   Oct. 31, 2017:  Doing well. No CP or dyspnea. Working in his [place] smokes his pipe,  Advised cessation.   December 10, 2017:    Does not check his BP  Does not limit his salt intake  No CP or dyspnea. Still smokes a pipe.  Does not exercise.   Looking into getting a 3 wheeled bike.  Has hip issues so does not walk  - still eats some salty foods -  Has been eating country ham recently .  Also has gained some weight   January 13, 2019:   James Barnes is seen today for follow-up visit.  He has a history of hypertension and coronary artery bypass grafting.   No CP , no dyspnea. Is generally fatigued.  Pruned some trees several weeks ago .   Had some DOE walking back up the hill He is unable to exercise because of bilateral hip pain. No angina  Has had diarrhea for the past week or so .     Current Outpatient Medications on File Prior to Visit    Medication Sig Dispense Refill  . aspirin EC 81 MG tablet Take 1 tablet (81 mg total) by mouth daily.    Marland Kitchen atorvastatin (LIPITOR) 10 MG tablet Take 10 mg by mouth at bedtime.   2  . finasteride (PROSCAR) 5 MG tablet Take 5 mg by mouth at bedtime.     . hydrochlorothiazide (HYDRODIURIL) 25 MG tablet Take 1 tablet (25 mg total) by mouth daily. 90 tablet 3  . losartan (COZAAR) 50 MG tablet TAKE 1 TABLET BY MOUTH DAILY. 30 tablet 0  . potassium chloride (K-DUR) 10 MEQ tablet Take 1 tablet (10 mEq total) by mouth daily. 90 tablet 3  . Tamsulosin HCl (FLOMAX) 0.4 MG CAPS Take 0.4 mg by mouth at bedtime.      No current facility-administered medications on file prior to visit.     Allergies  Allergen Reactions  . Atorvastatin Other (See Comments)    Muscle aches   . Crestor [Rosuvastatin] Other (See Comments)    Muscle aches   . Zetia [Ezetimibe] Other (See Comments)    Muscle aches     Past Medical History:  Diagnosis Date  .  Back pain   . CAD (coronary artery disease)    status post CABG in 2008  . Dyslipidemia   . Hiatal hernia   . HTN (hypertension)   . Sinus bradycardia    Hx of  . Syncope    with negative echo/Myoview Feb 2013    Past Surgical History:  Procedure Laterality Date  . BACK SURGERY  2015   lower  . CARDIOVASCULAR STRESS TEST  2013   Negative  . COLONOSCOPY WITH PROPOFOL N/A 08/16/2018   Procedure: COLONOSCOPY WITH PROPOFOL;  Surgeon: Laurence Spates, MD;  Location: [facility];  Service: Endoscopy;  Laterality: N/A;  . CORONARY ARTERY BYPASS GRAFT  2008 x 3   with a left internal mammary artery graft to his left anterior descending, right internal mammary  artery graft to his right coronary artery, and  saphenous vein graft to his obtuse marginal.   . POLYPECTOMY  08/16/2018   Procedure: POLYPECTOMY;  Surgeon: Laurence Spates, MD;  Location: [facility];  Service: Endoscopy;;  . US ECHOCARDIOGRAPHY  07-01-2010   EF 55-60%  . US ECHOCARDIOGRAPHY  2013    Normal EF; Grade 2 diastolic dysfunction    Social History   Tobacco Use  Smoking Status Current Every Day Smoker  . Types: Pipe  Smokeless Tobacco Never Used    Social History   Substance and Sexual Activity  Alcohol Use No    Family History  Problem Relation Age of Onset  . Heart attack Mother     Reviw of Systems:  Noted in current history.  Otherwise his systems are negative.  Physical Exam: Blood pressure 112/82, pulse 81, height 5\' 9"  (1.753 m), weight 242 lb 12.8 oz (110.1 kg), SpO2 93 %.  GEN:   Elderly male,  NAD  HEENT: Normal NECK: No JVD; No carotid bruits LYMPHATICS: No lymphadenopathy CARDIAC: RRR  RESPIRATORY:  Clear to auscultation without rales, wheezing or rhonchi  ABDOMEN: Soft, non-tender, non-distended MUSCULOSKELETAL:  No edema; No deformity  SKIN: Warm and dry NEUROLOGIC:  Alert and oriented x 3   ECG: January 13, 2019: Normal sinus rhythm at 81 beats a minute.  Right bundle branch block.  No changes from previous EKG.  Assessment / Plan:   1. Coronary artery disease- .  Does not exercise Has not cut back on smoking.  Has gained weight -  Does not limit his salt intake.  Does not make much of an effort to help himself Advised him that he would do better if he took better care of himself.   2. Dyslipidemia-     Check labs today   3. Hypertension -    BP is better.   Continue current meds.   See him again in 1 year   Mertie Moores, MD  01/13/2019 4:07 PM    [address],  [address], [place]  [phone] Pager [phone] Phone: [phone]; Fax: [phone]

## 2019-01-13 NOTE — Patient Instructions (Signed)
Medication Instructions:  Your physician recommends that you continue on your current medications as directed. Please refer to the Current Medication list given to you today.  If you need a refill on your cardiac medications before your next appointment, please call your pharmacy.   Lab work: TODAY: BMET, LFTS, LIPIDS If you have labs (blood work) drawn today and your tests are completely normal, you will receive your results only by: Marland Kitchen MyChart Message (if you have MyChart) OR . A paper copy in the mail If you have any lab test that is abnormal or we need to change your treatment, we will call you to review the results.  Testing/Procedures: None ordered  Follow-Up: At [facility], you and your health needs are our priority.  As part of our continuing mission to provide you with exceptional heart care, we have created designated Provider Care Teams.  These Care Teams include your primary Cardiologist (physician) and Advanced Practice Providers (APPs -  Physician Assistants and Nurse Practitioners) who all work together to provide you with the care you need, when you need it. You will need a follow up appointment in:  12 months.  Please call our office 2 months in advance to schedule this appointment.  You may see Mertie Moores, MD or one of the following Advanced Practice Providers on your designated Care Team: Richardson Dopp, PA-C [place], [place] . Daune Perch, NP  Any Other Special Instructions Will Be Listed Below (If Applicable).

## 2019-01-14 LAB — BASIC METABOLIC PANEL
BUN/Creatinine Ratio: 15 (ref 10–24)
BUN: 27 mg/dL (ref 8–27)
CALCIUM: 10.1 mg/dL (ref 8.6–10.2)
CO2: 25 mmol/L (ref 20–29)
Chloride: 105 mmol/L (ref 96–106)
Creatinine, Ser: 1.81 mg/dL — ABNORMAL HIGH (ref 0.76–1.27)
GFR calc Af Amer: 41 mL/min/{1.73_m2} — ABNORMAL LOW (ref >59)
GFR, EST NON AFRICAN AMERICAN: 36 mL/min/{1.73_m2} — AB (ref >59)
GLUCOSE: 145 mg/dL — AB (ref 65–99)
Potassium: 3.7 mmol/L (ref 3.5–5.2)
SODIUM: 144 mmol/L (ref 134–144)

## 2019-01-14 LAB — LIPID PANEL
CHOL/HDL RATIO: 4.3 ratio (ref 0.0–5.0)
Cholesterol, Total: 169 mg/dL (ref 100–199)
HDL: 39 mg/dL — AB (ref >39)
LDL CALC: 98 mg/dL (ref 0–99)
TRIGLYCERIDES: 158 mg/dL — AB (ref 0–149)
VLDL CHOLESTEROL CAL: 32 mg/dL (ref 5–40)

## 2019-01-14 LAB — HEPATIC FUNCTION PANEL
ALBUMIN: 4.2 g/dL (ref 3.7–4.7)
ALT: 20 IU/L (ref 0–44)
AST: 16 IU/L (ref 0–40)
Alkaline Phosphatase: 63 IU/L (ref 39–117)
Bilirubin, Direct: 0.07 mg/dL (ref 0.00–0.40)
TOTAL PROTEIN: 6.4 g/dL (ref 6.0–8.5)

## 2019-01-21 DIAGNOSIS — Z85828 Personal history of other malignant neoplasm of skin: Secondary | ICD-10-CM | POA: Diagnosis not present

## 2019-01-21 DIAGNOSIS — L821 Other seborrheic keratosis: Secondary | ICD-10-CM | POA: Diagnosis not present

## 2019-01-21 DIAGNOSIS — L570 Actinic keratosis: Secondary | ICD-10-CM | POA: Diagnosis not present

## 2019-01-21 DIAGNOSIS — L820 Inflamed seborrheic keratosis: Secondary | ICD-10-CM | POA: Diagnosis not present

## 2019-01-30 ENCOUNTER — Other Ambulatory Visit: Payer: Self-pay | Admitting: Cardiovascular Disease

## 2019-03-19 ENCOUNTER — Other Ambulatory Visit: Payer: Self-pay | Admitting: Cardiovascular Disease

## 2019-07-01 DIAGNOSIS — I25810 Atherosclerosis of coronary artery bypass graft(s) without angina pectoris: Secondary | ICD-10-CM | POA: Diagnosis not present

## 2019-07-01 DIAGNOSIS — N183 Chronic kidney disease, stage 3 (moderate): Secondary | ICD-10-CM | POA: Diagnosis not present

## 2019-07-01 DIAGNOSIS — N400 Enlarged prostate without lower urinary tract symptoms: Secondary | ICD-10-CM | POA: Diagnosis not present

## 2019-07-01 DIAGNOSIS — R7302 Impaired glucose tolerance (oral): Secondary | ICD-10-CM | POA: Diagnosis not present

## 2019-07-01 DIAGNOSIS — F17200 Nicotine dependence, unspecified, uncomplicated: Secondary | ICD-10-CM | POA: Diagnosis not present

## 2019-07-01 DIAGNOSIS — D692 Other nonthrombocytopenic purpura: Secondary | ICD-10-CM | POA: Diagnosis not present

## 2019-07-01 DIAGNOSIS — I519 Heart disease, unspecified: Secondary | ICD-10-CM | POA: Diagnosis not present

## 2019-07-01 DIAGNOSIS — E669 Obesity, unspecified: Secondary | ICD-10-CM | POA: Diagnosis not present

## 2019-07-01 DIAGNOSIS — I1310 Hypertensive heart and chronic kidney disease without heart failure, with stage 1 through stage 4 chronic kidney disease, or unspecified chronic kidney disease: Secondary | ICD-10-CM | POA: Diagnosis not present

## 2019-07-01 DIAGNOSIS — E7800 Pure hypercholesterolemia, unspecified: Secondary | ICD-10-CM | POA: Diagnosis not present

## 2019-07-04 DIAGNOSIS — I1310 Hypertensive heart and chronic kidney disease without heart failure, with stage 1 through stage 4 chronic kidney disease, or unspecified chronic kidney disease: Secondary | ICD-10-CM | POA: Diagnosis not present

## 2019-07-04 DIAGNOSIS — E7800 Pure hypercholesterolemia, unspecified: Secondary | ICD-10-CM | POA: Diagnosis not present

## 2019-07-04 DIAGNOSIS — R7302 Impaired glucose tolerance (oral): Secondary | ICD-10-CM | POA: Diagnosis not present

## 2019-09-03 ENCOUNTER — Other Ambulatory Visit: Payer: Self-pay | Admitting: Gastroenterology

## 2019-09-03 DIAGNOSIS — I10 Essential (primary) hypertension: Secondary | ICD-10-CM | POA: Diagnosis not present

## 2019-09-03 DIAGNOSIS — Z86010 Personal history of colonic polyps: Secondary | ICD-10-CM | POA: Diagnosis not present

## 2019-09-15 ENCOUNTER — Other Ambulatory Visit (HOSPITAL_COMMUNITY)
Admission: RE | Admit: 2019-09-15 | Discharge: 2019-09-15 | Disposition: A | Discharge status: Home / Self Care | Payer: Medicare Other | Source: Ambulatory Visit | Attending: Gastroenterology | Admitting: Gastroenterology

## 2019-09-15 DIAGNOSIS — Z86010 Personal history of colonic polyps: Secondary | ICD-10-CM | POA: Diagnosis not present

## 2019-09-15 DIAGNOSIS — K621 Rectal polyp: Secondary | ICD-10-CM | POA: Diagnosis not present

## 2019-09-15 DIAGNOSIS — Z20828 Contact with and (suspected) exposure to other viral communicable diseases: Secondary | ICD-10-CM | POA: Insufficient documentation

## 2019-09-15 DIAGNOSIS — Z01812 Encounter for preprocedural laboratory examination: Secondary | ICD-10-CM | POA: Diagnosis not present

## 2019-09-16 ENCOUNTER — Encounter (HOSPITAL_COMMUNITY): Payer: Self-pay | Admitting: *Deleted

## 2019-09-16 ENCOUNTER — Other Ambulatory Visit: Payer: Self-pay

## 2019-09-16 DIAGNOSIS — Z23 Encounter for immunization: Secondary | ICD-10-CM | POA: Diagnosis not present

## 2019-09-16 LAB — NOVEL CORONAVIRUS, NAA (HOSP ORDER, SEND-OUT TO REF LAB; TAT 18-24 HRS): SARS-CoV-2, NAA: NOT DETECTED

## 2019-09-18 ENCOUNTER — Encounter (HOSPITAL_COMMUNITY)
Admission: RE | Disposition: A | Discharge status: Home / Self Care | Payer: Self-pay | Source: Home / Self Care | Attending: Gastroenterology

## 2019-09-18 ENCOUNTER — Ambulatory Visit (HOSPITAL_COMMUNITY): Payer: Medicare Other | Admitting: Certified Registered Nurse Anesthetist

## 2019-09-18 ENCOUNTER — Other Ambulatory Visit: Payer: Self-pay

## 2019-09-18 ENCOUNTER — Encounter (HOSPITAL_COMMUNITY): Payer: Self-pay | Admitting: Gastroenterology

## 2019-09-18 ENCOUNTER — Ambulatory Visit (HOSPITAL_COMMUNITY)
Admission: RE | Admit: 2019-09-18 | Discharge: 2019-09-18 | Disposition: A | Discharge status: Home / Self Care | Payer: Medicare Other | Attending: Gastroenterology | Admitting: Gastroenterology

## 2019-09-18 DIAGNOSIS — E785 Hyperlipidemia, unspecified: Secondary | ICD-10-CM | POA: Insufficient documentation

## 2019-09-18 DIAGNOSIS — Z888 Allergy status to other drugs, medicaments and biological substances status: Secondary | ICD-10-CM | POA: Insufficient documentation

## 2019-09-18 DIAGNOSIS — Z951 Presence of aortocoronary bypass graft: Secondary | ICD-10-CM | POA: Diagnosis not present

## 2019-09-18 DIAGNOSIS — I2510 Atherosclerotic heart disease of native coronary artery without angina pectoris: Secondary | ICD-10-CM | POA: Diagnosis not present

## 2019-09-18 DIAGNOSIS — Z79899 Other long term (current) drug therapy: Secondary | ICD-10-CM | POA: Insufficient documentation

## 2019-09-18 DIAGNOSIS — D128 Benign neoplasm of rectum: Secondary | ICD-10-CM | POA: Insufficient documentation

## 2019-09-18 DIAGNOSIS — Z1211 Encounter for screening for malignant neoplasm of colon: Secondary | ICD-10-CM | POA: Insufficient documentation

## 2019-09-18 DIAGNOSIS — Z7982 Long term (current) use of aspirin: Secondary | ICD-10-CM | POA: Diagnosis not present

## 2019-09-18 DIAGNOSIS — I10 Essential (primary) hypertension: Secondary | ICD-10-CM | POA: Diagnosis not present

## 2019-09-18 DIAGNOSIS — K621 Rectal polyp: Secondary | ICD-10-CM | POA: Diagnosis not present

## 2019-09-18 DIAGNOSIS — Z86010 Personal history of colonic polyps: Secondary | ICD-10-CM | POA: Diagnosis not present

## 2019-09-18 DIAGNOSIS — K5730 Diverticulosis of large intestine without perforation or abscess without bleeding: Secondary | ICD-10-CM | POA: Diagnosis not present

## 2019-09-18 DIAGNOSIS — F17290 Nicotine dependence, other tobacco product, uncomplicated: Secondary | ICD-10-CM | POA: Diagnosis not present

## 2019-09-18 HISTORY — PX: POLYPECTOMY: SHX5525

## 2019-09-18 HISTORY — PX: COLONOSCOPY WITH PROPOFOL: SHX5780

## 2019-09-18 SURGERY — COLONOSCOPY WITH PROPOFOL
Anesthesia: General

## 2019-09-18 MED ORDER — LACTATED RINGERS IV SOLN
INTRAVENOUS | Status: DC
  Administered 2019-09-18: 10:00:00 1000 mL via INTRAVENOUS

## 2019-09-18 MED ORDER — PROPOFOL 10 MG/ML IV BOLUS
INTRAVENOUS | Status: DC | PRN
  Administered 2019-09-18: 30 mg via INTRAVENOUS

## 2019-09-18 MED ORDER — PROPOFOL 500 MG/50ML IV EMUL
INTRAVENOUS | Status: DC | PRN
  Administered 2019-09-18: 125 ug/kg/min via INTRAVENOUS

## 2019-09-18 MED ORDER — PROPOFOL 500 MG/50ML IV EMUL
INTRAVENOUS | Status: AC
  Filled 2019-09-18: qty 50

## 2019-09-18 MED ORDER — SODIUM CHLORIDE 0.9 % IV SOLN: INTRAVENOUS | Status: DC

## 2019-09-18 MED ORDER — ONDANSETRON HCL 4 MG/2ML IJ SOLN
INTRAMUSCULAR | Status: DC | PRN
  Administered 2019-09-18: 4 mg via INTRAVENOUS

## 2019-09-18 SURGICAL SUPPLY — 21 items

## 2019-09-18 NOTE — Anesthesia Preprocedure Evaluation (Addendum)
Anesthesia Evaluation  Patient identified by MRN, date of birth, ID band Patient awake    Reviewed: Allergy & Precautions, H&P , NPO status , Patient's Chart, lab work & pertinent test results, reviewed documented beta blocker date and time   Airway Mallampati: I  TM Distance: >3 FB Neck ROM: full    Dental no notable dental hx. (+) Teeth Intact   Pulmonary neg pulmonary ROS, Current Smoker and Patient abstained from smoking.,    Pulmonary exam normal breath sounds clear to auscultation       Cardiovascular Exercise Tolerance: Good hypertension, Pt. on medications + CAD  Normal cardiovascular exam Rhythm:regular Rate:Normal  ECHO 13 Study Conclusions  - Left ventricle: Asymmetrical septal hypertrophy. The cavity size was normal. Systolic function was normal. The estimated ejection fraction was in the range of 55% to 65%. Wall motion was normal; there were no regional wall motion abnormalities. Features are consistent with a pseudonormal left ventricular filling pattern, with concomitant abnormal relaxation and increased filling pressure (grade 2 diastolic dysfunction).   Neuro/Psych negative neurological ROS  negative psych ROS   GI/Hepatic Neg liver ROS, hiatal hernia,   Endo/Other  negative endocrine ROS  Renal/GU negative Renal ROS  negative genitourinary   Musculoskeletal   Abdominal (+) + obese,   Peds  Hematology negative hematology ROS (+)   Anesthesia Other Findings   Reproductive/Obstetrics negative OB ROS                             Anesthesia Physical  Anesthesia Plan  ASA: II  Anesthesia Plan: MAC   Post-op Pain Management:    Induction: Intravenous  PONV Risk Score and Plan: 2 and Ondansetron and Treatment may vary due to age or medical condition  Airway Management Planned: Nasal Cannula, Simple Face Mask and Natural Airway  Additional  Equipment: None  Intra-op Plan:   Post-operative Plan:   Informed Consent: I have reviewed the patients History and Physical, chart, labs and discussed the procedure including the risks, benefits and alternatives for the proposed anesthesia with the patient or authorized representative who has indicated his/her understanding and acceptance.       Plan Discussed with: CRNA  Anesthesia Plan Comments:        Anesthesia Quick Evaluation

## 2019-09-18 NOTE — Discharge Instructions (Addendum)
YOU HAD AN ENDOSCOPIC PROCEDURE TODAY: Refer to the procedure report and other information in the discharge instructions given to you for any specific questions about what was found during the examination. If this information does not answer your questions, please call [facility] office at [phone] to clarify.   YOU SHOULD EXPECT: Some feelings of bloating in the abdomen. Passage of more gas than usual. Walking can help get rid of the air that was put into your GI tract during the procedure and reduce the bloating. If you had a lower endoscopy (such as a colonoscopy or flexible sigmoidoscopy) you may notice spotting of blood in your stool or on the toilet paper. Some abdominal soreness may be present for a day or two, also.  DIET: Your first meal following the procedure should be a light meal and then it is ok to progress to your normal diet. A half-sandwich or bowl of soup is an example of a good first meal. Heavy or fried foods are harder to digest and may make you feel nauseous or bloated. Drink plenty of fluids but you should avoid alcoholic beverages for 24 hours. If you had a esophageal dilation, please see attached instructions for diet.   ACTIVITY: Your care partner should take you home directly after the procedure. You should plan to take it easy, moving slowly for the rest of the day. You can resume normal activity the day after the procedure however YOU SHOULD NOT DRIVE, use power tools, machinery or perform tasks that involve climbing or major physical exertion for 24 hours (because of the sedation medicines used during the test).   SYMPTOMS TO REPORT IMMEDIATELY: A gastroenterologist can be reached at any hour. Please call [phone]  for any of the following symptoms:   Following lower endoscopy (colonoscopy, flexible sigmoidoscopy) Excessive amounts of blood in the stool  Significant tenderness, worsening of abdominal pains  Swelling of the abdomen that is new, acute  Fever of 100  or higher  FOLLOW UP:  If any biopsies were taken you will be contacted by phone or by letter within the next 1-3 weeks. Call [phone]  if you have not heard about the biopsies in 3 weeks.  Please also call with any specific questions about appointments or follow up tests. No aspirin, ibuprofen or other NSAID medications for 5 days. Office will send note or call when pathology results are obtained. Colonoscopy will be repeated based on the pathology results.

## 2019-09-18 NOTE — Transfer of Care (Signed)
Immediate Anesthesia Transfer of Care Note  Patient: James Barnes  Procedure(s) Performed: COLONOSCOPY WITH PROPOFOL (N/A ) POLYPECTOMY  Patient Location: Endoscopy Unit  Anesthesia Type:MAC  Level of Consciousness: drowsy  Airway & Oxygen Therapy: Patient Spontanous Breathing and Patient connected to face mask  Post-op Assessment: Report given to RN and Post -op Vital signs reviewed and stable  Post vital signs: Reviewed and stable  Last Vitals:  Vitals Value Taken Time  BP    Temp    Pulse    Resp    SpO2      Last Pain:  Vitals:   09/18/19 0905  TempSrc: Oral  PainSc: 0-No pain         Complications: No apparent anesthesia complications

## 2019-09-18 NOTE — Op Note (Signed)
[facility] Patient Name: James Barnes Procedure Date: 09/18/2019 MRN: [id number] Attending MD: Nancy Fetter Dr., MD Date of Birth: 12/31/1940 CSN: [id number] Age: 78 Admit Type: Outpatient Procedure:                Colonoscopy Indications:              High risk colon cancer surveillance: Personal                            history of colonic polyps, multiple polyps removed                            some with dysplasia Providers:                Joyice Faster. Trayven Lumadue Dr., MD, Cleda Daub, RN, Marguerita Merles, Technician, Lazaro Arms, Technician Referring MD:              Medicines:                Monitored Anesthesia Care Complications:            No immediate complications. Estimated Blood Loss:     Estimated blood loss: none. Procedure:                Pre-Anesthesia Assessment:                           - Prior to the procedure, a History and Physical                            was performed, and patient medications and                            allergies were reviewed. The patient's tolerance of                            previous anesthesia was also reviewed. The risks                            and benefits of the procedure and the sedation                            options and risks were discussed with the patient.                            All questions were answered, and informed consent                            was obtained. Prior Anticoagulants: The patient has                            taken no previous anticoagulant or antiplatelet  agents. ASA Grade Assessment: II - A patient with                            mild systemic disease. After reviewing the risks                            and benefits, the patient was deemed in                            satisfactory condition to undergo the procedure.                           - Prior to the procedure, a History and Physical   was performed, and patient medications and                            allergies were reviewed. The patient's tolerance of                            previous anesthesia was also reviewed. The risks                            and benefits of the procedure and the sedation                            options and risks were discussed with the patient.                            All questions were answered, and informed consent                            was obtained. Prior Anticoagulants: The patient has                            taken no previous anticoagulant or antiplatelet                            agents except for aspirin. ASA Grade Assessment: II                            - A patient with mild systemic disease. After                            reviewing the risks and benefits, the patient was                            deemed in satisfactory condition to undergo the                            procedure.                           After obtaining informed consent, the colonoscope  was passed under direct vision. Throughout the                            procedure, the patient's blood pressure, pulse, and                            oxygen saturations were monitored continuously. The                            [id number] Olympus colonoscope was                            introduced through the anus and advanced to the the                            cecum, identified by appendiceal orifice and                            ileocecal valve. The colonoscopy was performed                            without difficulty. The patient tolerated the                            procedure well. The quality of the bowel                            preparation was good. The ileocecal valve,                            appendiceal orifice, and rectum were photographed. Scope In: 9:57:43 AM Scope Out: 10:25:19 AM Scope Withdrawal Time: 0 hours 17 minutes 49 seconds  Total  Procedure Duration: 0 hours 27 minutes 36 seconds  Findings:      The perianal and digital rectal examinations were normal.      A 5 mm polyp was found in the rectum. The polyp was sessile. The polyp       was removed with a hot snare. Resection and retrieval were complete.      Multiple medium-mouthed diverticula were found in the sigmoid colon.      The retroflexed view of the distal rectum and anal verge was normal and       showed no anal or rectal abnormalities.      The exam was otherwise without abnormality. Impression:               - One 5 mm polyp in the rectum, removed with a hot                            snare. Resected and retrieved.                           - Diverticulosis in the sigmoid colon.                           - The distal rectum and anal verge are  normal on                            retroflexion view.                           - The examination was otherwise normal.                           - Personal history of colonic polyps. Moderate Sedation:      See anesthesia note, no moderate sedation. Recommendation:           - Patient has a contact number available for                            emergencies. The signs and symptoms of potential                            delayed complications were discussed with the                            patient. Return to normal activities tomorrow.                            Written discharge instructions were provided to the                            patient.                           - Resume previous diet.                           - Continue present medications.                           - No aspirin, ibuprofen, naproxen, or other                            non-steroidal anti-inflammatory drugs for 5 days                            after polyp removal.                           - Repeat colonoscopy for surveillance based on                            pathology results. Procedure Code(s):        --- Professional ---                            [id number], Colonoscopy, flexible; with removal of                            tumor(s), polyp(s), or other lesion(s) by snare  technique Diagnosis Code(s):        --- Professional ---                           K62.1, Rectal polyp                           Z86.010, Personal history of colonic polyps                           K57.30, Diverticulosis of large intestine without                            perforation or abscess without bleeding CPT copyright 2019 [employer] Association. All rights reserved. The codes documented in this report are preliminary and upon coder review may  be revised to meet current compliance requirements. Nancy Fetter Dr., MD 09/18/2019 10:33:49 AM This report has been signed electronically. Number of Addenda: 0

## 2019-09-18 NOTE — Anesthesia Postprocedure Evaluation (Signed)
Anesthesia Post Note  Patient: James Barnes  Procedure(s) Performed: COLONOSCOPY WITH PROPOFOL (N/A ) POLYPECTOMY     Patient location during evaluation: Phase II Anesthesia Type: MAC Level of consciousness: awake Pain management: pain level controlled Vital Signs Assessment: post-procedure vital signs reviewed and stable Respiratory status: spontaneous breathing Cardiovascular status: stable Postop Assessment: no apparent nausea or vomiting Anesthetic complications: no    Last Vitals:  Vitals:   09/18/19 1033 09/18/19 1040  BP: 110/65 133/68  Pulse: 61 62  Resp: 11 (!) 21  Temp: 36.6 C   SpO2: 100% 96%    Last Pain:  Vitals:   09/18/19 1033  TempSrc: Oral  PainSc:    Pain Goal:                   Huston Foley

## 2019-09-18 NOTE — H&P (Signed)
Subjective:   Patient is a 78 y.o. male presents with history of multiple prior colon polyps some with dysplasia.  This is done as a 1 year follow-up.. Procedure including risks and benefits discussed in office.  Patient Active Problem List   Diagnosis Date Noted  . Sinus bradycardia   . HTN (hypertension)   . Hiatal hernia   . Dyslipidemia   . Essential hypertension 09/26/2016  . Bronchitis 09/24/2014  . CAD (coronary artery disease) 12/28/2011  . Syncope 12/28/2011   Past Medical History:  Diagnosis Date  . Back pain   . CAD (coronary artery disease)    status post CABG in 2008  . Dyslipidemia   . Hiatal hernia   . HTN (hypertension)   . Sinus bradycardia    Hx of  . Syncope    with negative echo/Myoview Feb 2013    Past Surgical History:  Procedure Laterality Date  . BACK SURGERY  2015   lower  . CARDIOVASCULAR STRESS TEST  2013   Negative  . COLONOSCOPY WITH PROPOFOL N/A 08/16/2018   Procedure: COLONOSCOPY WITH PROPOFOL;  Surgeon: Laurence Spates, MD;  Location: [facility];  Service: Endoscopy;  Laterality: N/A;  . CORONARY ARTERY BYPASS GRAFT  2008 x 3   with a left internal mammary artery graft to his left anterior descending, right internal mammary  artery graft to his right coronary artery, and  saphenous vein graft to his obtuse marginal.   . POLYPECTOMY  08/16/2018   Procedure: POLYPECTOMY;  Surgeon: Laurence Spates, MD;  Location: [facility];  Service: Endoscopy;;  . US ECHOCARDIOGRAPHY  07-01-2010   EF 55-60%  . US ECHOCARDIOGRAPHY  2013   Normal EF; Grade 2 diastolic dysfunction    Medications Prior to Admission  Medication Sig Dispense Refill Last Dose  . aspirin EC 81 MG tablet Take 1 tablet (81 mg total) by mouth daily.   09/17/2019 at Unknown time  . atorvastatin (LIPITOR) 10 MG tablet Take 10 mg by mouth at bedtime.   2 09/17/2019 at Unknown time  . finasteride (PROSCAR) 5 MG tablet Take 5 mg by mouth at bedtime.    09/17/2019 at Unknown time  .  hydrochlorothiazide (HYDRODIURIL) 25 MG tablet TAKE 1 TABLET BY MOUTH DAILY. 90 tablet 3 09/17/2019 at Unknown time  . losartan (COZAAR) 50 MG tablet Take 1 tablet (50 mg total) by mouth daily. 90 tablet 3 09/17/2019 at Unknown time  . potassium chloride (K-DUR) 10 MEQ tablet TAKE 1 TABLET BY MOUTH DAILY. 90 tablet 3 09/17/2019 at Unknown time  . Tamsulosin HCl (FLOMAX) 0.4 MG CAPS Take 0.4 mg by mouth at bedtime.    09/17/2019 at Unknown time   Allergies  Allergen Reactions  . Atorvastatin Other (See Comments)    Muscle aches   . Crestor [Rosuvastatin] Other (See Comments)    Muscle aches   . Zetia [Ezetimibe] Other (See Comments)    Muscle aches     Social History   Tobacco Use  . Smoking status: Current Every Day Smoker    Types: Pipe  . Smokeless tobacco: Never Used  Substance Use Topics  . Alcohol use: No    Family History  Problem Relation Age of Onset  . Heart attack Mother      Objective:   Patient Vitals for the past 8 hrs:  BP Temp Temp src Pulse Resp SpO2 Height Weight  09/18/19 0905 (!) 160/74 98.1 F (36.7 C) Oral (!) 52 20 99 % 5\' 9"  (1.753 m)  108.9 kg   No intake/output data recorded. No intake/output data recorded.   See MD Preop evaluation      Assessment:   1.  History of multiple prior colon polyps  Plan:   We will proceed with follow-up surveillance colonoscopy and polypectomy.  The patient has had this done several times and it was discussed with him again in the office.

## 2019-09-19 ENCOUNTER — Encounter (HOSPITAL_COMMUNITY): Payer: Self-pay | Admitting: Gastroenterology

## 2019-09-19 LAB — SURGICAL PATHOLOGY

## 2019-09-23 ENCOUNTER — Other Ambulatory Visit: Payer: Self-pay | Admitting: Internal Medicine

## 2019-09-23 ENCOUNTER — Other Ambulatory Visit (HOSPITAL_COMMUNITY): Payer: Self-pay | Admitting: Internal Medicine

## 2019-09-23 DIAGNOSIS — R0789 Other chest pain: Secondary | ICD-10-CM | POA: Diagnosis not present

## 2019-09-23 DIAGNOSIS — I1310 Hypertensive heart and chronic kidney disease without heart failure, with stage 1 through stage 4 chronic kidney disease, or unspecified chronic kidney disease: Secondary | ICD-10-CM | POA: Diagnosis not present

## 2019-09-23 DIAGNOSIS — I25810 Atherosclerosis of coronary artery bypass graft(s) without angina pectoris: Secondary | ICD-10-CM | POA: Diagnosis not present

## 2019-09-23 DIAGNOSIS — R9431 Abnormal electrocardiogram [ECG] [EKG]: Secondary | ICD-10-CM | POA: Diagnosis not present

## 2019-09-23 DIAGNOSIS — F17200 Nicotine dependence, unspecified, uncomplicated: Secondary | ICD-10-CM | POA: Diagnosis not present

## 2019-09-23 DIAGNOSIS — R42 Dizziness and giddiness: Secondary | ICD-10-CM

## 2019-09-23 DIAGNOSIS — H532 Diplopia: Secondary | ICD-10-CM | POA: Diagnosis not present

## 2019-09-23 DIAGNOSIS — N1832 Chronic kidney disease, stage 3b: Secondary | ICD-10-CM | POA: Diagnosis not present

## 2019-09-23 DIAGNOSIS — R030 Elevated blood-pressure reading, without diagnosis of hypertension: Secondary | ICD-10-CM | POA: Diagnosis not present

## 2019-09-24 ENCOUNTER — Ambulatory Visit (HOSPITAL_COMMUNITY)
Admission: RE | Admit: 2019-09-24 | Discharge: 2019-09-24 | Disposition: A | Discharge status: Home / Self Care | Payer: Medicare Other | Source: Ambulatory Visit | Attending: Internal Medicine | Admitting: Internal Medicine

## 2019-09-24 ENCOUNTER — Other Ambulatory Visit: Payer: Self-pay

## 2019-09-24 DIAGNOSIS — R42 Dizziness and giddiness: Secondary | ICD-10-CM | POA: Diagnosis not present

## 2019-09-24 DIAGNOSIS — H532 Diplopia: Secondary | ICD-10-CM | POA: Diagnosis not present

## 2019-09-26 ENCOUNTER — Telehealth: Payer: Self-pay

## 2019-09-26 NOTE — Telephone Encounter (Signed)
Notes on file from [place], sent referral to scheduling

## 2019-09-26 NOTE — Telephone Encounter (Signed)
Notes on file from [place], sent referral to scheduling

## 2019-09-30 DIAGNOSIS — R350 Frequency of micturition: Secondary | ICD-10-CM | POA: Diagnosis not present

## 2019-09-30 DIAGNOSIS — R3121 Asymptomatic microscopic hematuria: Secondary | ICD-10-CM | POA: Diagnosis not present

## 2019-09-30 DIAGNOSIS — N401 Enlarged prostate with lower urinary tract symptoms: Secondary | ICD-10-CM | POA: Diagnosis not present

## 2019-09-30 NOTE — Progress Notes (Signed)
James Barnes Date of Birth  May 12, 1941          Problem list: 1. Coronary artery disease-status post CABG in 2008 2. Dyslipidemia 3. Hypertension 4. Syncope  Previous notes.   James Barnes is a 78 y.o. gentleman with the above-noted medical history.  His episodes of presyncope he has gotten out of he has stopped his Altace.  His muscle aches resolved when he stopped the Lipitor.   He has remained active. 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  Oct. 29, 2015:  James Barnes is doing well.  Still working in his wood shop.  Just finished a table and is working on a bed for his grandson.  He stopped his statin - muscle aches. He's tried Lipitor, Crestor, Zetia.  Was not able to tolerate them.    Oct. 28, 2016:   Doing well.   No CP .  No regular exercise.  Working out in his [employer]. intolerent to statins. Has tried Zetia - did not tolerate if for some reason - he cant recall Injectable cholesterol meds were too expensive.   Oct. 31, 2017:  Doing well. No CP or dyspnea. Working in his [place] smokes his pipe,  Advised cessation.   December 10, 2017:    Does not check his BP  Does not limit his salt intake  No CP or dyspnea. Still smokes a pipe.  Does not exercise.   Looking into getting a 3 wheeled bike.  Has hip issues so does not walk  - still eats some salty foods -  Has been eating country ham recently .  Also has gained some weight   January 13, 2019:   James Barnes is seen today for follow-up visit.  He has a history of hypertension and coronary artery bypass grafting.   No CP , no dyspnea. Is generally fatigued.  Pruned some trees several weeks ago .   Had some DOE walking back up the hill He is unable to exercise because of bilateral hip pain. No angina  Has had diarrhea for the past week or so .   October 01, 2019:  James Barnes is seen today for follow-up of  his coronary artery disease, hyperlipidemia.  He is had coronary artery bypass grafting.  In the past he has not really paid attention to his diet.  He eats anything that he wants.  Several weeks ago.  He had some double vision while walking out of a restaurant.  Lasted 1 -2 minutes.  Went to Dr. Osborne Casco. MRI of the brain revealed a 2 cm chronic CVA .  Has an appt with neuro .   Has had some chest pain last ,  Tightness across his chest after lifting some furniture.  Did not feel like his previous episodes of angina  Lasted for few seconsd.   ,  Worse with bending and twisting his torso.   Also had an episode of lightheadedness while driving . Lasted 30 seconds.   Current Outpatient Medications on File Prior to Visit  Medication Sig Dispense Refill  . aspirin EC 81 MG tablet Take 1 tablet (81 mg total) by mouth daily.    Marland Kitchen atorvastatin (LIPITOR) 10 MG tablet Take 10 mg by mouth at bedtime.   2  . finasteride (PROSCAR) 5 MG tablet Take 5 mg by mouth at  bedtime.     . hydrochlorothiazide (HYDRODIURIL) 25 MG tablet TAKE 1 TABLET BY MOUTH DAILY. 90 tablet 3  . losartan (COZAAR) 50 MG tablet Take 1 tablet (50 mg total) by mouth daily. 90 tablet 3  . potassium chloride (K-DUR) 10 MEQ tablet TAKE 1 TABLET BY MOUTH DAILY. 90 tablet 3  . Tamsulosin HCl (FLOMAX) 0.4 MG CAPS Take 0.4 mg by mouth at bedtime.      No current facility-administered medications on file prior to visit.     Allergies  Allergen Reactions  . Atorvastatin Other (See Comments)    Muscle aches   . Crestor [Rosuvastatin] Other (See Comments)    Muscle aches   . Zetia [Ezetimibe] Other (See Comments)    Muscle aches     Past Medical History:  Diagnosis Date  . Back pain   . Back pain   . 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 CAD (coronary artery disease)    status post CABG in 2008  . Colon polyps   . Double vision   . Dyslipidemia   . Hiatal hernia   . HTN (hypertension)   . Lightheadedness   . Obesity    . Sinus bradycardia    Hx of  . Syncope    with negative echo/Myoview Feb 2013    Past Surgical History:  Procedure Laterality Date  . BACK SURGERY  2015   lower  . CARDIOVASCULAR STRESS TEST  2013   Negative  . COLONOSCOPY WITH PROPOFOL N/A 08/16/2018   Procedure: COLONOSCOPY WITH PROPOFOL;  Surgeon: Laurence Spates, MD;  Location: [facility];  Service: Endoscopy;  Laterality: N/A;  . COLONOSCOPY WITH PROPOFOL N/A 09/18/2019   Procedure: COLONOSCOPY WITH PROPOFOL;  Surgeon: Laurence Spates, MD;  Location: [facility];  Service: Endoscopy;  Laterality: N/A;  . CORONARY ARTERY BYPASS GRAFT  2008 x 3   with a left internal mammary artery graft to his left anterior descending, right internal mammary  artery graft to his right coronary artery, and  saphenous vein graft to his obtuse marginal.   . POLYPECTOMY  08/16/2018   Procedure: POLYPECTOMY;  Surgeon: Laurence Spates, MD;  Location: [facility];  Service: Endoscopy;;  . POLYPECTOMY  09/18/2019   Procedure: POLYPECTOMY;  Surgeon: Laurence Spates, MD;  Location: [facility];  Service: Endoscopy;;  . US ECHOCARDIOGRAPHY  07-01-2010   EF 55-60%  . US ECHOCARDIOGRAPHY  2013   Normal EF; Grade 2 diastolic dysfunction    Social History   Tobacco Use  Smoking Status Current Every Day Smoker  . Types: Pipe  Smokeless Tobacco Never Used    Social History   Substance and Sexual Activity  Alcohol Use No    Family History  Problem Relation Age of Onset  . Heart attack Mother     Reviw of Systems:  Noted in current history.  Otherwise his systems are negative.   Physical Exam: Blood pressure 134/70, pulse (!) 54, height 5\' 8"  (1.727 m), weight 241 lb 9.6 oz (109.6 kg), SpO2 98 %.  GEN:  Well nourished, well developed in no acute distress HEENT: Normal NECK: No JVD; No carotid bruits LYMPHATICS: No lymphadenopathy CARDIAC: RRR , no murmurs, rubs, gallops RESPIRATORY:  Clear to auscultation without rales, wheezing or rhonchi   ABDOMEN: Soft, non-tender, non-distended MUSCULOSKELETAL:  No edema; No deformity  SKIN: Warm and dry NEUROLOGIC:  Alert and oriented x 3   ECG: EKG from Dr. Loren Racer office several weeks ago shows sinus bradycardia  with a heart rate of 46.  He has a right bundle branch block.  Assessment / Plan:   1. Coronary artery disease- .  No episodes of angina.  He did have some chest wall pain recently after moving some furniture these pains only lasted for a few seconds and were associated with twisting and turning of his torso.  2. Dyslipidemia-   lipids from Dr. Loren Racer office from August, 2020 revealed an HDL of 29.  His LDL is 101.  Total cholesterol is 154.  Triglyceride level is 118.  His atorvastatin was increased from 10 mg to 20 mg a day.  3. Hypertension -   blood pressure levels look good. Advised him to exercise on a regular basis.  I strongly advised him to stop cigarette smoking.   4.  Recent episode of double vision and lightheadedness: The patient had an MRI of the brain which revealed a chronic 2 cm stroke.  He also has had several lacunar strokes.  He is scheduled to see neurology this week.  I rechecked his blood pressure here in the office.  His blood pressure was normal.  He is on aspirin 81 mg a day and atorvastatin  5.  Hematuria: He is having some episodes of hematuria.  He is going to see a urologist later this week.  Strongly advised him to quit smoking.  Mertie Moores, MD  10/01/2019 8:48 AM    [address],  [place], [place]  [address] Pager [phone] Phone: [phone]; Fax: [phone]

## 2019-10-01 ENCOUNTER — Encounter: Payer: Self-pay | Admitting: Cardiovascular Disease

## 2019-10-01 ENCOUNTER — Other Ambulatory Visit: Payer: Self-pay

## 2019-10-01 ENCOUNTER — Ambulatory Visit (INDEPENDENT_AMBULATORY_CARE_PROVIDER_SITE_OTHER): Payer: Medicare Other | Admitting: Cardiovascular Disease

## 2019-10-01 VITALS — BP 134/70 | HR 54 | Ht 68.0 in | Wt 241.6 lb

## 2019-10-01 DIAGNOSIS — I2510 Atherosclerotic heart disease of native coronary artery without angina pectoris: Secondary | ICD-10-CM | POA: Diagnosis not present

## 2019-10-01 DIAGNOSIS — I10 Essential (primary) hypertension: Secondary | ICD-10-CM

## 2019-10-01 DIAGNOSIS — E782 Mixed hyperlipidemia: Secondary | ICD-10-CM | POA: Diagnosis not present

## 2019-10-01 LAB — BASIC METABOLIC PANEL
BUN/Creatinine Ratio: 8 — ABNORMAL LOW (ref 10–24)
BUN: 13 mg/dL (ref 8–27)
CO2: 23 mmol/L (ref 20–29)
Calcium: 10.1 mg/dL (ref 8.6–10.2)
Chloride: 104 mmol/L (ref 96–106)
Creatinine, Ser: 1.59 mg/dL — ABNORMAL HIGH (ref 0.76–1.27)
GFR calc Af Amer: 48 mL/min/{1.73_m2} — ABNORMAL LOW (ref >59)
GFR calc non Af Amer: 41 mL/min/{1.73_m2} — ABNORMAL LOW (ref >59)
Glucose: 97 mg/dL (ref 65–99)
Potassium: 4 mmol/L (ref 3.5–5.2)
Sodium: 140 mmol/L (ref 134–144)

## 2019-10-01 LAB — HEPATIC FUNCTION PANEL
ALT: 18 IU/L (ref 0–44)
AST: 18 IU/L (ref 0–40)
Albumin: 4.3 g/dL (ref 3.7–4.7)
Alkaline Phosphatase: 71 IU/L (ref 39–117)
Bilirubin Total: 0.4 mg/dL (ref 0.0–1.2)
Bilirubin, Direct: 0.08 mg/dL (ref 0.00–0.40)
Total Protein: 6.3 g/dL (ref 6.0–8.5)

## 2019-10-01 LAB — LIPID PANEL
Chol/HDL Ratio: 4.2 ratio (ref 0.0–5.0)
Cholesterol, Total: 150 mg/dL (ref 100–199)
HDL: 36 mg/dL — ABNORMAL LOW (ref >39)
LDL Chol Calc (NIH): 89 mg/dL (ref 0–99)
Triglycerides: 138 mg/dL (ref 0–149)
VLDL Cholesterol Cal: 25 mg/dL (ref 5–40)

## 2019-10-01 NOTE — Patient Instructions (Signed)
Medication Instructions:  Your physician recommends that you continue on your current medications as directed. Please refer to the Current Medication list given to you today.  *If you need a refill on your cardiac medications before your next appointment, please call your pharmacy*  Lab Work: TODAY - cholesterol, liver panel, kidney function and electrolytes (BMET) If you have labs (blood work) drawn today and your tests are completely normal, you will receive your results only by: Marland Kitchen MyChart Message (if you have MyChart) OR . A paper copy in the mail If you have any lab test that is abnormal or we need to change your treatment, we will call you to review the results.   Testing/Procedures: None Ordered   Follow-Up: At [facility], you and your health needs are our priority.  As part of our continuing mission to provide you with exceptional heart care, we have created designated Provider Care Teams.  These Care Teams include your primary Cardiologist (physician) and Advanced Practice Providers (APPs -  Physician Assistants and Nurse Practitioners) who all work together to provide you with the care you need, when you need it.  Your next appointment:   12 months  The format for your next appointment:   In Person  Provider:   You may see Mertie Moores, MD or one of the following Advanced Practice Providers on your designated Care Team:    Richardson Dopp, PA-C  [place], [place]  Daune Perch, [place]

## 2019-10-02 ENCOUNTER — Other Ambulatory Visit (HOSPITAL_COMMUNITY): Payer: Self-pay | Admitting: Internal Medicine

## 2019-10-02 DIAGNOSIS — H04123 Dry eye syndrome of bilateral lacrimal glands: Secondary | ICD-10-CM | POA: Diagnosis not present

## 2019-10-02 DIAGNOSIS — I679 Cerebrovascular disease, unspecified: Secondary | ICD-10-CM

## 2019-10-02 DIAGNOSIS — H532 Diplopia: Secondary | ICD-10-CM | POA: Diagnosis not present

## 2019-10-02 DIAGNOSIS — H35033 Hypertensive retinopathy, bilateral: Secondary | ICD-10-CM | POA: Diagnosis not present

## 2019-10-02 DIAGNOSIS — H2513 Age-related nuclear cataract, bilateral: Secondary | ICD-10-CM | POA: Diagnosis not present

## 2019-10-03 ENCOUNTER — Other Ambulatory Visit: Payer: Self-pay

## 2019-10-03 ENCOUNTER — Ambulatory Visit (HOSPITAL_COMMUNITY)
Admission: RE | Admit: 2019-10-03 | Discharge: 2019-10-03 | Disposition: A | Discharge status: Home / Self Care | Payer: Medicare Other | Source: Ambulatory Visit | Attending: Family | Admitting: Family

## 2019-10-03 ENCOUNTER — Telehealth: Payer: Self-pay | Admitting: Cardiovascular Disease

## 2019-10-03 DIAGNOSIS — I679 Cerebrovascular disease, unspecified: Secondary | ICD-10-CM | POA: Diagnosis not present

## 2019-10-03 NOTE — Telephone Encounter (Signed)
Patient calling for carotid results.  

## 2019-10-03 NOTE — Telephone Encounter (Signed)
Pt has been notified of lab results by phone with verbal understanding. Pt thanked me for the call. The patient has been notified of the result and verbalized understanding.  All questions (if any) were answered. Julaine Hua, CMA 10/03/2019 11:10 AM

## 2019-10-15 DIAGNOSIS — K8020 Calculus of gallbladder without cholecystitis without obstruction: Secondary | ICD-10-CM | POA: Diagnosis not present

## 2019-10-15 DIAGNOSIS — N281 Cyst of kidney, acquired: Secondary | ICD-10-CM | POA: Diagnosis not present

## 2019-10-15 DIAGNOSIS — K5730 Diverticulosis of large intestine without perforation or abscess without bleeding: Secondary | ICD-10-CM | POA: Diagnosis not present

## 2019-10-15 DIAGNOSIS — R3121 Asymptomatic microscopic hematuria: Secondary | ICD-10-CM | POA: Diagnosis not present

## 2019-11-05 DIAGNOSIS — R3121 Asymptomatic microscopic hematuria: Secondary | ICD-10-CM | POA: Diagnosis not present

## 2019-11-05 DIAGNOSIS — N400 Enlarged prostate without lower urinary tract symptoms: Secondary | ICD-10-CM | POA: Diagnosis not present

## 2019-11-05 DIAGNOSIS — N201 Calculus of ureter: Secondary | ICD-10-CM | POA: Diagnosis not present

## 2019-12-14 ENCOUNTER — Ambulatory Visit: Payer: Medicare Other | Attending: Internal Medicine

## 2019-12-14 DIAGNOSIS — Z23 Encounter for immunization: Secondary | ICD-10-CM | POA: Diagnosis not present

## 2019-12-14 NOTE — Progress Notes (Signed)
   Covid-19 Vaccination Clinic  Name:  James Barnes    MRN: [id number] DOB: Aug 23, 1941  12/14/2019  James Barnes was observed post Covid-19 immunization for 15 minutes without incidence. He was provided with Vaccine Information Sheet and instruction to access the V-Safe system.   James Barnes was instructed to call 911 with any severe reactions post vaccine: Marland Kitchen Difficulty breathing  . Swelling of your face and throat  . A fast heartbeat  . A bad rash all over your body  . Dizziness and weakness

## 2020-01-01 DIAGNOSIS — Z125 Encounter for screening for malignant neoplasm of prostate: Secondary | ICD-10-CM | POA: Diagnosis not present

## 2020-01-01 DIAGNOSIS — E7800 Pure hypercholesterolemia, unspecified: Secondary | ICD-10-CM | POA: Diagnosis not present

## 2020-01-01 DIAGNOSIS — R7302 Impaired glucose tolerance (oral): Secondary | ICD-10-CM | POA: Diagnosis not present

## 2020-01-02 ENCOUNTER — Ambulatory Visit: Payer: Medicare Other | Attending: Internal Medicine

## 2020-01-02 DIAGNOSIS — Z23 Encounter for immunization: Secondary | ICD-10-CM | POA: Insufficient documentation

## 2020-01-02 NOTE — Progress Notes (Signed)
   Covid-19 Vaccination Clinic  Name:  James Barnes    MRN: [id number] DOB: 11-Nov-1941  01/02/2020  James Barnes was observed post Covid-19 immunization for 15 minutes without incidence. He was provided with Vaccine Information Sheet and instruction to access the V-Safe system.   James Barnes was instructed to call 911 with any severe reactions post vaccine: Marland Kitchen Difficulty breathing  . Swelling of your face and throat  . A fast heartbeat  . A bad rash all over your body  . Dizziness and weakness    Immunizations Administered    Name Date Dose VIS Date Route   Pfizer COVID-19 Vaccine 01/02/2020 10:16 AM 0.3 mL 11/07/2019 Intramuscular   Manufacturer: [place]   Lot: [id number]   [place]: [id number]

## 2020-01-07 DIAGNOSIS — I1310 Hypertensive heart and chronic kidney disease without heart failure, with stage 1 through stage 4 chronic kidney disease, or unspecified chronic kidney disease: Secondary | ICD-10-CM | POA: Diagnosis not present

## 2020-01-07 DIAGNOSIS — R0789 Other chest pain: Secondary | ICD-10-CM | POA: Diagnosis not present

## 2020-01-07 DIAGNOSIS — R82998 Other abnormal findings in urine: Secondary | ICD-10-CM | POA: Diagnosis not present

## 2020-01-08 DIAGNOSIS — M47896 Other spondylosis, lumbar region: Secondary | ICD-10-CM | POA: Diagnosis not present

## 2020-01-08 DIAGNOSIS — R7302 Impaired glucose tolerance (oral): Secondary | ICD-10-CM | POA: Diagnosis not present

## 2020-01-08 DIAGNOSIS — Z0000 Encounter for general adult medical examination without abnormal findings: Secondary | ICD-10-CM | POA: Diagnosis not present

## 2020-01-08 DIAGNOSIS — F17200 Nicotine dependence, unspecified, uncomplicated: Secondary | ICD-10-CM | POA: Diagnosis not present

## 2020-01-08 DIAGNOSIS — I1310 Hypertensive heart and chronic kidney disease without heart failure, with stage 1 through stage 4 chronic kidney disease, or unspecified chronic kidney disease: Secondary | ICD-10-CM | POA: Diagnosis not present

## 2020-01-08 DIAGNOSIS — N400 Enlarged prostate without lower urinary tract symptoms: Secondary | ICD-10-CM | POA: Diagnosis not present

## 2020-01-08 DIAGNOSIS — E669 Obesity, unspecified: Secondary | ICD-10-CM | POA: Diagnosis not present

## 2020-01-08 DIAGNOSIS — Z1339 Encounter for screening examination for other mental health and behavioral disorders: Secondary | ICD-10-CM | POA: Diagnosis not present

## 2020-01-08 DIAGNOSIS — N1832 Chronic kidney disease, stage 3b: Secondary | ICD-10-CM | POA: Diagnosis not present

## 2020-01-08 DIAGNOSIS — D126 Benign neoplasm of colon, unspecified: Secondary | ICD-10-CM | POA: Diagnosis not present

## 2020-01-08 DIAGNOSIS — I679 Cerebrovascular disease, unspecified: Secondary | ICD-10-CM | POA: Diagnosis not present

## 2020-01-08 DIAGNOSIS — I25810 Atherosclerosis of coronary artery bypass graft(s) without angina pectoris: Secondary | ICD-10-CM | POA: Diagnosis not present

## 2020-01-08 DIAGNOSIS — Z1331 Encounter for screening for depression: Secondary | ICD-10-CM | POA: Diagnosis not present

## 2020-01-08 DIAGNOSIS — E7800 Pure hypercholesterolemia, unspecified: Secondary | ICD-10-CM | POA: Diagnosis not present

## 2020-01-14 DIAGNOSIS — Z1212 Encounter for screening for malignant neoplasm of rectum: Secondary | ICD-10-CM | POA: Diagnosis not present

## 2020-02-23 ENCOUNTER — Other Ambulatory Visit: Payer: Self-pay | Admitting: Cardiovascular Disease

## 2020-04-13 ENCOUNTER — Other Ambulatory Visit: Payer: Self-pay | Admitting: Cardiovascular Disease

## 2020-08-04 DIAGNOSIS — L821 Other seborrheic keratosis: Secondary | ICD-10-CM | POA: Diagnosis not present

## 2020-08-04 DIAGNOSIS — C44529 Squamous cell carcinoma of skin of other part of trunk: Secondary | ICD-10-CM | POA: Diagnosis not present

## 2020-08-04 DIAGNOSIS — Z85828 Personal history of other malignant neoplasm of skin: Secondary | ICD-10-CM | POA: Diagnosis not present

## 2020-08-31 DIAGNOSIS — Z23 Encounter for immunization: Secondary | ICD-10-CM | POA: Diagnosis not present

## 2020-09-08 DIAGNOSIS — Z23 Encounter for immunization: Secondary | ICD-10-CM | POA: Diagnosis not present

## 2020-09-30 ENCOUNTER — Encounter: Payer: Self-pay | Admitting: Cardiovascular Disease

## 2020-09-30 NOTE — Progress Notes (Signed)
James Barnes Date of Birth  02-Oct-1941          Problem list: 1. Coronary artery disease-status post CABG in 2008 2. Dyslipidemia 3. Hypertension 4. Syncope  Previous notes.   James Barnes is a 79 y.o. gentleman with the above-noted medical history.  His episodes of presyncope he has gotten out of he has stopped his Altace.  His muscle aches resolved when he stopped the Lipitor.   He has remained active. 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  Oct. 29, 2015:  James Barnes is doing well.  Still working in his wood shop.  Just finished a table and is working on a bed for his grandson.  He stopped his statin - muscle aches. He's tried Lipitor, Crestor, Zetia.  Was not able to tolerate them.    Oct. 28, 2016:   Doing well.   No CP .  No regular exercise.  Working out in his [employer]. intolerent to statins. Has tried Zetia - did not tolerate if for some reason - he cant recall Injectable cholesterol meds were too expensive.   Oct. 31, 2017:  Doing well. No CP or dyspnea. Working in his [place] smokes his pipe,  Advised cessation.   December 10, 2017:    Does not check his BP  Does not limit his salt intake  No CP or dyspnea. Still smokes a pipe.  Does not exercise.   Looking into getting a 3 wheeled bike.  Has hip issues so does not walk  - still eats some salty foods -  Has been eating country ham recently .  Also has gained some weight   January 13, 2019:   James Barnes is seen today for follow-up visit.  He has a history of hypertension and coronary artery bypass grafting.   No CP , no dyspnea. Is generally fatigued.  Pruned some trees several weeks ago .   Had some DOE walking back up the hill He is unable to exercise because of bilateral hip pain. No angina  Has had diarrhea for the past week or so .   October 01, 2019:  James Barnes is seen today for follow-up of  his coronary artery disease, hyperlipidemia.  He is had coronary artery bypass grafting.  In the past he has not really paid attention to his diet.  He eats anything that he wants.  Several weeks ago.  He had some double vision while walking out of a restaurant.  Lasted 1 -2 minutes.  Went to Dr. Osborne Casco. MRI of the brain revealed a 2 cm chronic CVA .  Has an appt with neuro .   Has had some chest pain last ,  Tightness across his chest after lifting some furniture.  Did not feel like his previous episodes of angina  Lasted for few seconsd.   ,  Worse with bending and twisting his torso.   Also had an episode of lightheadedness while driving . Lasted 30 seconds.   Nov. 5, 2021:  James Barnes is seen today for follow up of his CAD, BP is low this am.   Has had some dizziness recently - especialy with standing  Smokes a pipe Advised to stop smoking   Current Outpatient Medications on File Prior to Visit  Medication Sig Dispense Refill  . aspirin EC 81 MG tablet Take  1 tablet (81 mg total) by mouth daily.    Marland Kitchen atorvastatin (LIPITOR) 20 MG tablet Take 20 mg by mouth daily.    . finasteride (PROSCAR) 5 MG tablet Take 5 mg by mouth at bedtime.     . hydrochlorothiazide (HYDRODIURIL) 25 MG tablet TAKE 1 TABLET BY MOUTH DAILY. 90 tablet 1  . losartan (COZAAR) 50 MG tablet TAKE 1 TABLET BY MOUTH DAILY. 90 tablet 2  . potassium chloride (KLOR-CON) 10 MEQ tablet TAKE 1 TABLET BY MOUTH DAILY. 90 tablet 1  . tadalafil (CIALIS) 20 MG tablet Take 20 mg by mouth every other day as needed.    . Tamsulosin HCl (FLOMAX) 0.4 MG CAPS Take 0.4 mg by mouth at bedtime.      No current facility-administered medications on file prior to visit.    Allergies  Allergen Reactions  . Atorvastatin Other (See Comments)    Muscle aches   . Crestor [Rosuvastatin] Other (See Comments)    Muscle aches   . Zetia [Ezetimibe] Other (See Comments)    Muscle aches     Past Medical History:  Diagnosis Date  . Back  pain   . Back pain   . 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 CAD (coronary artery disease)    status post CABG in 2008  . Colon polyps   . Double vision   . Dyslipidemia   . Hiatal hernia   . HTN (hypertension)   . Lightheadedness   . Obesity   . Sinus bradycardia    Hx of  . Syncope    with negative echo/Myoview Feb 2013    Past Surgical History:  Procedure Laterality Date  . BACK SURGERY  2015   lower  . CARDIOVASCULAR STRESS TEST  2013   Negative  . COLONOSCOPY WITH PROPOFOL N/A 08/16/2018   Procedure: COLONOSCOPY WITH PROPOFOL;  Surgeon: Laurence Spates, MD;  [facility]: [facility];  Service: Endoscopy;  Laterality: N/A;  . COLONOSCOPY WITH PROPOFOL N/A 09/18/2019   Procedure: COLONOSCOPY WITH PROPOFOL;  Surgeon: Laurence Spates, MD;  [facility];  Service: Endoscopy;  Laterality: N/A;  . CORONARY ARTERY BYPASS GRAFT  2008 x 3   with a left internal mammary artery graft to his left anterior descending, right internal mammary  artery graft to his right coronary artery, and  saphenous vein graft to his obtuse marginal.   . POLYPECTOMY  08/16/2018   Procedure: POLYPECTOMY;  Surgeon: Laurence Spates, MD;  [facility];  Service: Endoscopy;;  . POLYPECTOMY  09/18/2019   Procedure: POLYPECTOMY;  Surgeon: Laurence Spates, MD;  [facility];  Service: Endoscopy;;  . US ECHOCARDIOGRAPHY  07-01-2010   EF 55-60%  . US ECHOCARDIOGRAPHY  2013   Normal EF; Grade 2 diastolic dysfunction    Social History   Tobacco Use  Smoking Status Current Every Day Smoker  . Types: Pipe  Smokeless Tobacco Never Used    Social History   Substance and Sexual Activity  Alcohol Use No    Family History  Problem Relation Age of Onset  . Heart attack Mother     Reviw of Systems:  Noted in current history.  Otherwise his systems are negative.  Physical Exam: Blood pressure 98/66, pulse 71, height 5\' 8"  (1.727 m), weight 242 lb 9.6 oz (110 kg), SpO2 98 %.  GEN:   Well nourished, well developed in no acute distress HEENT: Normal NECK: No JVD; No carotid bruits LYMPHATICS: No lymphadenopathy CARDIAC: RRR ,  no murmurs, rubs, gallops RESPIRATORY:  Clear to auscultation without rales, wheezing or rhonchi  ABDOMEN: Soft, non-tender, non-distended MUSCULOSKELETAL:  No edema; No deformity  SKIN: Warm and dry NEUROLOGIC:  Alert and oriented x 3  ECG: October 01, 2020: Normal sinus rhythm at 75.  Right bundle branch block.  No changes from EKG.  Assessment / Plan:   1. Coronary artery disease- . No angina  Seems to be doing well.  He still smokes a pipe.  I advised him to stop smoking completely.  2. Dyslipidemia-  On atorva 20 mg a day  His LDL is 102.  He does not tolerate higher dose of atorvastatin.  He also has not tolerated rosuvastatin or Zetia.  We will refer him to our lipid clinic for consideration of PCSK9 inhibitors.  Another alternative would be bempedoic acid.  3. Hypertension -   he has had hypertension but his blood pressure is actually on the low side.  He has had symptoms of orthostatic hypotension.  We will discontinue the HCTZ and potassium.  He will take his blood pressure periodically and will call back if his BP increase.      Mertie Moores, MD  10/01/2020 9:29 AM    [address],  [place], [place]  [address] Pager [phone] Phone: [phone]; Fax: [phone]

## 2020-10-01 ENCOUNTER — Ambulatory Visit (INDEPENDENT_AMBULATORY_CARE_PROVIDER_SITE_OTHER): Payer: Medicare Other | Admitting: Cardiovascular Disease

## 2020-10-01 ENCOUNTER — Other Ambulatory Visit: Payer: Self-pay

## 2020-10-01 ENCOUNTER — Encounter: Payer: Self-pay | Admitting: Cardiovascular Disease

## 2020-10-01 VITALS — BP 98/66 | HR 71 | Ht 68.0 in | Wt 242.6 lb

## 2020-10-01 DIAGNOSIS — I10 Essential (primary) hypertension: Secondary | ICD-10-CM

## 2020-10-01 DIAGNOSIS — E782 Mixed hyperlipidemia: Secondary | ICD-10-CM | POA: Diagnosis not present

## 2020-10-01 DIAGNOSIS — I2510 Atherosclerotic heart disease of native coronary artery without angina pectoris: Secondary | ICD-10-CM

## 2020-10-01 DIAGNOSIS — Z79899 Other long term (current) drug therapy: Secondary | ICD-10-CM

## 2020-10-01 NOTE — Patient Instructions (Addendum)
Medication Instructions:  Your physician has recommended you make the following change in your medication:  1. STOP Hydrochlorothiazide 2. STOP Potassium   *If you need a refill on your cardiac medications before your next appointment, please call your pharmacy*   Lab Work: None ordered   Testing/Procedures: None ordered   Follow-Up: At [facility], you and your health needs are our priority.  As part of our continuing mission to provide you with exceptional heart care, we have created designated Provider Care Teams.  These Care Teams include your primary Cardiologist (physician) and Advanced Practice Providers (APPs -  Physician Assistants and Nurse Practitioners) who all work together to provide you with the care you need, when you need it.  You have been referred to our [place] next appointment:   1 year(s)  The format for your next appointment:   In Person  Provider:   Mertie Moores, MD    Thank you for choosing [facility]!!

## 2020-10-07 ENCOUNTER — Ambulatory Visit (INDEPENDENT_AMBULATORY_CARE_PROVIDER_SITE_OTHER): Payer: Medicare Other | Admitting: Pharmacist

## 2020-10-07 ENCOUNTER — Other Ambulatory Visit: Payer: Self-pay

## 2020-10-07 DIAGNOSIS — M791 Myalgia, unspecified site: Secondary | ICD-10-CM

## 2020-10-07 DIAGNOSIS — I2510 Atherosclerotic heart disease of native coronary artery without angina pectoris: Secondary | ICD-10-CM | POA: Diagnosis not present

## 2020-10-07 DIAGNOSIS — T466X5A Adverse effect of antihyperlipidemic and antiarteriosclerotic drugs, initial encounter: Secondary | ICD-10-CM | POA: Diagnosis not present

## 2020-10-07 DIAGNOSIS — E785 Hyperlipidemia, unspecified: Secondary | ICD-10-CM

## 2020-10-07 MED ORDER — EVOLOCUMAB 140 MG/ML ~~LOC~~ SOAJ: 1.0000 mL | SUBCUTANEOUS | 0 refills | Status: DC

## 2020-10-07 NOTE — Patient Instructions (Signed)
It was nice meeting you today!  We would like your LDL (bad cholesterol) to be less than 70  Continue your atorvastatin 20mg  daily  We will start you a new medication that you will inject into your abdomen every 14 days  We will then recheck your cholesterol in January to make sure it is working  Continue your exercise regimen and call with any questions!  Karren Cobble, PharmD, BCACP, [place] [address] [place], [place] [address] Phone: [phone]; Fax: [phone] 10/07/2020 10:32 AM

## 2020-10-07 NOTE — Progress Notes (Signed)
Patient ID: James Barnes                 DOB: [birth year]                    MRN: [id number]     HPI: James Barnes is a 79 y.o. male patient referred to lipid clinic by D. Nahser. PMH is significant for HTN, CAD (CABG 2018), and dyslipidemia. Last A1c 6.1%.  Last lipid panel was collected in 12/2019. Had myalgia with rosuvastatin 20mg .  Of note, patients last Scr 1.9 on 01/01/20.  Patient last seen by Dr. Acie Fredrickson on 10/01/20.  Continues to smoke pipe.  Due to intolerances of high intensity statins, was referred to lipid clinic.  Patient presents today in good spirits.  Exercises nightly doing jumping jacks but does not follow a low saturated fat diet.  Reports he is a "meat and potatoes" guy.  Does not recall the doses of cholesterol medications that caused muscle pains but is willing to try a new medication.    Current Medications: atorvastatin 20mg  Intolerances: Zetia 10mg , rosuvastatin 20mg  Risk Factors: CAD, HTN, pipe smoking LDL goal: <70  Diet: meat and potatoes, drinks coke and sweet tea  Exercise: does 20 jumping jacks, 20 knee bends.  Does this every night  Family History: Does not know if parents had elevated cholesterol.  Mother had MI  Social History: smoking pipe  Labs: TC 159, HDL 39, LDL 102, Trigs 89 (01/01/20 on atorvastatin 20)  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    CAD (coronary artery disease)    status post CABG in 2008   Colon polyps    Double vision    Dyslipidemia    Hiatal hernia    HTN (hypertension)    Lightheadedness    Obesity    Sinus bradycardia    Hx of   Syncope    with negative echo/Myoview Feb 2013    Current Outpatient Medications on File Prior to Visit  Medication Sig Dispense Refill   aspirin EC 81 MG tablet Take 1 tablet (81 mg total) by mouth daily.     atorvastatin (LIPITOR) 20 MG tablet Take 20 mg by mouth daily.     finasteride (PROSCAR) 5 MG tablet  Take 5 mg by mouth at bedtime.      losartan (COZAAR) 50 MG tablet TAKE 1 TABLET BY MOUTH DAILY. 90 tablet 2   tadalafil (CIALIS) 20 MG tablet Take 20 mg by mouth every other day as needed.     Tamsulosin HCl (FLOMAX) 0.4 MG CAPS Take 0.4 mg by mouth at bedtime.      No current facility-administered medications on file prior to visit.    Allergies  Allergen Reactions   Atorvastatin Other (See Comments)    Muscle aches    Crestor [Rosuvastatin] Other (See Comments)    Muscle aches    Zetia [Ezetimibe] Other (See Comments)    Muscle aches     Assessment/Plan:  1. Hyperlipidemia - Patient's LDL 102 which is above goal of <70 despite atorvastatin 20mg .  Patient intolerant to higher doses.  Advised that next steps would be Nexletol or PSCK9i.  Using [employer] demo pen, educated patient on storage, site selection and administration.  Patient believes he can do it at home but did not want to practice in room.  Will send Repatha rx to  pharmacy and complete PA if required.  Repeat lipid profile scheduled for 12/16/20.    Karren Cobble, PharmD, BCACP, [place] [address] [place], [place] [address] Phone: [phone]; Fax: [phone] 10/07/2020 10:51 AM

## 2020-10-12 ENCOUNTER — Telehealth: Payer: Self-pay | Admitting: Pharmacist

## 2020-10-12 NOTE — Telephone Encounter (Signed)
PA for Repatha approved through 10/07/21.  Key: [id number].    Called patient to let him know and he reported his copay was over 500 dollars.  Let patient know I would forward this message to see if patient can qualify for patient assistance.

## 2020-10-12 NOTE — Telephone Encounter (Signed)
Called to speak with pt about the issue but only was able to lmom him. The issue is that the pt hasn't met his deductible yet and that there is $365 of the deductible that he has to meet. Will await callback from the pt to determine what he wants to do  °

## 2020-10-29 ENCOUNTER — Other Ambulatory Visit: Payer: Self-pay

## 2020-10-29 ENCOUNTER — Ambulatory Visit (INDEPENDENT_AMBULATORY_CARE_PROVIDER_SITE_OTHER): Payer: Medicare Other | Admitting: *Deleted

## 2020-10-29 VITALS — BP 154/80 | Ht 70.0 in | Wt 242.0 lb

## 2020-10-29 DIAGNOSIS — I10 Essential (primary) hypertension: Secondary | ICD-10-CM

## 2020-10-29 MED ORDER — HYDROCHLOROTHIAZIDE 25 MG PO TABS
25.0000 mg | ORAL_TABLET | Freq: Every day | ORAL | 3 refills | Status: DC
Start: 2020-10-29 — End: 2021-09-20

## 2020-10-29 MED ORDER — POTASSIUM CHLORIDE ER 10 MEQ PO TBCR: 10.0000 meq | EXTENDED_RELEASE_TABLET | Freq: Every day | ORAL | 3 refills | Status: DC

## 2020-10-29 NOTE — Patient Instructions (Addendum)
Medication Instructions:  Restart HCTZ 25 mg daily. Restart Potassium 10 meq daily  HTN clinic will contact you to schedule appointment in about ~3 weeks.   *If you need a refill on your cardiac medications before your next appointment, please call your pharmacy*  Lab Work: None ordered.  If you have labs (blood work) drawn today and your tests are completely normal, you will receive your results only by: Marland Kitchen MyChart Message (if you have MyChart) OR . A paper copy in the mail If you have any lab test that is abnormal or we need to change your treatment, we will call you to review the results.  Testing/Procedures: None ordered.  Follow-Up: At [facility], you and your health needs are our priority.  As part of our continuing mission to provide you with exceptional heart care, we have created designated Provider Care Teams.  These Care Teams include your primary Cardiologist (physician) and Advanced Practice Providers (APPs -  Physician Assistants and Nurse Practitioners) who all work together to provide you with the care you need, when you need it.  We recommend signing up for the patient portal called "MyChart".  Sign up information is provided on this After Visit Summary.  MyChart is used to connect with patients for Virtual Visits (Telemedicine).  Patients are able to view lab/test results, encounter notes, upcoming appointments, etc.  Non-urgent messages can be sent to your provider as well.   To learn more about what you can do with MyChart, go to [email or web address].     Other Instructions:

## 2020-10-29 NOTE — Progress Notes (Signed)
1.) Reason for visit: Blood pressure check/walk in with elevated Blood pressure.   2.) Name of MD requesting visit: Dr. Acie Fredrickson   3.) H&P: Patient saw Dr. Acie Fredrickson on 11/5 with low BP at 98/66 and his HCTZ and K were discontinued. Walked in to office with c/o high BP. Patient had no trends of BP's and could only tell RN 200's/80's. Took information to Dr. Acie Fredrickson in clinic. Asked RN to bring patient back a get current BP.  4.) ROS related to problem: BP elevated when checked at home and it is making the patient nervous. No other S&S.    5.) Assessment and plan per MD: Verbal per Dr. Acie Fredrickson. Restart HTCZ 25 mg daily & potassium 10 meq daily. Made referral to HTN clinic to see in about ~3 weeks.

## 2020-11-01 ENCOUNTER — Telehealth: Payer: Self-pay | Admitting: Cardiovascular Disease

## 2020-11-01 NOTE — Telephone Encounter (Signed)
James Barnes is calling stating he received a call from the office to schedule an appointment with the pharmacy, but I was unable to find documentation in regards to this. Please advise.

## 2020-11-01 NOTE — Telephone Encounter (Signed)
See phone note from 10/29/20 for BP check and referral to HTN Clinic. Not sure if the HTN Clinic may have tried to reach the pt to make appt. Will send call to HTN Clinic.

## 2020-11-01 NOTE — Telephone Encounter (Signed)
Patient scheduled for 12/15.

## 2020-11-01 NOTE — Telephone Encounter (Signed)
HTN clinic referral was entered, scheduling team likely reached out to pt to schedule appt, they typically do not document phone note when scheduling pt.  Tried to call pt to schedule appt as well, no answer and message was left.

## 2020-11-04 ENCOUNTER — Ambulatory Visit (INDEPENDENT_AMBULATORY_CARE_PROVIDER_SITE_OTHER): Payer: Medicare Other | Admitting: Orthopedic Surgery

## 2020-11-04 ENCOUNTER — Ambulatory Visit (INDEPENDENT_AMBULATORY_CARE_PROVIDER_SITE_OTHER): Payer: Medicare Other

## 2020-11-04 ENCOUNTER — Encounter: Payer: Self-pay | Admitting: Orthopedic Surgery

## 2020-11-04 ENCOUNTER — Other Ambulatory Visit: Payer: Self-pay

## 2020-11-04 DIAGNOSIS — M25552 Pain in left hip: Secondary | ICD-10-CM | POA: Diagnosis not present

## 2020-11-04 DIAGNOSIS — I2510 Atherosclerotic heart disease of native coronary artery without angina pectoris: Secondary | ICD-10-CM | POA: Diagnosis not present

## 2020-11-04 NOTE — Progress Notes (Signed)
Office Visit Note   Patient: James Barnes           Date of Birth: July 06, 1941           MRN: [id number] Visit Date: 11/04/2020 Requested by: Haywood Pao, MD [address] [place],  [place] [address] PCP: Osborne Casco Fransico Him, MD  Subjective: Chief Complaint  Patient presents with  . Left Hip - Pain    HPI: James Barnes is a 79 year old patient with long history of left point trochanteric pain. Denies any history of injury. Patient states the pain comes and goes. He does have a history of back surgery. Reports pinpoint tenderness in the left trochanteric region. He can walk about 250 yards and then burning pain focally begins and it stops when he stops walking. It was a constant pain 2 to 3 weeks ago but now that pain has subsided. Denies any low back pain. Denies any numbness and tingling in the legs. Does not take any medication for the problem other than occasional anti-inflammatories. His pain is very predictable with ambulation. He sleeps on his stomach and cannot really report on whether or not sleeping on the side hurts the left trochanteric region. Does not have any stairs at home. Denies any weakness. Denies any groin pain. He had this pain before his back surgeries.              ROS: All systems reviewed are negative as they relate to the chief complaint within the history of present illness.  Patient denies  fevers or chills.   Assessment & Plan: Visit Diagnoses:  1. Pain in left hip     Plan: Impression is atypical left hip pain a very long duration with failure of conservative management. He has tried over-the-counter medications before without success. He has tried an exercise regimen which is appropriate but with continuation of symptoms. I think first on the differential diagnosis would be trochanteric bursitis versus abductor tendinitis versus meralgia paresthetica. Structurally there is not much arthritis in the hip on radiographs or on exam. I think it is also possible  this could be radiating pain from his back. That would need to be worked up if the hip work-up is negative. For the hip we need MRI of the pelvis to evaluate the left hip trochanteric region for structural problems in the muscles and tendons in the hip which could be contributing to his symptoms.  Follow-Up Instructions: Return for after MRI.   Orders:  Orders Placed This Encounter  Procedures  . XR HIP UNILAT W OR W/O PELVIS 2-3 VIEWS LEFT  . MR Pelvis w/o contrast   No orders of the defined types were placed in this encounter.     Procedures: No procedures performed   Clinical Data: No additional findings.  Objective: Vital Signs: There were no vitals taken for this visit.  Physical Exam:   Constitutional: Patient appears well-developed HEENT:  Head: Normocephalic Eyes:EOM are normal Neck: Normal range of motion Cardiovascular: Normal rate Pulmonary/chest: Effort normal Neurologic: Patient is alert Skin: Skin is warm Psychiatric: Patient has normal mood and affect    Ortho Exam: Ortho exam demonstrates full active and passive range of motion of both hips. Has 5 out of 5 ankle dorsiflexion plantarflexion quad hamstring strength. When he is tender he localizes the tenderness discretely to the trochanteric region but is not particularly tender today. Well-healed surgical incision on the back. No paresthesias L1 S1 bilaterally. Knee range of motion hip range of motion full. Gait  normal. No Trendelenburg gait.  Specialty Comments:  No specialty comments available.  Imaging: XR HIP UNILAT W OR W/O PELVIS 2-3 VIEWS LEFT  Result Date: 11/04/2020 AP pelvis lateral left hip reviewed.  No hip arthritis is present.  Bony pelvis without malalignment.  No acute fracture.  Minimal enthesopathic changes in both trochanteric regions.    PMFS History: Patient Active Problem List   Diagnosis Date Noted  . Myalgia due to statin 10/07/2020  . Sinus bradycardia   . HTN  (hypertension)   . Hiatal hernia   . Dyslipidemia   . Essential hypertension 09/26/2016  . Bronchitis 09/24/2014  . CAD (coronary artery disease) 12/28/2011  . Syncope 12/28/2011   Past Medical History:  Diagnosis Date  . Back pain   . Back pain   . 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 CAD (coronary artery disease)    status post CABG in 2008  . Colon polyps   . Double vision   . Dyslipidemia   . Hiatal hernia   . HTN (hypertension)   . Lightheadedness   . Obesity   . Sinus bradycardia    Hx of  . Syncope    with negative echo/Myoview Feb 2013    Family History  Problem Relation Age of Onset  . Heart attack Mother     Past Surgical History:  Procedure Laterality Date  . BACK SURGERY  2015   lower  . CARDIOVASCULAR STRESS TEST  2013   Negative  . COLONOSCOPY WITH PROPOFOL N/A 08/16/2018   Procedure: COLONOSCOPY WITH PROPOFOL;  Surgeon: Laurence Spates, MD;  [facility];  Service: Endoscopy;  Laterality: N/A;  . COLONOSCOPY WITH PROPOFOL N/A 09/18/2019   Procedure: COLONOSCOPY WITH PROPOFOL;  Surgeon: Laurence Spates, MD;  [facility];  Service: Endoscopy;  Laterality: N/A;  . CORONARY ARTERY BYPASS GRAFT  2008 x 3   with a left internal mammary artery graft to his left anterior descending, right internal mammary  artery graft to his right coronary artery, and  saphenous vein graft to his obtuse marginal.   . POLYPECTOMY  08/16/2018   Procedure: POLYPECTOMY;  Surgeon: Laurence Spates, MD;  [facility];  Service: Endoscopy;;  . POLYPECTOMY  09/18/2019   Procedure: POLYPECTOMY;  Surgeon: Laurence Spates, MD;  [facility];  Service: Endoscopy;;  . US ECHOCARDIOGRAPHY  07-01-2010   EF 55-60%  . US ECHOCARDIOGRAPHY  2013   Normal EF; Grade 2 diastolic dysfunction   Social History   Occupational History  . Not on file  Tobacco Use  . Smoking status: Current Every Day Smoker    Types: Pipe  . Smokeless tobacco: Never Used  Vaping  Use  . Vaping Use: Never used  Substance and Sexual Activity  . Alcohol use: No  . Drug use: No  . Sexual activity: Not on file

## 2020-11-10 ENCOUNTER — Other Ambulatory Visit: Payer: Self-pay

## 2020-11-10 ENCOUNTER — Ambulatory Visit (INDEPENDENT_AMBULATORY_CARE_PROVIDER_SITE_OTHER): Payer: Medicare Other | Admitting: Pharmacist

## 2020-11-10 VITALS — BP 132/78 | HR 62

## 2020-11-10 DIAGNOSIS — I10 Essential (primary) hypertension: Secondary | ICD-10-CM | POA: Diagnosis not present

## 2020-11-10 DIAGNOSIS — I2510 Atherosclerotic heart disease of native coronary artery without angina pectoris: Secondary | ICD-10-CM

## 2020-11-10 NOTE — Patient Instructions (Addendum)
It was good seeing you again.  We would like your blood pressure to be less than 130/80 so you are very close.  Continue your losartan 50mg  and hydrochlorothiazide 25mg  once daily  Check your blood pressure at home about 2 hours after your take your medications and let me know the results  If it remains greater than 130/80 we will increase the losartan, otherwise we will keep everything the same  Please call with any questions  Karren Cobble, PharmD, BCACP, CDCES, [place] [address] [place], [place] [address] Phone: [phone]; Fax: [phone] 11/10/2020 10:46 AM

## 2020-11-10 NOTE — Progress Notes (Signed)
Patient ID: [name]                 DOB: 1941/02/10                      MRN: [id number]     HPI: James Barnes is a 79 y.o. male referred by Dr. Acie Barnes to HTN clinic. PMH is significant for HTN, HLD, and CAD.  Patient was seen by Dr James Barnes on 10/01/20 with complaints of dizziness and BP was 98/66.  HCTZ was then discontinued.  On 12/3, patient walked in and reported his BP had increased.  Nurse checked BP at 154/80.  HCTZ was restarted and patient referred to HTN clinic.  Patient presents today in good spirits.  Has not checked BP at home recently but has home cuff.  Reports no dizziness, SOB, chest pain.  Reports compliance with HCTZ 25mg  and losartan 50mg .  Does not think his BP on 11/5 was accurate.  Current HTN meds: losartan 50mg , HCTZ 25mg  Previously tried: ramipril 5mg  (2013) BP goal: <130/80  Family History: MI (mother)  Social History: Pipe smoker  Diet: Drinks coke and sweet tea.    Exercise: does 20 jumping jacks, 20 knee bends.  Does this every night  Home BP readings: N/A  Wt Readings from Last 3 Encounters:  10/29/20 242 lb (109.8 kg)  10/01/20 242 lb 9.6 oz (110 kg)  10/01/19 241 lb 9.6 oz (109.6 kg)   BP Readings from Last 3 Encounters:  10/29/20 (!) 154/80  10/01/20 98/66  10/01/19 134/70   Pulse Readings from Last 3 Encounters:  10/01/20 71  10/01/19 (!) 54  09/18/19 62    Renal function: CrCl cannot be calculated (Patient's most recent lab result is older than the maximum 21 days allowed.).  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     atorvastatin (LIPITOR) 20 MG tablet Take 20 mg by mouth daily.     Evolocumab 140 MG/ML SOAJ Inject 1 mL into the skin every 14 (fourteen) days. 24 mL 0   finasteride (PROSCAR) 5 MG tablet Take 5 mg by mouth at bedtime.      hydrochlorothiazide (HYDRODIURIL) 25 MG tablet Take 1 tablet (25 mg total) by mouth daily. 90 tablet 3   losartan (COZAAR) 50 MG tablet TAKE 1 TABLET BY MOUTH DAILY. 90 tablet 2   potassium chloride (KLOR-CON) 10 MEQ tablet Take 1 tablet (10 mEq total) by mouth daily. 90 tablet 3   tadalafil (CIALIS) 20 MG tablet Take 20 mg by mouth every other day as needed.     Tamsulosin HCl (FLOMAX) 0.4 MG CAPS Take 0.4 mg by mouth at bedtime.      No current facility-administered medications on file prior to visit.    Allergies  Allergen Reactions  Atorvastatin Other (See Comments)    Muscle aches    Crestor [Rosuvastatin] Other (See Comments)    Muscle aches    Zetia [Ezetimibe] Other (See Comments)    Muscle aches      Assessment/Plan:  1. Hypertension - Patient BP in room today 132/78 which is slightly above goal of <130/80. Do not want to increase HCTZ greater than 25mg , however, have room to increase losartan.  Concern regarding patients complaints of dizziness at last cardiologist visit along with low BP (although patient does not think reading was accurate) so hesitant to increase losartan at this time.  Will have patient monitor home BP for 1 week and contact patient for readings.  IF home BP above goal, will increase losartan.  If at goal, will continue current medication regimen.  Patient voiced understanding and is in agreement.  Continue HCTZ 25mg  daily Continue losartan 50mg  daily Contact patient in 1 week for BP readings  Karren Cobble, PharmD, BCACP, CDCES, [place] [address], [place], [place] [address] Phone: [phone]; Fax: [phone] 11/10/2020 11:34 AM

## 2020-11-22 ENCOUNTER — Telehealth: Payer: Self-pay | Admitting: Orthopedic Surgery

## 2020-11-22 NOTE — Telephone Encounter (Signed)
Called pt 1x and left vm for him to set appt with Dr. August Saucer for MRI review

## 2020-11-25 ENCOUNTER — Other Ambulatory Visit: Payer: Self-pay | Admitting: Cardiovascular Disease

## 2020-11-30 ENCOUNTER — Ambulatory Visit
Admission: RE | Admit: 2020-11-30 | Discharge: 2020-11-30 | Disposition: A | Discharge status: Home / Self Care | Payer: Medicare Other | Source: Ambulatory Visit | Attending: Orthopedic Surgery | Admitting: Orthopedic Surgery

## 2020-11-30 ENCOUNTER — Other Ambulatory Visit: Payer: Self-pay

## 2020-11-30 DIAGNOSIS — M47816 Spondylosis without myelopathy or radiculopathy, lumbar region: Secondary | ICD-10-CM | POA: Diagnosis not present

## 2020-11-30 DIAGNOSIS — M533 Sacrococcygeal disorders, not elsewhere classified: Secondary | ICD-10-CM | POA: Diagnosis not present

## 2020-11-30 DIAGNOSIS — M7602 Gluteal tendinitis, left hip: Secondary | ICD-10-CM | POA: Diagnosis not present

## 2020-11-30 DIAGNOSIS — M25552 Pain in left hip: Secondary | ICD-10-CM

## 2020-12-02 ENCOUNTER — Other Ambulatory Visit: Payer: Medicare Other

## 2020-12-08 ENCOUNTER — Ambulatory Visit (INDEPENDENT_AMBULATORY_CARE_PROVIDER_SITE_OTHER): Payer: Medicare Other | Admitting: Orthopedic Surgery

## 2020-12-08 DIAGNOSIS — M25552 Pain in left hip: Secondary | ICD-10-CM | POA: Diagnosis not present

## 2020-12-11 ENCOUNTER — Encounter: Payer: Self-pay | Admitting: Orthopedic Surgery

## 2020-12-11 NOTE — Progress Notes (Signed)
Office Visit Note   Patient: James Barnes           Date of Birth: 13-Oct-1941           MRN: [id number] Visit Date: 12/08/2020 Requested by: James Pao, MD [address] [place],  [place] [address] PCP: James Casco Fransico Him, MD  Subjective: Chief Complaint  Patient presents with  . Other    Scan review    HPI: James Barnes is a 80 year old patient with left hip pain.  Since have seen Barnes he has had an MRI scan which is reviewed.  In general it shows some gluteal tendinosis otherwise no AVN no significant arthritis and only mild lower lumbar degenerative disc disease.  Has some occasional pain in that left leg and hip region but it has improved since last clinic visit.              ROS: All systems reviewed are negative as they relate to the chief complaint within the history of present illness.  Patient denies  fevers or chills.   Assessment & Plan: Visit Diagnoses:  1. Pain in left hip     Plan: Impression is left hip pain with mild gluteal tendinosis.  No discrete abductor tendon tear no AVN no labral tear no occult arthritis.  Structurally the left hip is intact.  It has improved.  I think it is possible that some of this pain could be coming from his back.  He is not really having any back pain per se but I think referred pain from the back is still a possibility.  If it becomes more severe we can proceed with that work-up but for now the left hip is structurally intact.  Follow-up as needed.  Follow-Up Instructions: Return if symptoms worsen or fail to improve.   Orders:  No orders of the defined types were placed in this encounter.  No orders of the defined types were placed in this encounter.     Procedures: No procedures performed   Clinical Data: No additional findings.  Objective: Vital Signs: There were no vitals taken for this visit.  Physical Exam:   Constitutional: Patient appears well-developed HEENT:  Head: Normocephalic Eyes:EOM are  normal Neck: Normal range of motion Cardiovascular: Normal rate Pulmonary/chest: Effort normal Neurologic: Patient is alert Skin: Skin is warm Psychiatric: Patient has normal mood and affect    Ortho Exam: Ortho exam demonstrates full active and passive range of motion of the hip.  Not too much groin pain with internal ex rotation of the left leg today.  Has good hip flexion abduction adduction strength.  No masses lymphadenopathy or skin changes noted in that hip region.  Minimal tenderness to trochanteric palpation on the left compared to the right.  Specialty Comments:  No specialty comments available.  Imaging: No results found.   PMFS History: Patient Active Problem List   Diagnosis Date Noted  . Myalgia due to statin 10/07/2020  . Sinus bradycardia   . HTN (hypertension)   . Hiatal hernia   . Dyslipidemia   . Essential hypertension 09/26/2016  . Bronchitis 09/24/2014  . CAD (coronary artery disease) 12/28/2011  . Syncope 12/28/2011   Past Medical History:  Diagnosis Date  . Back pain   . Back pain   . 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 CAD (coronary artery disease)    status post CABG in 2008  . Colon polyps   . Double vision   . Dyslipidemia   .  Hiatal hernia   . HTN (hypertension)   . Lightheadedness   . Obesity   . Sinus bradycardia    Hx of  . Syncope    with negative echo/Myoview Feb 2013    Family History  Problem Relation Age of Onset  . Heart attack Mother     Past Surgical History:  Procedure Laterality Date  . BACK SURGERY  2015   lower  . CARDIOVASCULAR STRESS TEST  2013   Negative  . COLONOSCOPY WITH PROPOFOL N/A 08/16/2018   Procedure: COLONOSCOPY WITH PROPOFOL;  Surgeon: Laurence Spates, MD;  Location: [facility];  Service: Endoscopy;  Laterality: N/A;  . COLONOSCOPY WITH PROPOFOL N/A 09/18/2019   Procedure: COLONOSCOPY WITH PROPOFOL;  Surgeon: Laurence Spates, MD;  Location: [facility];  Service: Endoscopy;  Laterality: N/A;  .  CORONARY ARTERY BYPASS GRAFT  2008 x 3   with a left internal mammary artery graft to his left anterior descending, right internal mammary  artery graft to his right coronary artery, and  saphenous vein graft to his obtuse marginal.   . POLYPECTOMY  08/16/2018   Procedure: POLYPECTOMY;  Surgeon: Laurence Spates, MD;  Location: [facility];  Service: Endoscopy;;  . POLYPECTOMY  09/18/2019   Procedure: POLYPECTOMY;  Surgeon: Laurence Spates, MD;  Location: [facility];  Service: Endoscopy;;  . US ECHOCARDIOGRAPHY  07-01-2010   EF 55-60%  . US ECHOCARDIOGRAPHY  2013   Normal EF; Grade 2 diastolic dysfunction   Social History   Occupational History  . Not on file  Tobacco Use  . Smoking status: Current Every Day Smoker    Types: Pipe  . Smokeless tobacco: Never Used  Vaping Use  . Vaping Use: Never used  Substance and Sexual Activity  . Alcohol use: No  . Drug use: No  . Sexual activity: Not on file

## 2020-12-16 ENCOUNTER — Other Ambulatory Visit: Payer: Self-pay

## 2020-12-16 ENCOUNTER — Other Ambulatory Visit: Payer: Medicare Other | Admitting: *Deleted

## 2020-12-16 DIAGNOSIS — I2510 Atherosclerotic heart disease of native coronary artery without angina pectoris: Secondary | ICD-10-CM

## 2020-12-16 DIAGNOSIS — E785 Hyperlipidemia, unspecified: Secondary | ICD-10-CM

## 2020-12-16 LAB — LIPID PANEL
Chol/HDL Ratio: 3.3 ratio (ref 0.0–5.0)
Cholesterol, Total: 131 mg/dL (ref 100–199)
HDL: 40 mg/dL (ref >39)
LDL Chol Calc (NIH): 74 mg/dL (ref 0–99)
Triglycerides: 86 mg/dL (ref 0–149)
VLDL Cholesterol Cal: 17 mg/dL (ref 5–40)

## 2021-01-03 DIAGNOSIS — E7800 Pure hypercholesterolemia, unspecified: Secondary | ICD-10-CM | POA: Diagnosis not present

## 2021-01-03 DIAGNOSIS — R7302 Impaired glucose tolerance (oral): Secondary | ICD-10-CM | POA: Diagnosis not present

## 2021-01-03 LAB — COMPREHENSIVE METABOLIC PANEL: Calcium: 8.9 (ref 8.7–10.7)

## 2021-01-03 LAB — LIPID PANEL
Cholesterol: 119 (ref 0–200)
HDL: 31 — AB (ref 35–70)
LDL Cholesterol: 60
LDl/HDL Ratio: 1.9
Triglycerides: 140 (ref 40–160)

## 2021-01-03 LAB — BASIC METABOLIC PANEL
BUN: 28 — AB (ref 4–21)
CO2: 20 (ref 13–22)
Chloride: 110 — AB (ref 99–108)
Creatinine: 1.9 — AB (ref 0.6–1.3)
Glucose: 165
Potassium: 4.1 (ref 3.4–5.3)
Sodium: 139 (ref 137–147)

## 2021-01-03 LAB — HEMOGLOBIN A1C: Hemoglobin A1C: 6.3

## 2021-01-03 LAB — TSH: TSH: 3.11 (ref 0.41–5.90)

## 2021-01-03 LAB — HEPATIC FUNCTION PANEL
ALT: 23 (ref 10–40)
AST: 18 (ref 14–40)
Alkaline Phosphatase: 70 (ref 25–125)
Bilirubin, Total: 0.6

## 2021-01-04 LAB — CBC AND DIFFERENTIAL
HCT: 40 — AB (ref 41–53)
Hemoglobin: 13.8 (ref 13.5–17.5)
WBC: 5.7

## 2021-01-04 LAB — CBC: RBC: 4.29 (ref 3.87–5.11)

## 2021-01-27 ENCOUNTER — Ambulatory Visit (INDEPENDENT_AMBULATORY_CARE_PROVIDER_SITE_OTHER): Payer: Medicare Other | Admitting: Nurse Practitioner

## 2021-01-27 ENCOUNTER — Other Ambulatory Visit: Payer: Self-pay

## 2021-01-27 ENCOUNTER — Encounter: Payer: Self-pay | Admitting: Nurse Practitioner

## 2021-01-27 VITALS — BP 121/72 | HR 63 | Temp 99.6°F | Ht 68.0 in | Wt 244.1 lb

## 2021-01-27 DIAGNOSIS — E119 Type 2 diabetes mellitus without complications: Secondary | ICD-10-CM | POA: Diagnosis not present

## 2021-01-27 DIAGNOSIS — Z7689 Persons encountering health services in other specified circumstances: Secondary | ICD-10-CM | POA: Diagnosis not present

## 2021-01-27 DIAGNOSIS — R7309 Other abnormal glucose: Secondary | ICD-10-CM

## 2021-01-27 DIAGNOSIS — I10 Essential (primary) hypertension: Secondary | ICD-10-CM | POA: Diagnosis not present

## 2021-01-27 DIAGNOSIS — N529 Male erectile dysfunction, unspecified: Secondary | ICD-10-CM | POA: Diagnosis not present

## 2021-01-27 DIAGNOSIS — Z125 Encounter for screening for malignant neoplasm of prostate: Secondary | ICD-10-CM

## 2021-01-27 DIAGNOSIS — R350 Frequency of micturition: Secondary | ICD-10-CM | POA: Diagnosis not present

## 2021-01-27 DIAGNOSIS — E785 Hyperlipidemia, unspecified: Secondary | ICD-10-CM | POA: Diagnosis not present

## 2021-01-27 LAB — POCT URINALYSIS DIPSTICK
Bilirubin, UA: NEGATIVE
Blood, UA: NEGATIVE
Glucose, UA: NEGATIVE
Ketones, UA: NEGATIVE
Leukocytes, UA: NEGATIVE
Nitrite, UA: NEGATIVE
Protein, UA: NEGATIVE
Spec Grav, UA: 1.025 (ref 1.010–1.025)
Urobilinogen, UA: 0.2 E.U./dL
pH, UA: 5.5 (ref 5.0–8.0)

## 2021-01-27 LAB — POCT GLYCOSYLATED HEMOGLOBIN (HGB A1C): Hemoglobin A1C: 6.6 % — AB (ref 4.0–5.6)

## 2021-01-27 LAB — POCT UA - MICROALBUMIN
Albumin/Creatinine Ratio, Urine, POC: <30
Creatinine, POC: 300 mg/dL
Microalbumin Ur, POC: 30 mg/L

## 2021-01-27 MED ORDER — SILDENAFIL CITRATE 50 MG PO TABS: 50.0000 mg | ORAL_TABLET | Freq: Every day | ORAL | 0 refills | Status: DC | PRN

## 2021-01-27 MED ORDER — METFORMIN HCL 500 MG PO TABS: 250.0000 mg | ORAL_TABLET | Freq: Two times a day (BID) | ORAL | 0 refills | Status: DC

## 2021-01-27 NOTE — Patient Instructions (Signed)
Diabetes Mellitus and Nutrition, Adult When you have diabetes, or diabetes mellitus, it is very important to have healthy eating habits because your blood sugar (glucose) levels are greatly affected by what you eat and drink. Eating healthy foods in the right amounts, at about the same times every day, can help you:  Control your blood glucose.  Lower your risk of heart disease.  Improve your blood pressure.  Reach or maintain a healthy weight. What can affect my meal plan? Every person with diabetes is different, and each person has different needs for a meal plan. Your health care provider may recommend that you work with a dietitian to make a meal plan that is best for you. Your meal plan may vary depending on factors such as:  The calories you need.  The medicines you take.  Your weight.  Your blood glucose, blood pressure, and cholesterol levels.  Your activity level.  Other health conditions you have, such as heart or kidney disease. How do carbohydrates affect me? Carbohydrates, also called carbs, affect your blood glucose level more than any other type of food. Eating carbs naturally raises the amount of glucose in your blood. Carb counting is a method for keeping track of how many carbs you eat. Counting carbs is important to keep your blood glucose at a healthy level, especially if you use insulin or take certain oral diabetes medicines. It is important to know how many carbs you can safely have in each meal. This is different for every person. Your dietitian can help you calculate how many carbs you should have at each meal and for each snack. How does alcohol affect me? Alcohol can cause a sudden decrease in blood glucose (hypoglycemia), especially if you use insulin or take certain oral diabetes medicines. Hypoglycemia can be a life-threatening condition. Symptoms of hypoglycemia, such as sleepiness, dizziness, and confusion, are similar to symptoms of having too much  alcohol.  Do not drink alcohol if: ? Your health care provider tells you not to drink. ? You are pregnant, may be pregnant, or are planning to become pregnant.  If you drink alcohol: ? Do not drink on an empty stomach. ? Limit how much you use to:  0-1 drink a day for women.  0-2 drinks a day for men. ? Be aware of how much alcohol is in your drink. In the [place] one drink equals one 12 oz bottle of beer (355 mL), one 5 oz glass of wine (148 mL), or one 1 oz glass of hard liquor (44 mL). ? Keep yourself hydrated with water, diet soda, or unsweetened iced tea.  Keep in mind that regular soda, juice, and other mixers may contain a lot of sugar and must be counted as carbs. What are tips for following this plan? Reading food labels  Start by checking the serving size on the "Nutrition Facts" label of packaged foods and drinks. The amount of calories, carbs, fats, and other nutrients listed on the label is based on one serving of the item. Many items contain more than one serving per package.  Check the total grams (g) of carbs in one serving. You can calculate the number of servings of carbs in one serving by dividing the total carbs by 15. For example, if a food has 30 g of total carbs per serving, it would be equal to 2 servings of carbs.  Check the number of grams (g) of saturated fats and trans fats in one serving. Choose foods that have   a low amount or none of these fats.  Check the number of milligrams (mg) of salt (sodium) in one serving. Most people should limit total sodium intake to less than 2,300 mg per day.  Always check the nutrition information of foods labeled as "low-fat" or "nonfat." These foods may be higher in added sugar or refined carbs and should be avoided.  Talk to your dietitian to identify your daily goals for nutrients listed on the label. Shopping  Avoid buying canned, pre-made, or processed foods. These foods tend to be high in fat, sodium, and added  sugar.  Shop around the outside edge of the grocery store. This is where you will most often find fresh fruits and vegetables, bulk grains, fresh meats, and fresh dairy. Cooking  Use low-heat cooking methods, such as baking, instead of high-heat cooking methods like deep frying.  Cook using healthy oils, such as olive, canola, or sunflower oil.  Avoid cooking with butter, cream, or high-fat meats. Meal planning  Eat meals and snacks regularly, preferably at the same times every day. Avoid going long periods of time without eating.  Eat foods that are high in fiber, such as fresh fruits, vegetables, beans, and whole grains. Talk with your dietitian about how many servings of carbs you can eat at each meal.  Eat 4-6 oz (112-168 g) of lean protein each day, such as lean meat, chicken, fish, eggs, or tofu. One ounce (oz) of lean protein is equal to: ? 1 oz (28 g) of meat, chicken, or fish. ? 1 egg. ?  cup (62 g) of tofu.  Eat some foods each day that contain healthy fats, such as avocado, nuts, seeds, and fish.   What foods should I eat? Fruits Berries. Apples. Oranges. Peaches. Apricots. Plums. Grapes. Mango. Papaya. Pomegranate. Kiwi. Cherries. Vegetables Lettuce. Spinach. Leafy greens, including kale, chard, collard greens, and mustard greens. Beets. Cauliflower. Cabbage. Broccoli. Carrots. Green beans. Tomatoes. Peppers. Onions. Cucumbers. Brussels sprouts. Grains Whole grains, such as whole-wheat or whole-grain bread, crackers, tortillas, cereal, and pasta. Unsweetened oatmeal. Quinoa. Brown or wild rice. Meats and other proteins Seafood. Poultry without skin. Lean cuts of poultry and beef. Tofu. Nuts. Seeds. Dairy Low-fat or fat-free dairy products such as milk, yogurt, and cheese. The items listed above may not be a complete list of foods and beverages you can eat. Contact a dietitian for more information. What foods should I avoid? Fruits Fruits canned with  syrup. Vegetables Canned vegetables. Frozen vegetables with butter or cream sauce. Grains Refined white flour and flour products such as bread, pasta, snack foods, and cereals. Avoid all processed foods. Meats and other proteins Fatty cuts of meat. Poultry with skin. Breaded or fried meats. Processed meat. Avoid saturated fats. Dairy Full-fat yogurt, cheese, or milk. Beverages Sweetened drinks, such as soda or iced tea. The items listed above may not be a complete list of foods and beverages you should avoid. Contact a dietitian for more information. Questions to ask a health care provider  Do I need to meet with a diabetes educator?  Do I need to meet with a dietitian?  What number can I call if I have questions?  When are the best times to check my blood glucose? Where to find more information:  American Diabetes Association: diabetes.org  Academy of Nutrition and Dietetics: www.eatright.org  National Institute of Diabetes and Digestive and Kidney Diseases: www.niddk.nih.gov  Association of Diabetes Care and Education Specialists: www.diabeteseducator.org Summary  It is important to have healthy eating   habits because your blood sugar (glucose) levels are greatly affected by what you eat and drink.  A healthy meal plan will help you control your blood glucose and maintain a healthy lifestyle.  Your health care provider may recommend that you work with a dietitian to make a meal plan that is best for you.  Keep in mind that carbohydrates (carbs) and alcohol have immediate effects on your blood glucose levels. It is important to count carbs and to use alcohol carefully. This information is not intended to replace advice given to you by your health care provider. Make sure you discuss any questions you have with your health care provider. Document Revised: 10/21/2019 Document Reviewed: 10/21/2019 Elsevier Patient Education  2021 [employer]  

## 2021-01-27 NOTE — Progress Notes (Signed)
Reviewed with patient during visit

## 2021-01-27 NOTE — Progress Notes (Signed)
New Patient Office Visit  Subjective:  Patient ID: James Barnes, male    DOB: 1941-09-25  Age: 80 y.o. MRN: [id number]  CC:  Chief Complaint  Patient presents with  . Follow-up    HPI James Barnes presents for establishment of new primary care provider. He has been seeing physician at [facility]. Of late, he has been a little unhappy with the care he was receiving there. Today, he is concerned about excessive urination and erectile dysfunction. He has seen a urologist for this in the past. He is currently taking finasteride. He was prescribed cialis 20mg  as needed. He states that these medications did not help with the problem.  He did have fasting blood work done prior to this visit. His HDL was low at 31 with remainder of the lipid panel normal. His Chol/HDL ratio was 3.8 and normal. He does have history of CAD. Had open heart surgery 12 to 13 years ago. He states that he has had no problems since. He does see a cardiologist routinely.  Blood work showed his glucose at 165 with mild elevation of BUN and serum creatinine. He states that he has not been diagnosed with diabets in the past. liooking back at prior labs, his most recent HgbA1c was 6.1 in February 2021.  The patient denies chest pain, chest pressure, or shortness of breath. He denies headaches, dizziness, or visual disturbance. He denies abdominal pain, nausea, vomiting, or diarrhea.    Past Medical History:  Diagnosis Date  . Back pain   . Back pain   . 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 CAD (coronary artery disease)    status post CABG in 2008  . Colon polyps   . Double vision   . Dyslipidemia   . Hiatal hernia   . HTN (hypertension)   . Lightheadedness   . Obesity   . Sinus bradycardia    Hx of  . Syncope    with negative echo/Myoview Feb 2013    Past Surgical History:  Procedure Laterality Date  . BACK SURGERY  2015   lower  . CARDIOVASCULAR STRESS TEST  2013   Negative  . COLONOSCOPY WITH  PROPOFOL N/A 08/16/2018   Procedure: COLONOSCOPY WITH PROPOFOL;  Surgeon: Laurence Spates, MD;  Location: [facility];  Service: Endoscopy;  Laterality: N/A;  . COLONOSCOPY WITH PROPOFOL N/A 09/18/2019   Procedure: COLONOSCOPY WITH PROPOFOL;  Surgeon: Laurence Spates, MD;  Location: [facility];  Service: Endoscopy;  Laterality: N/A;  . CORONARY ARTERY BYPASS GRAFT  2008 x 3   with a left internal mammary artery graft to his left anterior descending, right internal mammary  artery graft to his right coronary artery, and  saphenous vein graft to his obtuse marginal.   . POLYPECTOMY  08/16/2018   Procedure: POLYPECTOMY;  Surgeon: Laurence Spates, MD;  Location: [facility];  Service: Endoscopy;;  . POLYPECTOMY  09/18/2019   Procedure: POLYPECTOMY;  Surgeon: Laurence Spates, MD;  Location: [facility];  Service: Endoscopy;;  . US ECHOCARDIOGRAPHY  07-01-2010   EF 55-60%  . US ECHOCARDIOGRAPHY  2013   Normal EF; Grade 2 diastolic dysfunction    Family History  Problem Relation Age of Onset  . Heart attack Mother     Social History   Socioeconomic History  . Marital status: Divorced    Spouse name: Not on file  . Number of children: Not on file  . Years of education: Not on file  .  Highest education level: Not on file  Occupational History  . Not on file  Tobacco Use  . Smoking status: Current Every Day Smoker    Types: Pipe  . Smokeless tobacco: Never Used  Vaping Use  . Vaping Use: Never used  Substance and Sexual Activity  . Alcohol use: No  . Drug use: No  . Sexual activity: Not on file  Other Topics Concern  . Not on file  Social History Narrative  . Not on file   Social Determinants of Health   Financial Resource Strain: Not on file  Food Insecurity: Not on file  Transportation Needs: Not on file  Physical Activity: Not on file  Stress: Not on file  Social Connections: Not on file  Intimate Partner Violence: Not on file    ROS Review of Systems  Endocrine:        Elevated blood glucose on labs done 01/03/2021.   Genitourinary: Positive for frequency and urgency.       Erectile dysfunction.   All other systems reviewed and are negative.   Objective:   Today's Vitals   01/27/21 0838 01/27/21 0932  BP: 140/77 121/72  Pulse: 63   Temp: 99.6 F (37.6 C)   SpO2: 96%   Weight: 244 lb 1.6 oz (110.7 kg)   Height: 5\' 8"  (1.727 m)    Body mass index is 37.12 kg/m. Physical Exam Vitals and nursing note reviewed.  Constitutional:      General: He is not in acute distress.    Appearance: Normal appearance. He is well-developed. He is not diaphoretic.  HENT:     Head: Normocephalic and atraumatic.     Nose: Nose normal.     Mouth/Throat:     Pharynx: No oropharyngeal exudate.  Eyes:     Pupils: Pupils are equal, round, and reactive to light.  Neck:     Thyroid: No thyromegaly.     Vascular: Carotid bruit present. No JVD.     Trachea: No tracheal deviation.     Comments: Soft carotid bruit on right side of the neck.  Cardiovascular:     Rate and Rhythm: Normal rate and regular rhythm.     Pulses: Normal pulses.     Heart sounds: Normal heart sounds. No murmur heard. No friction rub. No gallop.   Pulmonary:     Effort: Pulmonary effort is normal. No respiratory distress.     Breath sounds: Normal breath sounds. No wheezing or rales.  Chest:     Chest wall: No tenderness.  Abdominal:     General: Bowel sounds are normal.     Palpations: Abdomen is soft.     Tenderness: There is no abdominal tenderness.  Musculoskeletal:        General: Normal range of motion.     Cervical back: Normal range of motion and neck supple.  Lymphadenopathy:     Cervical: No cervical adenopathy.  Skin:    General: Skin is warm and dry.  Neurological:     Mental Status: He is alert and oriented to person, place, and time.     Cranial Nerves: No cranial nerve deficit.  Psychiatric:        Mood and Affect: Mood normal.        Behavior: Behavior normal.         Thought Content: Thought content normal.        Judgment: Judgment normal.     Assessment & Plan:  1. Encounter to establish care  The patient was seen today to establish new primary care provider.   2. Abnormal glucose HgbA1c  Is 6.6today. urine microalbumin is normal.  - POCT glycosylated hemoglobin (Hb A1C) - POCT UA - Microalbumin  3. Controlled type 2 diabetes mellitus without complication, without long-term current use of insulin (HCC) Start metformin 250mg  twice daily. Nutrition information for diabetics was given to the patient while in the office. He was referred for nutrition and diabetic services.  - Referral to Nutrition and Diabetes Services - metFORMIN (GLUCOPHAGE) 500 MG tablet; Take 0.5 tablets (250 mg total) by mouth 2 (two) times daily with a meal.  Dispense: 90 tablet; Refill: 0  4. Urine frequency Urine dip negative for infection, glucose, or other abnormalities. Will check PSA.  - POCT Urinalysis Dipstick - PSA  5. Erectile dysfunction, unspecified erectile dysfunction type Trial viagra 50mg  as needed.  - sildenafil (VIAGRA) 50 MG tablet; Take 1 tablet (50 mg total) by mouth daily as needed for erectile dysfunction.  Dispense: 10 tablet; Refill: 0  6. Essential hypertension Stable. Continue bp medication as prescribed.   7. Dyslipidemia Reviewed labs showing low HDL at 31. Remaining lipid panel normal. Will monitor.   8. Screening for prostate cancer Check PSA today.  - PSA   Problem List Items Addressed This Visit      Cardiovascular and Mediastinum   Essential hypertension   Relevant Medications   sildenafil (VIAGRA) 50 MG tablet     Endocrine   Controlled type 2 diabetes mellitus without complication, without long-term current use of insulin (HCC)   Relevant Medications   metFORMIN (GLUCOPHAGE) 500 MG tablet   Other Relevant Orders   Referral to Nutrition and Diabetes Services     Other   Dyslipidemia   Encounter to establish care -  Primary   Abnormal glucose   Relevant Orders   POCT glycosylated hemoglobin (Hb A1C) (Completed)   POCT UA - Microalbumin (Completed)   Urine frequency   Relevant Orders   POCT Urinalysis Dipstick (Completed)   PSA   Erectile dysfunction   Relevant Medications   sildenafil (VIAGRA) 50 MG tablet   Screening for prostate cancer   Relevant Orders   PSA      Outpatient Encounter Medications as of 01/27/2021  Medication Sig  . aspirin EC 81 MG tablet Take 1 tablet (81 mg total) by mouth daily.  Marland Kitchen atorvastatin (LIPITOR) 20 MG tablet Take 20 mg by mouth daily.  Marland Kitchen atorvastatin (LIPITOR) 40 MG tablet 40 mg.  . finasteride (PROSCAR) 5 MG tablet Take 5 mg by mouth at bedtime.   . hydrochlorothiazide (HYDRODIURIL) 25 MG tablet Take 1 tablet (25 mg total) by mouth daily.  Marland Kitchen losartan (COZAAR) 50 MG tablet TAKE 1 TABLET BY MOUTH DAILY.  . metFORMIN (GLUCOPHAGE) 500 MG tablet Take 0.5 tablets (250 mg total) by mouth 2 (two) times daily with a meal.  . potassium chloride (KLOR-CON) 10 MEQ tablet Take 1 tablet (10 mEq total) by mouth daily.  . sildenafil (VIAGRA) 50 MG tablet Take 1 tablet (50 mg total) by mouth daily as needed for erectile dysfunction.  . tadalafil (CIALIS) 20 MG tablet Take 20 mg by mouth every other day as needed.  . Tamsulosin HCl (FLOMAX) 0.4 MG CAPS Take 0.4 mg by mouth at bedtime.    No facility-administered encounter medications on file as of 01/27/2021.   Time spent with the patient was approximately 45 minutes. This time included reviewing progress notes, labs, imaging studies, and discussing plan  for follow up.    Follow-up: Return in about 3 months (around 04/29/2021) for [facility] with [place].   Ronnell Freshwater, NP

## 2021-01-28 LAB — PSA: Prostate Specific Ag, Serum: 0.3 ng/mL (ref 0.0–4.0)

## 2021-01-28 NOTE — Progress Notes (Signed)
Pt aware of lab results and verbalized understanding. AS, CMA

## 2021-01-28 NOTE — Progress Notes (Signed)
Hey. Will you let the patient know that his prostate antigen is normal. Thanks.

## 2021-02-09 ENCOUNTER — Other Ambulatory Visit: Payer: Self-pay | Admitting: Physician Assistant

## 2021-02-15 ENCOUNTER — Encounter: Payer: Self-pay | Admitting: Nurse Practitioner

## 2021-02-24 ENCOUNTER — Other Ambulatory Visit: Payer: Self-pay | Admitting: Cardiovascular Disease

## 2021-03-02 IMAGING — MR MR HEAD W/O CM
12 of 13 series · 44 of 48 positions shown · non-contrast
Comparison: No pertinent prior studies available for comparison.

CLINICAL DATA: Double vision, lightheadedness

EXAM:
MRI HEAD WITHOUT CONTRAST
TECHNIQUE: Multiplanar, multiecho pulse sequences of the brain and surrounding
structures were obtained without intravenous contrast.

[Series 5: DWI · axial · 3.0mm · 0.88mm/px · z∈[-72,+74]mm · 8 of 100 slices shown (1 of 4)]
[im 1/100]
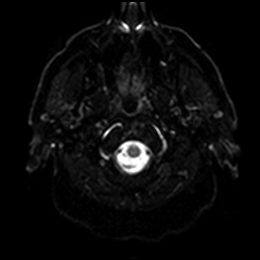
[im 15/100]
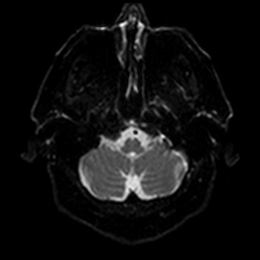
[im 29/100]
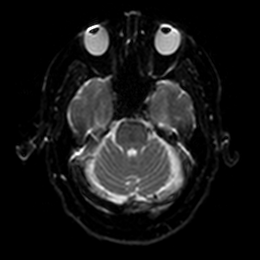
[im 43/100]
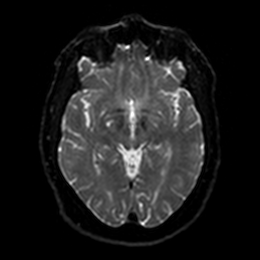
[im 57/100]
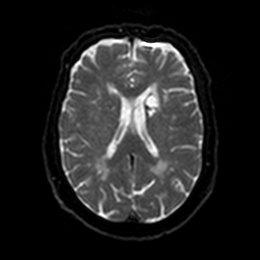
[im 71/100]
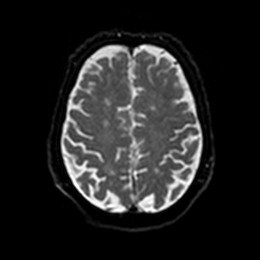
[im 85/100]
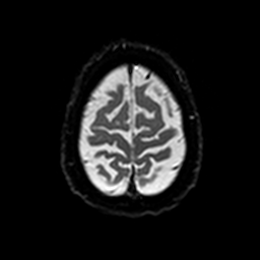
[im 100/100]
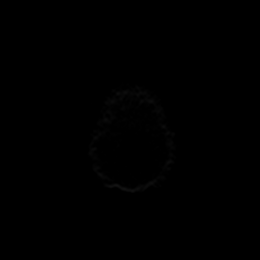

[Series 6: DWI · axial · 3.0mm · 0.88mm/px · z∈[-72,+74]mm · 4 of 50 slices shown (2 of 4)]
[im 1/50]
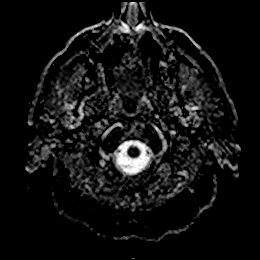
[im 17/50]
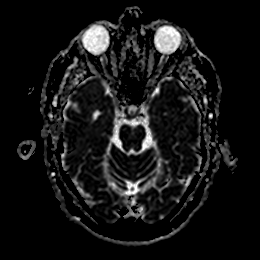
[im 33/50]
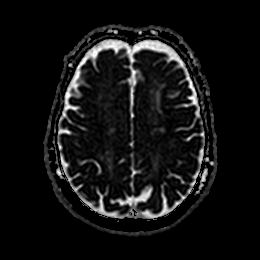
[im 50/50]
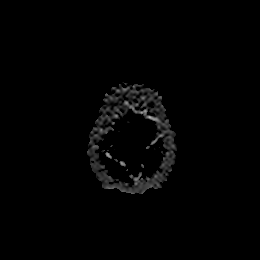

[Series 7: DWI · coronal · 4.0mm · 0.88mm/px · 5 of 70 slices shown (3 of 4)]
[im 1/70]
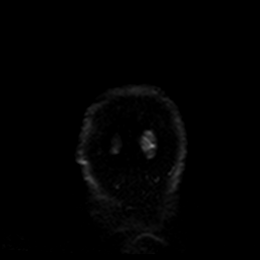
[im 18/70]
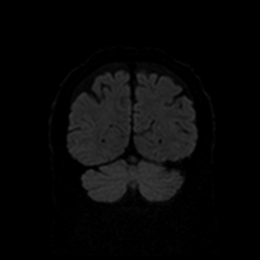
[im 35/70]
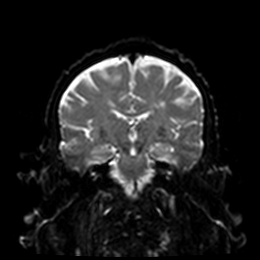
[im 52/70]
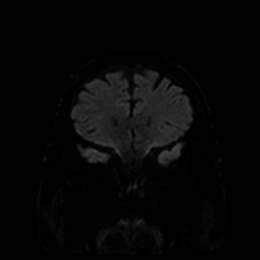
[im 70/70]
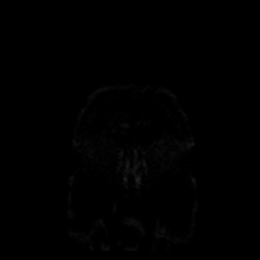

[Series 8: DWI · coronal · 4.0mm · 0.88mm/px · 3 of 35 slices shown (4 of 4)]
[im 1/35]
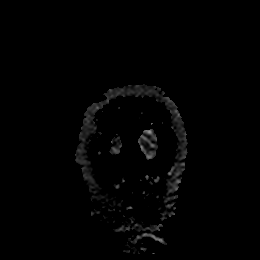
[im 18/35]
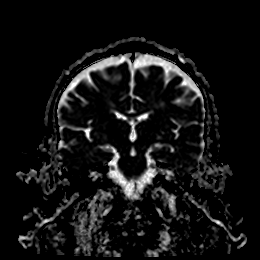
[im 35/35]
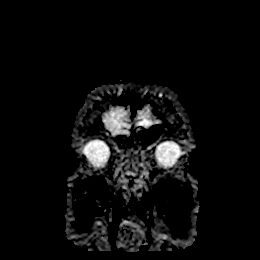

[Series 9: T1 · sagittal · 5.0mm · 0.75mm/px · 2 of 23 slices shown]
[im 1/23]
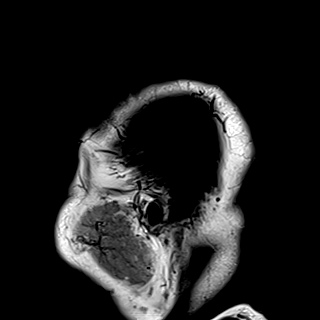
[im 23/23]
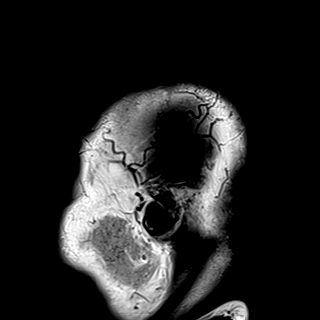

[Series 10: T2 · axial · 5.0mm · 0.72mm/px · z∈[-70,+73]mm · 2 of 25 slices shown (1 of 2)]
[im 1/25]
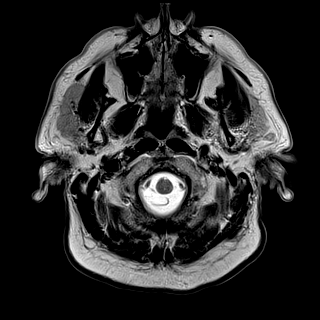
[im 25/25]
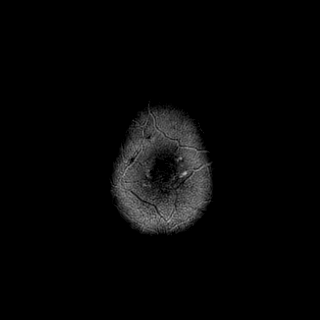

[Series 11: FLAIR · axial · 5.0mm · 0.45mm/px · z∈[-70,+73]mm · 2 of 25 slices shown]
[im 1/25]
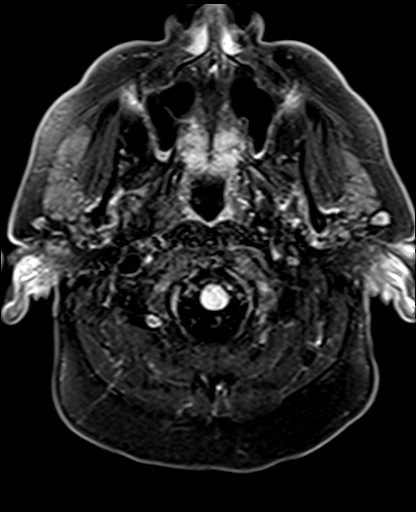
[im 25/25]
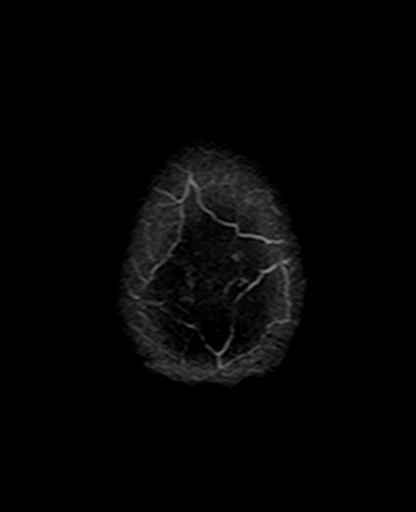

[Series 12: mag_images · axial · 3.0mm · 0.90mm/px · z∈[-86,+89]mm · 4 of 60 slices shown]
[im 1/60]
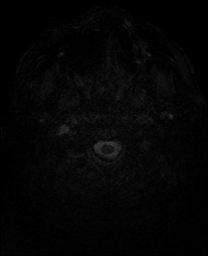
[im 20/60]
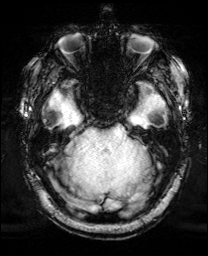
[im 40/60]
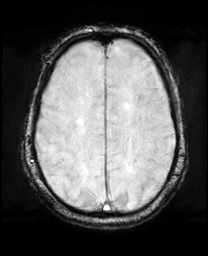
[im 60/60]
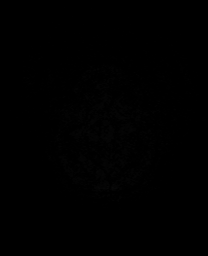

[Series 13: pha_images · axial · 3.0mm · 0.90mm/px · z∈[-86,+89]mm · 4 of 60 slices shown]
[im 1/60]
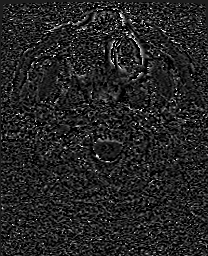
[im 20/60]
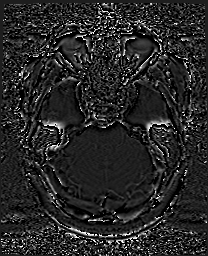
[im 40/60]
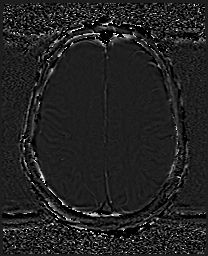
[im 60/60]
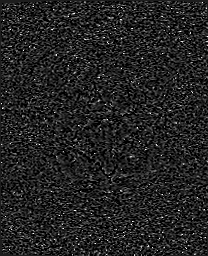

[Series 14: swi_images · axial · 3.0mm · 0.90mm/px · z∈[-86,+89]mm · 4 of 60 slices shown]
[im 1/60]
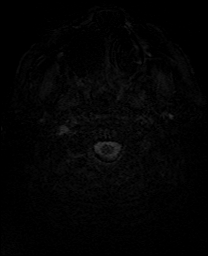
[im 20/60]
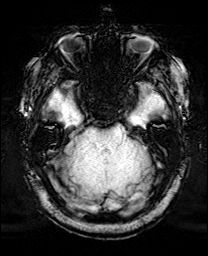
[im 40/60]
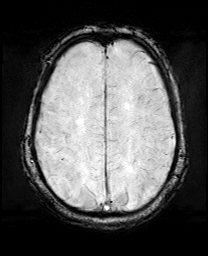
[im 60/60]
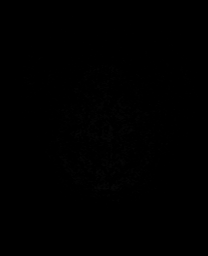

[Series 15: mip_images(sw) · axial · 24.0mm · 0.90mm/px · z∈[-76,+79]mm · 4 of 53 slices shown]
[im 1/53]
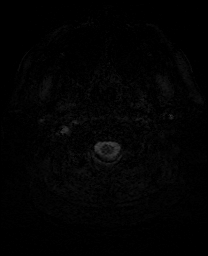
[im 18/53]
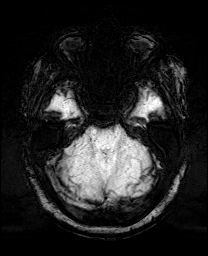
[im 35/53]
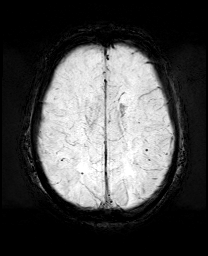
[im 53/53]
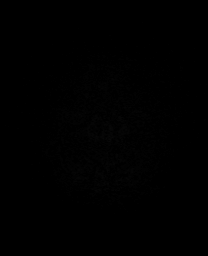

[Series 17: T2 · coronal · 5.0mm · 0.34mm/px · 2 of 29 slices shown (2 of 2)]
[im 1/29]
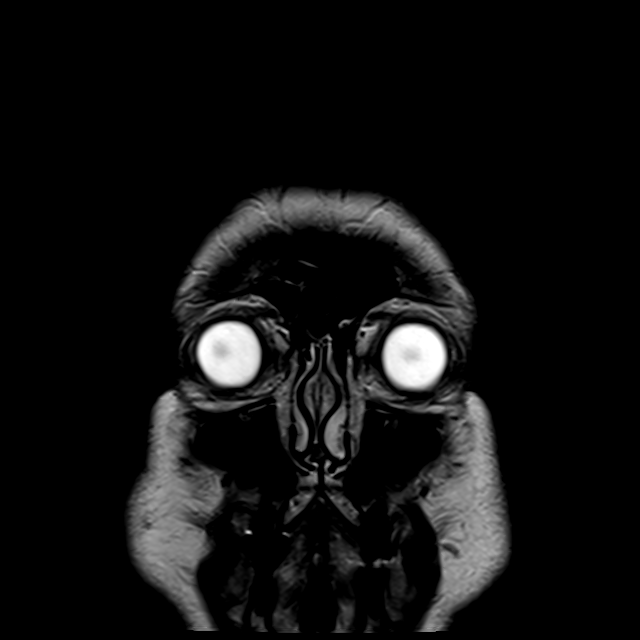
[im 29/29]
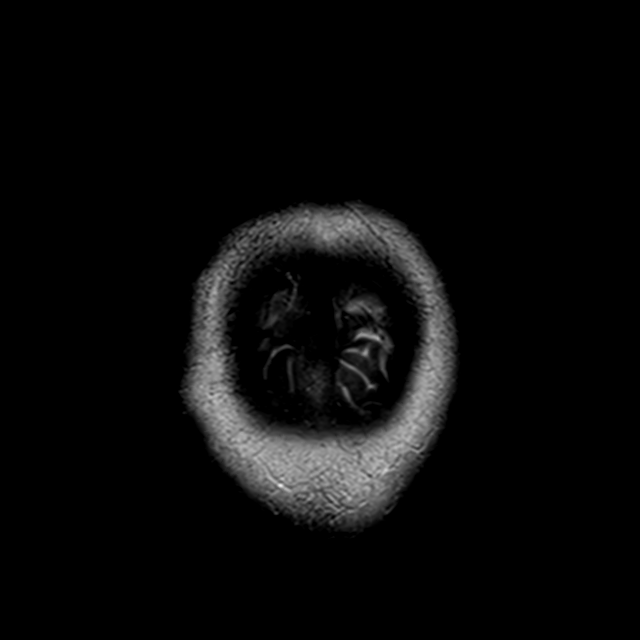

[44 of 48 positions shown; findings below may reference images not displayed]

FINDINGS: Brain:

There is no convincing evidence of acute infarct.

No evidence of intracranial mass.

No midline shift or extra-axial fluid collection.

2 cm chronic infarct within the left basal ganglia with associated
chronic hemosiderin deposition involving the left caudate head,
anterior limb of left internal capsule and portions of the left
lentiform nucleus. Additional small chronic lacunar infarcts are
present within the left of center pons and right cerebellum. There
is a background of moderate chronic small vessel ischemic disease.
There are few scattered chronic microhemorrhages within the
bilateral cerebral hemispheres.

Cerebral volume is normal for age.

Vascular: Flow voids maintained within the proximal large arterial
vessels.

Skull and upper cervical spine: No focal marrow lesion

Sinuses/Orbits: Visualized orbits demonstrate no acute abnormality.
Minimal ethmoid sinus mucosal thickening. Trace fluid within left
mastoid air cells.
IMPRESSION: 1. No evidence of acute intracranial abnormality.
2. 2 cm chronic infarct within the left basal ganglia. Additional
small chronic lacunar infarcts within the pons and right cerebellum.
There is a background of moderate chronic small vessel ischemic
disease.

## 2021-04-11 ENCOUNTER — Other Ambulatory Visit: Payer: Self-pay | Admitting: Physician Assistant

## 2021-04-13 DIAGNOSIS — Z23 Encounter for immunization: Secondary | ICD-10-CM | POA: Diagnosis not present

## 2021-04-18 DIAGNOSIS — H2513 Age-related nuclear cataract, bilateral: Secondary | ICD-10-CM | POA: Diagnosis not present

## 2021-04-27 DIAGNOSIS — D485 Neoplasm of uncertain behavior of skin: Secondary | ICD-10-CM | POA: Diagnosis not present

## 2021-04-27 DIAGNOSIS — Z85828 Personal history of other malignant neoplasm of skin: Secondary | ICD-10-CM | POA: Diagnosis not present

## 2021-04-29 ENCOUNTER — Other Ambulatory Visit: Payer: Self-pay

## 2021-04-29 ENCOUNTER — Encounter: Payer: Self-pay | Admitting: Nurse Practitioner

## 2021-04-29 ENCOUNTER — Ambulatory Visit (INDEPENDENT_AMBULATORY_CARE_PROVIDER_SITE_OTHER): Payer: Medicare Other | Admitting: Nurse Practitioner

## 2021-04-29 VITALS — BP 104/64 | HR 63 | Temp 98.4°F | Ht 69.0 in | Wt 239.6 lb

## 2021-04-29 DIAGNOSIS — R350 Frequency of micturition: Secondary | ICD-10-CM

## 2021-04-29 DIAGNOSIS — Z0000 Encounter for general adult medical examination without abnormal findings: Secondary | ICD-10-CM | POA: Diagnosis not present

## 2021-04-29 DIAGNOSIS — E785 Hyperlipidemia, unspecified: Secondary | ICD-10-CM | POA: Diagnosis not present

## 2021-04-29 DIAGNOSIS — E119 Type 2 diabetes mellitus without complications: Secondary | ICD-10-CM | POA: Diagnosis not present

## 2021-04-29 DIAGNOSIS — N400 Enlarged prostate without lower urinary tract symptoms: Secondary | ICD-10-CM | POA: Diagnosis not present

## 2021-04-29 DIAGNOSIS — I10 Essential (primary) hypertension: Secondary | ICD-10-CM | POA: Diagnosis not present

## 2021-04-29 DIAGNOSIS — N529 Male erectile dysfunction, unspecified: Secondary | ICD-10-CM

## 2021-04-29 LAB — POCT GLYCOSYLATED HEMOGLOBIN (HGB A1C): Hemoglobin A1C: 6.4 % — AB (ref 4.0–5.6)

## 2021-04-29 MED ORDER — METFORMIN HCL 500 MG PO TABS: 250.0000 mg | ORAL_TABLET | Freq: Two times a day (BID) | ORAL | 0 refills | Status: DC

## 2021-04-29 MED ORDER — SILDENAFIL CITRATE 100 MG PO TABS: 100.0000 mg | ORAL_TABLET | Freq: Every day | ORAL | 2 refills | Status: AC | PRN

## 2021-04-29 MED ORDER — FINASTERIDE 5 MG PO TABS: 5.0000 mg | ORAL_TABLET | Freq: Every day | ORAL | 1 refills | Status: DC

## 2021-04-29 NOTE — Progress Notes (Signed)
Subjective:   James Barnes is a 80 y.o. male who presents for Medicare Annual/Subsequent preventive examination. Started on low dose metformin at his most recent visit. States that he is doing well with no negative side effects from starting this medication. HgbA1c is 6.4, down from 6.6 at her last visit. He is continuing to have urinary frequency despite his blood sugars improving. He states that he did not get any relief from trial of low dose viagra. His check of renal functions were normal. Urinalysis was within normal limits. His biggest concern is about weight gain. Still not limiting his calorie intake as much as he should and has not been as conservative with his diet as he should be.  He denies other concerns or complaints today. He denies chest pain, chest pressure, or shortness of breath. He denies headaches or visual disturbances. He denies abdominal pain, nausea, vomiting, or changes in bowel or bladder habits.    Review of Systems    Review of Systems  Constitutional:  Negative for activity change, chills, fatigue and fever.  HENT:  Negative for congestion, postnasal drip, rhinorrhea, sinus pressure and sinus pain.   Eyes:        Recently had diabetic eye exam done at [facility]. He does need to get new glasses with no diabetic retinopathy.   Respiratory:  Negative for cough, chest tightness and wheezing.   Cardiovascular:  Negative for chest pain and palpitations.  Gastrointestinal:  Negative for constipation, diarrhea, nausea and vomiting.  Endocrine: Negative for cold intolerance, heat intolerance, polydipsia and polyuria.       Improved blood sugars since starting on low dose metformin.   Genitourinary:  Positive for frequency and urgency.       Persistent erectile dysfunction.   Musculoskeletal:  Negative for back pain and myalgias.  Skin:  Negative for rash.  Allergic/Immunologic: Negative.   Neurological:  Negative for dizziness, weakness and headaches.   Psychiatric/Behavioral:  Negative for dysphoric mood. The patient is not nervous/anxious.          Objective:  Physical Exam Vitals and nursing note reviewed.  Constitutional:      Appearance: Normal appearance.  HENT:     Head: Normocephalic and atraumatic.     Right Ear: Ear canal and external ear normal.     Left Ear: Ear canal and external ear normal.     Nose: Nose normal.     Mouth/Throat:     Mouth: Mucous membranes are moist.     Pharynx: Oropharynx is clear.  Eyes:     Conjunctiva/sclera: Conjunctivae normal.     Pupils: Pupils are equal, round, and reactive to light.  Neck:     Thyroid: No thyromegaly.     Vascular: No carotid bruit.  Cardiovascular:     Rate and Rhythm: Normal rate and regular rhythm.     Pulses: Normal pulses.     Heart sounds: Normal heart sounds.  Pulmonary:     Effort: Pulmonary effort is normal.     Breath sounds: Normal breath sounds. No wheezing.  Abdominal:     General: Bowel sounds are normal.     Palpations: Abdomen is soft.     Tenderness: There is no abdominal tenderness.  Musculoskeletal:        General: Normal range of motion.     Cervical back: Normal range of motion and neck supple.  Skin:    General: Skin is warm and dry.  Capillary Refill: Capillary refill takes less than 2 seconds.  Neurological:     General: No focal deficit present.     Mental Status: He is alert and oriented to person, place, and time.  Psychiatric:        Mood and Affect: Mood and affect normal.        Behavior: Behavior normal.        Judgment: Judgment normal.      Today's Vitals   04/29/21 0905  BP: 104/64  Pulse: 63  Temp: 98.4 F (36.9 C)  SpO2: 95%  Weight: 239 lb 9.6 oz (108.7 kg)  Height: 5\' 9"  (1.753 m)   Body mass index is 35.38 kg/m.  Advanced Directives 09/18/2019 08/16/2018 08/14/2018  Does Patient Have a Medical Advance Directive? No - Yes  Type of Advance Directive - Living will Healthcare Power of [place];Living will   Copy of [place] in Chart? - No - copy requested No - copy requested  Would patient like information on creating a medical advance directive? No - Patient declined - -    Current Medications (verified) Outpatient Encounter Medications as of 04/29/2021  Medication Sig   aspirin EC 81 MG tablet Take 1 tablet (81 mg total) by mouth daily.   atorvastatin (LIPITOR) 20 MG tablet Take 20 mg by mouth daily.   atorvastatin (LIPITOR) 40 MG tablet TAKE 1 TABLET BY MOUTH DAILY   hydrochlorothiazide (HYDRODIURIL) 25 MG tablet Take 1 tablet (25 mg total) by mouth daily.   losartan (COZAAR) 50 MG tablet TAKE 1 TABLET BY MOUTH DAILY.   potassium chloride (KLOR-CON) 10 MEQ tablet TAKE 1 TABLET BY MOUTH DAILY.   tamsulosin (FLOMAX) 0.4 MG CAPS capsule Take 1 capsule (0.4 mg total) by mouth every evening. **SCHEDULE A FOLLOW-UP APPT FOR FUTURE REFILLS**   [DISCONTINUED] finasteride (PROSCAR) 5 MG tablet Take 5 mg by mouth at bedtime.    [DISCONTINUED] metFORMIN (GLUCOPHAGE) 500 MG tablet Take 0.5 tablets (250 mg total) by mouth 2 (two) times daily with a meal.   [DISCONTINUED] sildenafil (VIAGRA) 50 MG tablet Take 1 tablet (50 mg total) by mouth daily as needed for erectile dysfunction.   [DISCONTINUED] tadalafil (CIALIS) 20 MG tablet Take 20 mg by mouth every other day as needed.   finasteride (PROSCAR) 5 MG tablet Take 1 tablet (5 mg total) by mouth at bedtime.   metFORMIN (GLUCOPHAGE) 500 MG tablet Take 0.5 tablets (250 mg total) by mouth 2 (two) times daily with a meal.   sildenafil (VIAGRA) 100 MG tablet Take 1 tablet (100 mg total) by mouth daily as needed for erectile dysfunction.   No facility-administered encounter medications on file as of 04/29/2021.    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    CAD (coronary artery disease)     status post CABG in 2008   Colon polyps    Double vision    Dyslipidemia    Hiatal hernia    HTN (hypertension)    Lightheadedness    Obesity    Sinus bradycardia    Hx of   Syncope    with negative echo/Myoview Feb 2013   Past Surgical History:  Procedure Laterality Date   BACK SURGERY  2015   lower   CARDIOVASCULAR STRESS TEST  2013   Negative   COLONOSCOPY  WITH PROPOFOL N/A 08/16/2018   Procedure: COLONOSCOPY WITH PROPOFOL;  Surgeon: Laurence Spates, MD;  Location: [facility];  Service: Endoscopy;  Laterality: N/A;   COLONOSCOPY WITH PROPOFOL N/A 09/18/2019   Procedure: COLONOSCOPY WITH PROPOFOL;  Surgeon: Laurence Spates, MD;  Location: [facility];  Service: Endoscopy;  Laterality: N/A;   CORONARY ARTERY BYPASS GRAFT  2008 x 3   with a left internal mammary artery graft to his left anterior descending, right internal mammary  artery graft to his right coronary artery, and  saphenous vein graft to his obtuse marginal.    POLYPECTOMY  08/16/2018   Procedure: POLYPECTOMY;  Surgeon: Laurence Spates, MD;  Location: [facility];  Service: Endoscopy;;   POLYPECTOMY  09/18/2019   Procedure: POLYPECTOMY;  Surgeon: Laurence Spates, MD;  Location: [facility];  Service: Endoscopy;;   US ECHOCARDIOGRAPHY  07-01-2010   EF 55-60%   US ECHOCARDIOGRAPHY  2013   Normal EF; Grade 2 diastolic dysfunction   Family History  Problem Relation Age of Onset   Heart attack Mother    Social History   Socioeconomic History   Marital status: Divorced    Spouse name: Not on file   Number of children: Not on file   Years of education: Not on file   Highest education level: Not on file  Occupational History   Not on file  Tobacco Use   Smoking status: Every Day    Pack years: 0.00    Types: Pipe   Smokeless tobacco: Never  Vaping Use   Vaping Use: Never used  Substance and Sexual Activity   Alcohol use: No   Drug use: No   Sexual activity: Not on file  Other Topics Concern   Not on  file  Social History Narrative   Not on file   Social Determinants of Health   Financial Resource Strain: Not on file  Food Insecurity: Not on file  Transportation Needs: Not on file  Physical Activity: Not on file  Stress: Not on file  Social Connections: Not on file    Tobacco Counseling Ready to quit: No Counseling given: Yes   Diabetic?yes     Activities of Daily Living In your present state of health, do you have any difficulty performing the following activities: 04/29/2021 01/27/2021  Hearing? Y N  Vision? Y N  Difficulty concentrating or making decisions? N N  Walking or climbing stairs? N N  Dressing or bathing? N N  Doing errands, shopping? N N  Some recent data might be hidden    Patient Care Team: Ronnell Freshwater, NP as PCP - General (Family Medicine) Nahser, Wonda Cheng, MD as PCP - Cardiology (Cardiology)  Indicate any recent Medical Services you may have received from other than [facility] providers in the past year (date may be approximate).     Assessment:  1. Encounter for Medicare annual wellness exam Annual medicare wellness exam today   2. Controlled type 2 diabetes mellitus without complication, without long-term current use of insulin (HCC) HgbA1c is 6.4 today. Tolerating metformin well. Continue metformin 250mg  twice daily. Monitor blood sugars closely and recheck HgbA1c at next visit - POCT glycosylated hemoglobin (Hb A1C) - metFORMIN (GLUCOPHAGE) 500 MG tablet; Take 0.5 tablets (250 mg total) by mouth 2 (two) times daily with a meal.  Dispense: 90 tablet; Refill: 0  3. Urine frequency Likely due to BPH. Continue finasteride as prescribed   4. Prostatic hypertrophy Continue finasteride daily. Refills provided today  - finasteride (PROSCAR) 5 MG  tablet; Take 1 tablet (5 mg total) by mouth at bedtime.  Dispense: 90 tablet; Refill: 1  5. Erectile dysfunction, unspecified erectile dysfunction type Increase viagra to 100mg  when needed for erectile  dysfunction.  - sildenafil (VIAGRA) 100 MG tablet; Take 1 tablet (100 mg total) by mouth daily as needed for erectile dysfunction.  Dispense: 30 tablet; Refill: 2  6. Primary hypertension Stable. Continue bp medication as prescribed   7. Dyslipidemia Stable. Continue statin as prescribed.     Depression Screen PHQ 2/9 Scores 04/29/2021 01/27/2021  PHQ - 2 Score 0 0  PHQ- 9 Score 0 1    Fall Risk Fall Risk  04/29/2021 01/27/2021  Falls in the past year? 0 0  Number falls in past yr: 0 -  Injury with Fall? 0 -  Follow up Falls evaluation completed Falls evaluation completed    [place]:  Any stairs in or around the home? No  If so, are there any without handrails? No  Home free of loose throw rugs in walkways, pet beds, electrical cords, etc? No  Adequate lighting in your home to reduce risk of falls? Yes   ASSISTIVE DEVICES UTILIZED TO PREVENT FALLS:  Life alert? No  Use of a cane, walker or w/c? No  Grab bars in the bathroom? No  Shower chair or bench in shower? No  Elevated toilet seat or a handicapped toilet? No   TIMED UP AND GO:  Was the test performed? Yes .  Length of time to ambulate 10 feet: 20  sec.   Gait steady and fast without use of assistive device  Cognitive Function:     6CIT Screen 04/29/2021  What Year? 0 points  What month? 0 points  What time? 0 points  Count back from 20 0 points  Months in reverse 4 points  Repeat phrase 4 points  Total Score 8    Immunizations Immunization History  Administered Date(s) Administered   Influenza,inj,Quad PF,6+ Mos 09/26/2016   PFIZER(Purple Top)SARS-COV-2 Vaccination 12/14/2019, 01/02/2020    TDAP status: Up to date  Flu Vaccine status: Due, Education has been provided regarding the importance of this vaccine. Advised may receive this vaccine at local pharmacy or Health Dept. Aware to provide a copy of the vaccination record if obtained from local pharmacy or Health  Dept. Verbalized acceptance and understanding.  Pneumococcal vaccine status: Due, Education has been provided regarding the importance of this vaccine. Advised may receive this vaccine at local pharmacy or Health Dept. Aware to provide a copy of the vaccination record if obtained from local pharmacy or Health Dept. Verbalized acceptance and understanding.  Covid-19 vaccine status: Completed vaccines  Qualifies for Shingles Vaccine? No   Zostavax completed No   Shingrix Completed?: No.    Education has been provided regarding the importance of this vaccine. Patient has been advised to call insurance company to determine out of pocket expense if they have not yet received this vaccine. Advised may also receive vaccine at local pharmacy or Health Dept. Verbalized acceptance and understanding.  Screening Tests Health Maintenance  Topic Date Due   FOOT EXAM  Never done   OPHTHALMOLOGY EXAM  Never done   Hepatitis C Screening  Never done   TETANUS/TDAP  Never done   Zoster Vaccines- Shingrix (1 of 2) Never done   PNA vac Low Risk Adult (1 of 2 - PCV13) Never done   COVID-19 Vaccine (3 - Pfizer risk series) 01/30/2020   INFLUENZA  VACCINE  06/27/2021   HEMOGLOBIN A1C  10/29/2021   HPV VACCINES  Aged Out    Health Maintenance  Health Maintenance Due  Topic Date Due   FOOT EXAM  Never done   OPHTHALMOLOGY EXAM  Never done   Hepatitis C Screening  Never done   TETANUS/TDAP  Never done   Zoster Vaccines- Shingrix (1 of 2) Never done   PNA vac Low Risk Adult (1 of 2 - PCV13) Never done   COVID-19 Vaccine (3 - Pfizer risk series) 01/30/2020    Colorectal cancer screening: Type of screening: Colonoscopy. Completed 2021. Repeat every 1 years  Lung Cancer Screening: (Low Dose CT Chest recommended if Age 31-80 years, 30 pack-year currently smoking OR have quit w/in 15years.) does not qualify.   Lung Cancer Screening Referral: n/a  Additional Screening:  Hepatitis C Screening: does not  qualify; Completed due to age  Vision Screening: Recommended annual ophthalmology exams for early detection of glaucoma and other disorders of the eye. Is the patient up to date with their annual eye exam?  Yes  Who is the provider or what is the name of the office in which the patient attends annual eye exams?[facility]  If pt is not established with a provider, would they like to be referred to a provider to establish care? No .   Dental Screening: Recommended annual dental exams for proper oral hygiene  Community Resource Referral / Chronic Care Management: CRR required this visit?  No   CCM required this visit?  No      Plan:     I have personally reviewed and noted the following in the patient's chart:   Medical and social history Use of alcohol, tobacco or illicit drugs  Current medications and supplements including opioid prescriptions. Patient is not currently taking opioid prescriptions. Functional ability and status Nutritional status Physical activity Advanced directives List of other physicians Hospitalizations, surgeries, and ER visits in previous 12 months Vitals Screenings to include cognitive, depression, and falls Referrals and appointments  In addition, I have reviewed and discussed with patient certain preventive protocols, quality metrics, and best practice recommendations. A written personalized care plan for preventive services as well as general preventive health recommendations were provided to patient.

## 2021-04-29 NOTE — Patient Instructions (Signed)
Diabetes Mellitus and Exercise Exercising regularly is important for overall health, especially for people who have diabetes mellitus. Exercising is not only about losing weight. It has many other health benefits, such as increasing muscle strength and bone density and reducing body fat and stress. This leads to improved fitness, flexibility, and endurance, all of which result in better overall health. What are the benefits of exercise if I have diabetes? Exercise has many benefits for people with diabetes. They include:  Helping to lower and control blood sugar (glucose).  Helping the body to respond better to the hormone insulin by improving insulin sensitivity.  Reducing how much insulin the body needs.  Lowering the risk for heart disease by: ? Lowering "bad" cholesterol and triglyceride levels. ? Increasing "good" cholesterol levels. ? Lowering blood pressure. ? Lowering blood glucose levels. What is my activity plan? Your health care provider or certified diabetes educator can help you make a plan for the type and frequency of exercise that works for you. This is called your activity plan. Be sure to:  Get at least 150 minutes of medium-intensity or high-intensity exercise each week. Exercises may include brisk walking, biking, or water aerobics.  Do stretching and strengthening exercises, such as yoga or weight lifting, at least 2 times a week.  Spread out your activity over at least 3 days of the week.  Get some form of physical activity each day. ? Do not go more than 2 days in a row without some kind of physical activity. ? Avoid being inactive for more than 90 minutes at a time. Take frequent breaks to walk or stretch.  Choose exercises or activities that you enjoy. Set realistic goals.  Start slowly and gradually increase your exercise intensity over time.   How do I manage my diabetes during exercise? Monitor your blood glucose  Check your blood glucose before and  after exercising. If your blood glucose is: ? 240 mg/dL (13.3 mmol/L) or higher before you exercise, check your urine for ketones. These are chemicals created by the liver. If you have ketones in your urine, do not exercise until your blood glucose returns to normal. ? 100 mg/dL (5.6 mmol/L) or lower, eat a snack containing 15-20 grams of carbohydrate. Check your blood glucose 15 minutes after the snack to make sure that your glucose level is above 100 mg/dL (5.6 mmol/L) before you start your exercise.  Know the symptoms of low blood glucose (hypoglycemia) and how to treat it. Your risk for hypoglycemia increases during and after exercise. Follow these tips and your health care provider's instructions  Keep a carbohydrate snack that is fast-acting for use before, during, and after exercise to help prevent or treat hypoglycemia.  Avoid injecting insulin into areas of the body that are going to be exercised. For example, avoid injecting insulin into: ? Your arms, when you are about to play tennis. ? Your legs, when you are about to go jogging.  Keep records of your exercise habits. Doing this can help you and your health care provider adjust your diabetes management plan as needed. Write down: ? Food that you eat before and after you exercise. ? Blood glucose levels before and after you exercise. ? The type and amount of exercise you have done.  Work with your health care provider when you start a new exercise or activity. He or she may need to: ? Make sure that the activity is safe for you. ? Adjust your insulin, other medicines, and food that   you eat.  Drink plenty of water while you exercise. This prevents loss of water (dehydration) and problems caused by a lot of heat in the body (heat stroke).   Where to find more information  American Diabetes Association: www.diabetes.org Summary  Exercising regularly is important for overall health, especially for people who have diabetes  mellitus.  Exercising has many health benefits. It increases muscle strength and bone density and reduces body fat and stress. It also lowers and controls blood glucose.  Your health care provider or certified diabetes educator can help you make an activity plan for the type and frequency of exercise that works for you.  Work with your health care provider to make sure any new activity is safe for you. Also work with your health care provider to adjust your insulin, other medicines, and the food you eat. This information is not intended to replace advice given to you by your health care provider. Make sure you discuss any questions you have with your health care provider. Document Revised: 08/11/2019 Document Reviewed: 08/11/2019 Elsevier Patient Education  2021 [place].  Benign Prostatic Hyperplasia  Benign prostatic hyperplasia (BPH) is an enlarged prostate gland that is caused by the normal aging process and not by cancer. The prostate is a walnut-sized gland that is involved in the production of semen. It is located in front of the rectum and below the bladder. The bladder stores urine and the urethra is the tube that carries the urine out of the body. The prostate may get bigger as a man gets older. An enlarged prostate can press on the urethra. This can make it harder to pass urine. The build-up of urine in the bladder can cause infection. Back pressure and infection may progress to bladder damage and kidney (renal) failure. What are the causes? This condition is part of a normal aging process. However, not all men develop problems from this condition. If the prostate enlarges away from the urethra, urine flow will not be blocked. If it enlarges toward the urethra and compresses it, there will be problems passing urine. What increases the risk? This condition is more likely to develop in men over the age of 12 years. What are the signs or symptoms? Symptoms of this condition  include:  Getting up often during the night to urinate.  Needing to urinate frequently during the day.  Difficulty starting urine flow.  Decrease in size and strength of your urine stream.  Leaking (dribbling) after urinating.  Inability to pass urine. This needs immediate treatment.  Inability to completely empty your bladder.  Pain when you pass urine. This is more common if there is also an infection.  Urinary tract infection (UTI). How is this diagnosed? This condition is diagnosed based on your medical history, a physical exam, and your symptoms. Tests will also be done, such as:  A post-void bladder scan. This measures any amount of urine that may remain in your bladder after you finish urinating.  A digital rectal exam. In a rectal exam, your health care provider checks your prostate by putting a lubricated, gloved finger into your rectum to feel the back of your prostate gland. This exam detects the size of your gland and any abnormal lumps or growths.  An exam of your urine (urinalysis).  A prostate specific antigen (PSA) screening. This is a blood test used to screen for prostate cancer.  An ultrasound. This test uses sound waves to electronically produce a picture of your prostate gland. Your  health care provider may refer you to a specialist in kidney and prostate diseases (urologist). How is this treated? Once symptoms begin, your health care provider will monitor your condition (active surveillance or watchful waiting). Treatment for this condition will depend on the severity of your condition. Treatment may include:  Observation and yearly exams. This may be the only treatment needed if your condition and symptoms are mild.  Medicines to relieve your symptoms, including: ? Medicines to shrink the prostate. ? Medicines to relax the muscle of the prostate.  Surgery in severe cases. Surgery may include: ? Prostatectomy. In this procedure, the prostate tissue is  removed completely through an open incision or with a laparoscope or robotics. ? Transurethral resection of the prostate (TURP). In this procedure, a tool is inserted through the opening at the tip of the penis (urethra). It is used to cut away tissue of the inner core of the prostate. The pieces are removed through the same opening of the penis. This removes the blockage. ? Transurethral incision (TUIP). In this procedure, small cuts are made in the prostate. This lessens the prostate's pressure on the urethra. ? Transurethral microwave thermotherapy (TUMT). This procedure uses microwaves to create heat. The heat destroys and removes a small amount of prostate tissue. ? Transurethral needle ablation (TUNA). This procedure uses radio frequencies to destroy and remove a small amount of prostate tissue. ? Interstitial laser coagulation ([place]). This procedure uses a laser to destroy and remove a small amount of prostate tissue. ? Transurethral electrovaporization (TUVP). This procedure uses electrodes to destroy and remove a small amount of prostate tissue. ? Prostatic urethral lift. This procedure inserts an implant to push the lobes of the prostate away from the urethra. Follow these instructions at home:  Take over-the-counter and prescription medicines only as told by your health care provider.  Monitor your symptoms for any changes. Contact your health care provider with any changes.  Avoid drinking large amounts of liquid before going to bed or out in public.  Avoid or reduce how much caffeine or alcohol you drink.  Give yourself time when you urinate.  Keep all follow-up visits as told by your health care provider. This is important. Contact a health care provider if:  You have unexplained back pain.  Your symptoms do not get better with treatment.  You develop side effects from the medicine you are taking.  Your urine becomes very dark or has a bad smell.  Your lower abdomen  becomes distended and you have trouble passing your urine. Get help right away if:  You have a fever or chills.  You suddenly cannot urinate.  You feel lightheaded, or very dizzy, or you faint.  There are large amounts of blood or clots in the urine.  Your urinary problems become hard to manage.  You develop moderate to severe low back or flank pain. The flank is the side of your body between the ribs and the hip. These symptoms may represent a serious problem that is an emergency. Do not wait to see if the symptoms will go away. Get medical help right away. Call your local emergency services (911 in the [place] Do not drive yourself to the hospital. Summary  Benign prostatic hyperplasia (BPH) is an enlarged prostate that is caused by the normal aging process and not by cancer.  An enlarged prostate can press on the urethra. This can make it hard to pass urine.  This condition is part of a normal aging process  and is more likely to develop in men over the age of 40 years.  Get help right away if you suddenly cannot urinate. This information is not intended to replace advice given to you by your health care provider. Make sure you discuss any questions you have with your health care provider. Document Revised: 07/22/2020 Document Reviewed: 07/22/2020 Elsevier Patient Education  [place].

## 2021-05-08 ENCOUNTER — Encounter: Payer: Self-pay | Admitting: Nurse Practitioner

## 2021-05-08 DIAGNOSIS — Z0000 Encounter for general adult medical examination without abnormal findings: Secondary | ICD-10-CM | POA: Insufficient documentation

## 2021-05-08 DIAGNOSIS — N400 Enlarged prostate without lower urinary tract symptoms: Secondary | ICD-10-CM | POA: Insufficient documentation

## 2021-05-09 DIAGNOSIS — C4402 Squamous cell carcinoma of skin of lip: Secondary | ICD-10-CM | POA: Diagnosis not present

## 2021-05-09 DIAGNOSIS — D485 Neoplasm of uncertain behavior of skin: Secondary | ICD-10-CM | POA: Diagnosis not present

## 2021-05-09 DIAGNOSIS — Z85828 Personal history of other malignant neoplasm of skin: Secondary | ICD-10-CM | POA: Diagnosis not present

## 2021-05-20 ENCOUNTER — Other Ambulatory Visit: Payer: Self-pay | Admitting: Physician Assistant

## 2021-06-01 DIAGNOSIS — C4402 Squamous cell carcinoma of skin of lip: Secondary | ICD-10-CM | POA: Diagnosis not present

## 2021-06-01 DIAGNOSIS — Z85828 Personal history of other malignant neoplasm of skin: Secondary | ICD-10-CM | POA: Diagnosis not present

## 2021-06-08 DIAGNOSIS — D485 Neoplasm of uncertain behavior of skin: Secondary | ICD-10-CM | POA: Diagnosis not present

## 2021-06-08 DIAGNOSIS — Z85828 Personal history of other malignant neoplasm of skin: Secondary | ICD-10-CM | POA: Diagnosis not present

## 2021-06-08 DIAGNOSIS — D487 Neoplasm of uncertain behavior of other specified sites: Secondary | ICD-10-CM | POA: Diagnosis not present

## 2021-06-23 ENCOUNTER — Ambulatory Visit (INDEPENDENT_AMBULATORY_CARE_PROVIDER_SITE_OTHER): Payer: Medicare Other | Admitting: Nurse Practitioner

## 2021-06-23 ENCOUNTER — Other Ambulatory Visit: Payer: Self-pay

## 2021-06-23 ENCOUNTER — Encounter: Payer: Self-pay | Admitting: Nurse Practitioner

## 2021-06-23 VITALS — BP 106/67 | HR 75 | Temp 97.7°F | Ht 69.0 in | Wt 237.9 lb

## 2021-06-23 DIAGNOSIS — K921 Melena: Secondary | ICD-10-CM | POA: Diagnosis not present

## 2021-06-23 DIAGNOSIS — R5383 Other fatigue: Secondary | ICD-10-CM

## 2021-06-23 DIAGNOSIS — K621 Rectal polyp: Secondary | ICD-10-CM

## 2021-06-23 NOTE — Progress Notes (Signed)
Established Patient Office Visit  Subjective:  Patient ID: James Barnes, male    DOB: 01-Feb-1941  Age: 80 y.o. MRN: [id number]  CC:  Chief Complaint  Patient presents with   Rectal Bleeding    HPI James Barnes presents for  He has had change in bowel habits for past 2 to 3 months.  States he did not mention the change in bowel habits to anyone.  Today he noticed small amount of blood in the stool.  States it was bright red in nature.  States that his last colonoscopy was good but the colonoscopy before that he had approximately 26 polyps removed from the colon.  He does have a GI provider. He is scheduled for appointment in November for screening colonoscopy, however, the provider he has seen in the past, has retired. patient does state he has some severe fatigue.  He is unsure if the fatigue has anything to do with bowel changes.  He has gone through a great deal of emotional strife over the last year and a half or so.  Sister passed away at the end of 02-08-2019.  He and his family have been going through lots of administrative affairs.  Feels like fatigue may be related to increased personal stress.  He denies intestinal cramping or diarrhea.  He denies nausea and vomiting.  He denies fever or chills.  States his appetite is unchanged.  Rectal exam revealed two small growths which may be polyps. Hemoccult card slightly positive for blood today.  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  He also had a benign growth on the right side of his nose that has been removed.  He still has stitches from this procedure.  He is due to go back next week for follow-up.  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    CAD (coronary artery disease)    status post CABG in 02/08/07   Colon polyps    Double vision    Dyslipidemia    Hiatal hernia    HTN (hypertension)    Lightheadedness    Obesity    Sinus  bradycardia    Hx of   Syncope    with negative echo/Myoview 02/09/12    Past Surgical History:  Procedure Laterality Date   BACK SURGERY  02-08-14   lower   CARDIOVASCULAR STRESS TEST  February 09, 2012   Negative   COLONOSCOPY WITH PROPOFOL N/A 08/16/2018   Procedure: COLONOSCOPY WITH PROPOFOL;  Surgeon: Laurence Spates, MD;  Location: [facility];  Service: Endoscopy;  Laterality: N/A;   COLONOSCOPY WITH PROPOFOL N/A 09/18/2019   Procedure: COLONOSCOPY WITH PROPOFOL;  Surgeon: Laurence Spates, MD;  Location: [facility];  Service: Endoscopy;  Laterality: N/A;   CORONARY ARTERY BYPASS GRAFT  Feb 08, 2007 x 3   with a left internal mammary artery graft to his left anterior descending, right internal mammary  artery graft to his right coronary artery, and  saphenous vein graft to his obtuse marginal.    POLYPECTOMY  08/16/2018   Procedure: POLYPECTOMY;  Surgeon: Laurence Spates, MD;  Location: [facility];  Service: Endoscopy;;   POLYPECTOMY  09/18/2019   Procedure: POLYPECTOMY;  Surgeon: Laurence Spates, MD;  Location: [facility];  Service: Endoscopy;;   US ECHOCARDIOGRAPHY  07-01-2010   EF 55-60%   US ECHOCARDIOGRAPHY  09-Feb-2012   Normal  EF; Grade 2 diastolic dysfunction    Family History  Problem Relation Age of Onset   Heart attack Mother     Social History   Socioeconomic History   Marital status: Divorced    Spouse name: Not on file   Number of children: Not on file   Years of education: Not on file   Highest education level: Not on file  Occupational History   Not on file  Tobacco Use   Smoking status: Every Day    Types: Pipe   Smokeless tobacco: Never  Vaping Use   Vaping Use: Never used  Substance and Sexual Activity   Alcohol use: No   Drug use: No   Sexual activity: Not on file  Other Topics Concern   Not on file  Social History Narrative   Not on file   Social Determinants of Health   Financial Resource Strain: Not on file  Food Insecurity: Not on file  Transportation Needs:  Not on file  Physical Activity: Not on file  Stress: Not on file  Social Connections: Not on file  Intimate Partner Violence: Not on file    Outpatient Medications Prior to Visit  Medication Sig Dispense Refill   aspirin EC 81 MG tablet Take 1 tablet (81 mg total) by mouth daily.     atorvastatin (LIPITOR) 20 MG tablet Take 20 mg by mouth daily.     atorvastatin (LIPITOR) 40 MG tablet TAKE 1 TABLET BY MOUTH DAILY 90 tablet 0   finasteride (PROSCAR) 5 MG tablet Take 1 tablet (5 mg total) by mouth at bedtime. 90 tablet 1   hydrochlorothiazide (HYDRODIURIL) 25 MG tablet Take 1 tablet (25 mg total) by mouth daily. 90 tablet 3   losartan (COZAAR) 50 MG tablet TAKE 1 TABLET BY MOUTH DAILY. 90 tablet 2   metFORMIN (GLUCOPHAGE) 500 MG tablet Take 0.5 tablets (250 mg total) by mouth 2 (two) times daily with a meal. 90 tablet 0   potassium chloride (KLOR-CON) 10 MEQ tablet TAKE 1 TABLET BY MOUTH DAILY. 90 tablet 2   sildenafil (VIAGRA) 100 MG tablet Take 1 tablet (100 mg total) by mouth daily as needed for erectile dysfunction. 30 tablet 2   tamsulosin (FLOMAX) 0.4 MG CAPS capsule Take 1 capsule (0.4 mg total) by mouth every evening. **SCHEDULE A FOLLOW-UP APPT FOR FUTURE REFILLS** 90 capsule 3   No facility-administered medications prior to visit.    Allergies  Allergen Reactions   Atorvastatin Other (See Comments)    Muscle aches    Crestor [Rosuvastatin] Other (See Comments)    Muscle aches    Zetia [Ezetimibe] Other (See Comments)    Muscle aches     ROS Review of Systems  Constitutional:  Positive for fatigue. Negative for activity change, appetite change, chills, diaphoresis and unexpected weight change.  HENT:  Negative for congestion, postnasal drip, rhinorrhea, sinus pressure, sinus pain and sore throat.   Eyes: Negative.   Respiratory:  Negative for chest tightness, shortness of breath and wheezing.   Cardiovascular:  Negative for chest pain and palpitations.   Gastrointestinal:  Positive for blood in stool. Negative for abdominal distention, abdominal pain, constipation, nausea, rectal pain and vomiting.       Has been having frequent, loose stools for the last 2 to 3 months.  Endocrine: Negative for cold intolerance, heat intolerance, polydipsia and polyuria.  Genitourinary: Negative.   Musculoskeletal:  Negative for arthralgias and joint swelling.  Allergic/Immunologic: Negative.   Neurological:  Negative for  dizziness and headaches.  Hematological: Negative.   Psychiatric/Behavioral:  Positive for dysphoric mood.      Objective:    Physical Exam Vitals and nursing note reviewed.  Constitutional:      Appearance: Normal appearance. He is well-developed. He is obese.  HENT:     Head: Normocephalic and atraumatic.     Nose: Nose normal.     Mouth/Throat:     Mouth: Mucous membranes are moist.     Pharynx: Oropharynx is clear.  Eyes:     Extraocular Movements: Extraocular movements intact.     Conjunctiva/sclera: Conjunctivae normal.     Pupils: Pupils are equal, round, and reactive to light.  Cardiovascular:     Rate and Rhythm: Normal rate and regular rhythm.     Pulses: Normal pulses.     Heart sounds: Normal heart sounds.  Pulmonary:     Effort: Pulmonary effort is normal.     Breath sounds: Normal breath sounds.  Abdominal:     General: Bowel sounds are normal.     Palpations: Abdomen is soft.     Tenderness: There is abdominal tenderness.     Comments: Mild, generalized abdominal tenderness present with palpation.  Test for occult blood in the stool was slightly positive today.  There were 2 polyps like growths external rectum.  They are white in color.  Very tender with light palpation.  No frank blood noticeable at this time.  Musculoskeletal:        General: Normal range of motion.     Cervical back: Normal range of motion and neck supple.  Lymphadenopathy:     Cervical: No cervical adenopathy.  Skin:    General: Skin  is warm and dry.     Capillary Refill: Capillary refill takes less than 2 seconds.  Neurological:     General: No focal deficit present.     Mental Status: He is alert and oriented to person, place, and time.  Psychiatric:        Mood and Affect: Mood normal.        Behavior: Behavior normal.        Thought Content: Thought content normal.        Judgment: Judgment normal.   Today's Vitals   06/23/21 1533  BP: 106/67  Pulse: 75  Temp: 97.7 F (36.5 C)  SpO2: 95%  Weight: 237 lb 14.4 oz (107.9 kg)  Height: 5' 9"  (1.753 m)   Body mass index is 35.13 kg/m.   Wt Readings from Last 3 Encounters:  06/23/21 237 lb 14.4 oz (107.9 kg)  04/29/21 239 lb 9.6 oz (108.7 kg)  01/27/21 244 lb 1.6 oz (110.7 kg)     Health Maintenance Due  Topic Date Due   FOOT EXAM  Never done   TETANUS/TDAP  Never done   Zoster Vaccines- Shingrix (1 of 2) Never done   PNA vac Low Risk Adult (1 of 2 - PCV13) Never done   COVID-19 Vaccine (3 - Pfizer risk series) 01/30/2020   INFLUENZA VACCINE  06/27/2021    There are no preventive care reminders to display for this patient.  Lab Results  Component Value Date   TSH 3.11 01/03/2021   Lab Results  Component Value Date   WBC 5.9 06/23/2021   HGB 13.8 06/23/2021   HCT 41.1 06/23/2021   MCV 96 06/23/2021   PLT 298 06/23/2021   Lab Results  Component Value Date   NA 146 (H) 06/23/2021   K CANCELED  06/23/2021   CO2 18 (L) 06/23/2021   GLUCOSE CANCELED 06/23/2021   BUN 23 06/23/2021   CREATININE 2.17 (H) 06/23/2021   BILITOT 0.4 10/01/2019   ALKPHOS 70 01/03/2021   AST 18 01/03/2021   ALT 23 01/03/2021   PROT 6.3 10/01/2019   ALBUMIN 4.3 10/01/2019   CALCIUM 10.2 06/23/2021   EGFR 30 (L) 06/23/2021   GFR 56.98 (L) 08/01/2012   Lab Results  Component Value Date   CHOL 119 01/03/2021   Lab Results  Component Value Date   HDL 31 (A) 01/03/2021   Lab Results  Component Value Date   LDLCALC 60 01/03/2021   Lab Results  Component  Value Date   TRIG 140 01/03/2021   Lab Results  Component Value Date   CHOLHDL 3.3 12/16/2020   Lab Results  Component Value Date   HGBA1C 6.4 (A) 04/29/2021      Assessment & Plan:  1. Blood in stool Stool card for occult blood was scantly positive today.  A referral to GI made for further evaluation and treatment. - CBC with Differential/Platelet - Basic Metabolic Panel (BMET) - Ambulatory referral to Gastroenterology  2. Rectal polyp To small, polyp-like lesions noted on external rectum.  They are tender.  Refer to GI for further evaluation and treatment. - Ambulatory referral to Gastroenterology  3. Fatigue, unspecified type Check CBC and BMP for further evaluation.  We will discuss results with patient when results are available. - CBC with Differential/Platelet - Basic Metabolic Panel (BMET)   Problem List Items Addressed This Visit       Digestive   Rectal polyp   Relevant Orders   Ambulatory referral to Gastroenterology     Other   Blood in stool - Primary   Relevant Orders   CBC with Differential/Platelet (Completed)   Basic Metabolic Panel (BMET) (Completed)   Ambulatory referral to Gastroenterology   Fatigue   Relevant Orders   CBC with Differential/Platelet (Completed)   Basic Metabolic Panel (BMET) (Completed)    This note was dictated using Dragon Voice Recognition Software. Rapid proofreading was performed to expedite the delivery of the information. Despite proofreading, phonetic errors will occur which are common with this voice recognition software. Please take this into consideration. If there are any concerns, please contact our office.     Follow-up: Return for prn worsening or persistent symptoms.    Ronnell Freshwater, NP

## 2021-06-24 LAB — CBC WITH DIFFERENTIAL/PLATELET
Basophils Absolute: 0.1 10*3/uL (ref 0.0–0.2)
Basos: 1 %
EOS (ABSOLUTE): 0.2 10*3/uL (ref 0.0–0.4)
Eos: 3 %
Hematocrit: 41.1 % (ref 37.5–51.0)
Hemoglobin: 13.8 g/dL (ref 13.0–17.7)
Immature Grans (Abs): 0 10*3/uL (ref 0.0–0.1)
Immature Granulocytes: 0 %
Lymphocytes Absolute: 2.2 10*3/uL (ref 0.7–3.1)
Lymphs: 37 %
MCH: 32.2 pg (ref 26.6–33.0)
MCHC: 33.6 g/dL (ref 31.5–35.7)
MCV: 96 fL (ref 79–97)
Monocytes Absolute: 0.6 10*3/uL (ref 0.1–0.9)
Monocytes: 10 %
Neutrophils Absolute: 2.9 10*3/uL (ref 1.4–7.0)
Neutrophils: 49 %
Platelets: 298 10*3/uL (ref 150–450)
RBC: 4.28 x10E6/uL (ref 4.14–5.80)
RDW: 13.2 % (ref 11.6–15.4)
WBC: 5.9 10*3/uL (ref 3.4–10.8)

## 2021-06-24 LAB — BASIC METABOLIC PANEL
BUN/Creatinine Ratio: 11 (ref 10–24)
BUN: 23 mg/dL (ref 8–27)
CO2: 18 mmol/L — ABNORMAL LOW (ref 20–29)
Calcium: 10.2 mg/dL (ref 8.6–10.2)
Chloride: 108 mmol/L — ABNORMAL HIGH (ref 96–106)
Creatinine, Ser: 2.17 mg/dL — ABNORMAL HIGH (ref 0.76–1.27)
Sodium: 146 mmol/L — ABNORMAL HIGH (ref 134–144)
eGFR: 30 mL/min/{1.73_m2} — ABNORMAL LOW (ref >59)

## 2021-06-24 LAB — SPECIMEN STATUS REPORT

## 2021-07-03 DIAGNOSIS — R5383 Other fatigue: Secondary | ICD-10-CM | POA: Insufficient documentation

## 2021-07-03 DIAGNOSIS — K921 Melena: Secondary | ICD-10-CM | POA: Insufficient documentation

## 2021-07-03 DIAGNOSIS — K621 Rectal polyp: Secondary | ICD-10-CM | POA: Insufficient documentation

## 2021-07-03 NOTE — Progress Notes (Signed)
Renal functions moderately elevated from prior checks. Will review medications and adjust as indicated.

## 2021-08-02 ENCOUNTER — Encounter: Payer: Self-pay | Admitting: Nurse Practitioner

## 2021-08-02 ENCOUNTER — Ambulatory Visit (INDEPENDENT_AMBULATORY_CARE_PROVIDER_SITE_OTHER): Payer: Medicare Other | Admitting: Nurse Practitioner

## 2021-08-02 ENCOUNTER — Other Ambulatory Visit: Payer: Self-pay

## 2021-08-02 VITALS — BP 99/65 | HR 67 | Temp 97.2°F | Ht 69.0 in | Wt 241.5 lb

## 2021-08-02 DIAGNOSIS — E1169 Type 2 diabetes mellitus with other specified complication: Secondary | ICD-10-CM | POA: Diagnosis not present

## 2021-08-02 DIAGNOSIS — E119 Type 2 diabetes mellitus without complications: Secondary | ICD-10-CM

## 2021-08-02 DIAGNOSIS — Z6835 Body mass index (BMI) 35.0-35.9, adult: Secondary | ICD-10-CM | POA: Diagnosis not present

## 2021-08-02 DIAGNOSIS — I10 Essential (primary) hypertension: Secondary | ICD-10-CM

## 2021-08-02 DIAGNOSIS — E785 Hyperlipidemia, unspecified: Secondary | ICD-10-CM | POA: Diagnosis not present

## 2021-08-02 LAB — POCT GLYCOSYLATED HEMOGLOBIN (HGB A1C): Hemoglobin A1C: 6.6 % — AB (ref 4.0–5.6)

## 2021-08-02 NOTE — Progress Notes (Signed)
Established Patient Office Visit  Subjective:  Patient ID: James Barnes, male    DOB: Mar 19, 1941  Age: 80 y.o. MRN: [id number]  CC:  Chief Complaint  Patient presents with   Follow-up   Diabetes   The patient presents for routine follow-up.  Blood sugars doing well.  His hemoglobin A1c is stable at 6.6 today.  Blood pressure is well controlled.  He has no concerns or complaints today.  He denies chest pain, chest pressure, or shortness of breath. He denies headaches or visual disturbances. He denies abdominal pain, nausea, vomiting, or changes in bowel or bladder habits.    Diabetes He presents for his follow-up diabetic visit. He has type 2 diabetes mellitus. No MedicAlert identification noted. His disease course has been stable. There are no hypoglycemic associated symptoms. Pertinent negatives for hypoglycemia include no dizziness, headaches or nervousness/anxiousness. There are no diabetic associated symptoms. Pertinent negatives for diabetes include no chest pain, no fatigue, no polydipsia, no polyuria and no weakness. There are no hypoglycemic complications. There are no diabetic complications. Risk factors for coronary artery disease include dyslipidemia, diabetes mellitus, hypertension, male sex and obesity. Current diabetic treatment includes oral agent (monotherapy). He is compliant with treatment all of the time. His weight is stable. He is following a generally healthy diet. Meal planning includes avoidance of concentrated sweets. An ACE inhibitor/angiotensin II receptor blocker is being taken.     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    CAD (coronary artery disease)    status post CABG in 2008   Colon polyps    Double vision    Dyslipidemia    Hiatal hernia    HTN (hypertension)    Lightheadedness    Obesity    Sinus bradycardia    Hx of   Syncope    with negative echo/Myoview Feb 2013    Past Surgical  History:  Procedure Laterality Date   BACK SURGERY  2015   lower   CARDIOVASCULAR STRESS TEST  2013   Negative   COLONOSCOPY WITH PROPOFOL N/A 08/16/2018   Procedure: COLONOSCOPY WITH PROPOFOL;  Surgeon: Laurence Spates, MD;  Location: [facility];  Service: Endoscopy;  Laterality: N/A;   COLONOSCOPY WITH PROPOFOL N/A 09/18/2019   Procedure: COLONOSCOPY WITH PROPOFOL;  Surgeon: Laurence Spates, MD;  Location: [facility];  Service: Endoscopy;  Laterality: N/A;   CORONARY ARTERY BYPASS GRAFT  2008 x 3   with a left internal mammary artery graft to his left anterior descending, right internal mammary  artery graft to his right coronary artery, and  saphenous vein graft to his obtuse marginal.    POLYPECTOMY  08/16/2018   Procedure: POLYPECTOMY;  Surgeon: Laurence Spates, MD;  Location: [facility];  Service: Endoscopy;;   POLYPECTOMY  09/18/2019   Procedure: POLYPECTOMY;  Surgeon: Laurence Spates, MD;  [facility];  Service: Endoscopy;;   US ECHOCARDIOGRAPHY  07-01-2010   EF 55-60%   US ECHOCARDIOGRAPHY  2013   Normal EF; Grade 2 diastolic dysfunction    Family History  Problem Relation Age of Onset   Heart attack Mother     Social History   Socioeconomic History   Marital status: Divorced    Spouse name: Not on file   Number of children: Not on file   Years of education: Not on file   Highest education level: Not on file  Occupational History  Not on file  Tobacco Use   Smoking status: Every Day    Types: Pipe   Smokeless tobacco: Never  Vaping Use   Vaping Use: Never used  Substance and Sexual Activity   Alcohol use: No   Drug use: No   Sexual activity: Not on file  Other Topics Concern   Not on file  Social History Narrative   Not on file   Social Determinants of Health   Financial Resource Strain: Not on file  Food Insecurity: Not on file  Transportation Needs: Not on file  Physical Activity: Not on file  Stress: Not on file  Social Connections: Not  on file  Intimate Partner Violence: Not on file    Outpatient Medications Prior to Visit  Medication Sig Dispense Refill   aspirin EC 81 MG tablet Take 1 tablet (81 mg total) by mouth daily.     atorvastatin (LIPITOR) 20 MG tablet Take 20 mg by mouth daily.     atorvastatin (LIPITOR) 40 MG tablet TAKE 1 TABLET BY MOUTH DAILY 90 tablet 0   finasteride (PROSCAR) 5 MG tablet Take 1 tablet (5 mg total) by mouth at bedtime. 90 tablet 1   hydrochlorothiazide (HYDRODIURIL) 25 MG tablet Take 1 tablet (25 mg total) by mouth daily. 90 tablet 3   losartan (COZAAR) 50 MG tablet TAKE 1 TABLET BY MOUTH DAILY. 90 tablet 2   metFORMIN (GLUCOPHAGE) 500 MG tablet Take 0.5 tablets (250 mg total) by mouth 2 (two) times daily with a meal. 90 tablet 0   potassium chloride (KLOR-CON) 10 MEQ tablet TAKE 1 TABLET BY MOUTH DAILY. 90 tablet 2   sildenafil (VIAGRA) 100 MG tablet Take 1 tablet (100 mg total) by mouth daily as needed for erectile dysfunction. 30 tablet 2   tamsulosin (FLOMAX) 0.4 MG CAPS capsule Take 1 capsule (0.4 mg total) by mouth every evening. **SCHEDULE A FOLLOW-UP APPT FOR FUTURE REFILLS** 90 capsule 3   No facility-administered medications prior to visit.    Allergies  Allergen Reactions   Atorvastatin Other (See Comments)    Muscle aches    Crestor [Rosuvastatin] Other (See Comments)    Muscle aches    Zetia [Ezetimibe] Other (See Comments)    Muscle aches     ROS Review of Systems  Constitutional:  Negative for activity change, chills, fatigue and fever.  HENT:  Negative for congestion, postnasal drip, rhinorrhea, sinus pressure, sinus pain, sneezing and sore throat.   Eyes: Negative.   Respiratory:  Negative for cough, shortness of breath and wheezing.   Cardiovascular:  Negative for chest pain and palpitations.  Gastrointestinal:  Negative for constipation, diarrhea, nausea and vomiting.  Endocrine: Negative for cold intolerance, heat intolerance, polydipsia and polyuria.   Genitourinary:  Negative for dysuria, frequency and urgency.  Musculoskeletal:  Negative for back pain and myalgias.  Skin:  Negative for rash.  Allergic/Immunologic: Negative for environmental allergies.  Neurological:  Negative for dizziness, weakness and headaches.  Psychiatric/Behavioral:  The patient is not nervous/anxious.      Objective:    Physical Exam Vitals and nursing note reviewed.  Constitutional:      Appearance: Normal appearance. He is well-developed.  HENT:     Head: Normocephalic and atraumatic.     Nose: Nose normal.     Mouth/Throat:     Mouth: Mucous membranes are moist.  Eyes:     Extraocular Movements: Extraocular movements intact.     Conjunctiva/sclera: Conjunctivae normal.     Pupils: Pupils  are equal, round, and reactive to light.  Neck:     Vascular: No carotid bruit.  Cardiovascular:     Rate and Rhythm: Normal rate and regular rhythm.     Pulses: Normal pulses.     Heart sounds: Normal heart sounds.  Pulmonary:     Effort: Pulmonary effort is normal.     Breath sounds: Normal breath sounds.  Abdominal:     Palpations: Abdomen is soft.     Tenderness: There is no abdominal tenderness.  Musculoskeletal:        General: Normal range of motion.     Cervical back: Normal range of motion and neck supple.  Lymphadenopathy:     Cervical: No cervical adenopathy.  Skin:    General: Skin is warm and dry.     Capillary Refill: Capillary refill takes less than 2 seconds.  Neurological:     General: No focal deficit present.     Mental Status: He is alert and oriented to person, place, and time.  Psychiatric:        Mood and Affect: Mood normal.        Behavior: Behavior normal.        Thought Content: Thought content normal.        Judgment: Judgment normal.    Today's Vitals   08/02/21 0830  BP: 99/65  Pulse: 67  Temp: (!) 97.2 F (36.2 C)  SpO2: 97%  Weight: 241 lb 8 oz (109.5 kg)  Height: 5' 9"  (1.753 m)   Body mass index is 35.66  kg/m.   Wt Readings from Last 3 Encounters:  08/02/21 241 lb 8 oz (109.5 kg)  06/23/21 237 lb 14.4 oz (107.9 kg)  04/29/21 239 lb 9.6 oz (108.7 kg)     Health Maintenance Due  Topic Date Due   FOOT EXAM  Never done   TETANUS/TDAP  Never done   Zoster Vaccines- Shingrix (1 of 2) Never done   PNA vac Low Risk Adult (1 of 2 - PCV13) Never done   COVID-19 Vaccine (3 - Pfizer risk series) 01/30/2020   INFLUENZA VACCINE  06/27/2021    There are no preventive care reminders to display for this patient.  Lab Results  Component Value Date   TSH 3.11 01/03/2021   Lab Results  Component Value Date   WBC 5.9 06/23/2021   HGB 13.8 06/23/2021   HCT 41.1 06/23/2021   MCV 96 06/23/2021   PLT 298 06/23/2021   Lab Results  Component Value Date   NA 146 (H) 06/23/2021   K CANCELED 06/23/2021   CO2 18 (L) 06/23/2021   GLUCOSE CANCELED 06/23/2021   BUN 23 06/23/2021   CREATININE 2.17 (H) 06/23/2021   BILITOT 0.4 10/01/2019   ALKPHOS 70 01/03/2021   AST 18 01/03/2021   ALT 23 01/03/2021   PROT 6.3 10/01/2019   ALBUMIN 4.3 10/01/2019   CALCIUM 10.2 06/23/2021   EGFR 30 (L) 06/23/2021   GFR 56.98 (L) 08/01/2012   Lab Results  Component Value Date   CHOL 119 01/03/2021   Lab Results  Component Value Date   HDL 31 (A) 01/03/2021   Lab Results  Component Value Date   LDLCALC 60 01/03/2021   Lab Results  Component Value Date   TRIG 140 01/03/2021   Lab Results  Component Value Date   CHOLHDL 3.3 12/16/2020   Lab Results  Component Value Date   HGBA1C 6.6 (A) 08/02/2021      Assessment & Plan:  1. Controlled type 2 diabetes mellitus without complication, without long-term current use of insulin (HCC) Blood sugars with good control.  Hemoglobin A1c 6.3 today.  Continue metformin as prescribed.  We will recheck hemoglobin A1c for surveillance at next visit in 4 months. - POCT glycosylated hemoglobin (Hb A1C)  2. Essential hypertension Blood pressure stable.   Continue medication as prescribed.  3. Hyperlipidemia associated with type 2 diabetes mellitus (HCC) Stable lipid panel.  Continue atorvastatin as prescribed.  4. Body mass index (BMI) of 35.0-35.9 in adult Recommend patient limit calorie intake to 2000 cal/day.  He should consume a low-fat, low-cholesterol diet and incorporate exercise into his daily routine.  Problem List Items Addressed This Visit       Cardiovascular and Mediastinum   Essential hypertension     Endocrine   Hyperlipidemia associated with type 2 diabetes mellitus ([place])   Controlled type 2 diabetes mellitus without complication, without long-term current use of insulin ([place]) - Primary   Relevant Orders   POCT glycosylated hemoglobin (Hb A1C) (Completed)     Other   Body mass index (BMI) of 35.0-35.9 in adult   This note was dictated using Systems analyst. Rapid proofreading was performed to expedite the delivery of the information. Despite proofreading, phonetic errors will occur which are common with this voice recognition software. Please take this into consideration. If there are any concerns, please contact our office.     Follow-up: Return in about 4 months (around 12/02/2021) for diabetes with HgbA1c check - needs foot exam. .    Ronnell Freshwater, NP

## 2021-08-02 NOTE — Patient Instructions (Signed)

## 2021-08-24 DIAGNOSIS — Z23 Encounter for immunization: Secondary | ICD-10-CM | POA: Diagnosis not present

## 2021-08-29 DIAGNOSIS — Z23 Encounter for immunization: Secondary | ICD-10-CM | POA: Diagnosis not present

## 2021-09-07 DIAGNOSIS — C4401 Basal cell carcinoma of skin of lip: Secondary | ICD-10-CM | POA: Diagnosis not present

## 2021-09-07 DIAGNOSIS — Z85828 Personal history of other malignant neoplasm of skin: Secondary | ICD-10-CM | POA: Diagnosis not present

## 2021-09-07 DIAGNOSIS — C44319 Basal cell carcinoma of skin of other parts of face: Secondary | ICD-10-CM | POA: Diagnosis not present

## 2021-09-20 ENCOUNTER — Other Ambulatory Visit: Payer: Self-pay

## 2021-09-20 ENCOUNTER — Encounter: Payer: Self-pay | Admitting: Cardiovascular Disease

## 2021-09-20 ENCOUNTER — Ambulatory Visit (INDEPENDENT_AMBULATORY_CARE_PROVIDER_SITE_OTHER): Payer: Medicare Other | Admitting: Cardiovascular Disease

## 2021-09-20 VITALS — BP 108/64 | HR 61 | Resp 96 | Ht 69.0 in | Wt 241.4 lb

## 2021-09-20 DIAGNOSIS — E782 Mixed hyperlipidemia: Secondary | ICD-10-CM

## 2021-09-20 DIAGNOSIS — I2510 Atherosclerotic heart disease of native coronary artery without angina pectoris: Secondary | ICD-10-CM | POA: Diagnosis not present

## 2021-09-20 DIAGNOSIS — I10 Essential (primary) hypertension: Secondary | ICD-10-CM | POA: Diagnosis not present

## 2021-09-20 LAB — BASIC METABOLIC PANEL
BUN/Creatinine Ratio: 13 (ref 10–24)
BUN: 26 mg/dL (ref 8–27)
CO2: 21 mmol/L (ref 20–29)
Calcium: 10 mg/dL (ref 8.6–10.2)
Chloride: 105 mmol/L (ref 96–106)
Creatinine, Ser: 1.94 mg/dL — ABNORMAL HIGH (ref 0.76–1.27)
Glucose: 123 mg/dL — ABNORMAL HIGH (ref 70–99)
Potassium: 3.9 mmol/L (ref 3.5–5.2)
Sodium: 141 mmol/L (ref 134–144)
eGFR: 35 mL/min/{1.73_m2} — ABNORMAL LOW (ref >59)

## 2021-09-20 LAB — LIPID PANEL
Chol/HDL Ratio: 4.5 ratio (ref 0.0–5.0)
Cholesterol, Total: 166 mg/dL (ref 100–199)
HDL: 37 mg/dL — ABNORMAL LOW (ref >39)
LDL Chol Calc (NIH): 105 mg/dL — ABNORMAL HIGH (ref 0–99)
Triglycerides: 134 mg/dL (ref 0–149)
VLDL Cholesterol Cal: 24 mg/dL (ref 5–40)

## 2021-09-20 LAB — ALT: ALT: 18 IU/L (ref 0–44)

## 2021-09-20 NOTE — Patient Instructions (Signed)
Medication Instructions:  Your physician has recommended you make the following change in your medication:  STOP: HCTZ STOP: Potassium (K-Dur)  *If you need a refill on your cardiac medications before your next appointment, please call your pharmacy*   Lab Work: TODAY: ALT, BMP, Lipid If you have labs (blood work) drawn today and your tests are completely normal, you will receive your results only by: [place] (if you have MyChart) OR A paper copy in the mail If you have any lab test that is abnormal or we need to change your treatment, we will call you to review the results.   Testing/Procedures: Your physician has referred you to see Hypertension Clinic in 3 weeks.    Follow-Up: At [facility], you and your health needs are our priority.  As part of our continuing mission to provide you with exceptional heart care, we have created designated Provider Care Teams.  These Care Teams include your primary Cardiologist (physician) and Advanced Practice Providers (APPs -  Physician Assistants and Nurse Practitioners) who all work together to provide you with the care you need, when you need it.   Your next appointment:   1 year(s)  The format for your next appointment:   In Person  Provider:   You may see Mertie Moores, MD or one of the following Advanced Practice Providers on your designated Care Team:   Richardson Dopp, PA-C [place], [place]

## 2021-09-20 NOTE — Progress Notes (Signed)
James Barnes Date of Birth  [birth year]          Problem list: 1. Coronary artery disease-status post CABG in 2008 2. Dyslipidemia 3. Hypertension 4. Syncope  Previous notes.   James Barnes is a 80 y.o. gentleman with the above-noted medical history.  His episodes of presyncope he has gotten out of he has stopped his Altace.  His muscle aches resolved when he stopped the Lipitor.   He has remained active. 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  Oct. 29, 2015:  James Barnes is doing well.  Still working in his wood shop.  Just finished a table and is working on a bed for his grandson.  He stopped his statin - muscle aches. He's tried Lipitor, Crestor, Zetia.  Was not able to tolerate them.    Oct. 28, 2016:   Doing well.   No CP .  No regular exercise.  Working out in his [employer]. intolerent to statins. Has tried Zetia - did not tolerate if for some reason - he cant recall Injectable cholesterol meds were too expensive.   Oct. 31, 2017:  Doing well. No CP or dyspnea. Working in his [place] smokes his pipe,  Advised cessation.   December 10, 2017:    Does not check his BP  Does not limit his salt intake  No CP or dyspnea. Still smokes a pipe.  Does not exercise.   Looking into getting a 3 wheeled bike.  Has hip issues so does not walk  - still eats some salty foods -  Has been eating country ham recently .  Also has gained some weight   January 13, 2019:   James Barnes is seen today for follow-up visit.  He has a history of hypertension and coronary artery bypass grafting.   No CP , no dyspnea. Is generally fatigued.  Pruned some trees several weeks ago .   Had some DOE walking back up the hill He is unable to exercise because of bilateral hip pain. No angina  Has had diarrhea for the past week or so .   October 01, 2019:  James Barnes is seen today for follow-up of  his coronary artery disease, hyperlipidemia.  He is had coronary artery bypass grafting.  In the past he has not really paid attention to his diet.  He eats anything that he wants.  Several weeks ago.  He had some double vision while walking out of a restaurant.  Lasted 1 -2 minutes.  Went to Dr. Osborne Casco. MRI of the brain revealed a 2 cm chronic CVA .  Has an appt with neuro .   Has had some chest pain last ,  Tightness across his chest after lifting some furniture.  Did not feel like his previous episodes of angina  Lasted for few seconsd.   ,  Worse with bending and twisting his torso.   Also had an episode of lightheadedness while driving . Lasted 30 seconds.   Nov. 5, 2021:  James Barnes is seen today for follow up of his CAD, BP is low this am.   Has had some dizziness recently - especialy with standing  Smokes a pipe Advised to stop smoking   September 20, 2021: James Barnes is seen today for follow-up of his coronary artery disease, hypertension, hyperlipidemia.  Blood pressures on the  low side today. Wt is 241 lbs.  Bp is on the low side . Stays hydrated.  Eats an unrestricted diet .    Current Outpatient Medications on File Prior to Visit  Medication Sig Dispense Refill   aspirin EC 81 MG tablet Take 1 tablet (81 mg total) by mouth daily.     atorvastatin (LIPITOR) 40 MG tablet Take 40 mg by mouth daily.     finasteride (PROSCAR) 5 MG tablet Take 1 tablet (5 mg total) by mouth at bedtime. 90 tablet 1   hydrochlorothiazide (HYDRODIURIL) 25 MG tablet Take 1 tablet (25 mg total) by mouth daily. 90 tablet 3   losartan (COZAAR) 50 MG tablet TAKE 1 TABLET BY MOUTH DAILY. 90 tablet 2   metFORMIN (GLUCOPHAGE) 500 MG tablet Take 0.5 tablets (250 mg total) by mouth 2 (two) times daily with a meal. 90 tablet 0   potassium chloride (KLOR-CON) 10 MEQ tablet TAKE 1 TABLET BY MOUTH DAILY. 90 tablet 2   sildenafil (VIAGRA) 100 MG tablet Take 1 tablet (100 mg total) by mouth daily as needed for  erectile dysfunction. 30 tablet 2   tamsulosin (FLOMAX) 0.4 MG CAPS capsule Take 1 capsule (0.4 mg total) by mouth every evening. **SCHEDULE A FOLLOW-UP APPT FOR FUTURE REFILLS** 90 capsule 3   No current facility-administered medications on file prior to visit.    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    CAD (coronary artery disease)    status post CABG in 2008   Colon polyps    Double vision    Dyslipidemia    Hiatal hernia    HTN (hypertension)    Lightheadedness    Obesity    Sinus bradycardia    Hx of   Syncope    with negative echo/Myoview Feb 2013    Past Surgical History:  Procedure Laterality Date   BACK SURGERY  2015   lower   CARDIOVASCULAR STRESS TEST  2013   Negative   COLONOSCOPY WITH PROPOFOL N/A 08/16/2018   Procedure: COLONOSCOPY WITH PROPOFOL;  Surgeon: Laurence Spates, MD;  [facility];  Service: Endoscopy;  Laterality: N/A;   COLONOSCOPY WITH PROPOFOL N/A 09/18/2019   Procedure: COLONOSCOPY WITH PROPOFOL;  Surgeon: Laurence Spates, MD;  [facility];  Service: Endoscopy;  Laterality: N/A;   CORONARY ARTERY BYPASS GRAFT  2008 x 3   with a left internal mammary artery graft to his left anterior descending, right internal mammary  artery graft to his right coronary artery, and  saphenous vein graft to his obtuse marginal.    POLYPECTOMY  08/16/2018   Procedure: POLYPECTOMY;  Surgeon: Laurence Spates, MD;  [facility];  Service: Endoscopy;;   POLYPECTOMY  09/18/2019   Procedure: POLYPECTOMY;  Surgeon: Laurence Spates, MD;  [facility];  Service: Endoscopy;;   US ECHOCARDIOGRAPHY  07-01-2010   EF 55-60%   US ECHOCARDIOGRAPHY  2013   Normal EF;  Grade 2 diastolic dysfunction    Social History   Tobacco Use  Smoking Status Every Day   Types: Pipe  Smokeless Tobacco Never    Social History  Substance and Sexual Activity  Alcohol Use No    Family History  Problem Relation Age of Onset   Heart attack Mother     Reviw of Systems:  Noted in current history.  Otherwise his systems are negative.   Physical Exam: Blood pressure 108/64, pulse 61, resp. rate (!) 96, height 5\' 9"  (1.753 m), weight 241 lb 6.4 oz (109.5 kg).  GEN:  Well nourished, well developed in no acute distress HEENT: Normal NECK: No JVD; No carotid bruits LYMPHATICS: No lymphadenopathy CARDIAC: RRR   RESPIRATORY:  Clear to auscultation without rales, wheezing or rhonchi  ABDOMEN: Soft, non-tender, non-distended MUSCULOSKELETAL:  No edema; No deformity  SKIN: Warm and dry NEUROLOGIC:  Alert and oriented x 3   ECG: September 20, 2021: Normal sinus rhythm at 69.  Right bundle branch block.  Assessment / Plan:   1. Coronary artery disease- .  No angina   2. Dyslipidemia-     3. Hypertension -    BP has been on the low side. Will DC HCTZ and Kdur .   This may help his creatinine.  Follow up in [facility] cl[facility] in 3-4 weeks ( he does not measure hsi BP at home0      Mertie Moores, MD  09/20/2021 10:51 AM    [facility] [place],  [place], [place]  [id number] Pager [phone] Phone: [phone]; Fax: [phone]

## 2021-09-22 ENCOUNTER — Telehealth: Payer: Self-pay | Admitting: Cardiovascular Disease

## 2021-09-22 DIAGNOSIS — E782 Mixed hyperlipidemia: Secondary | ICD-10-CM

## 2021-09-22 MED ORDER — ROSUVASTATIN CALCIUM 20 MG PO TABS: 20.0000 mg | ORAL_TABLET | Freq: Every day | ORAL | 11 refills | Status: DC

## 2021-09-22 NOTE — Telephone Encounter (Signed)
Patient is returning call to discuss lab results. 

## 2021-09-22 NOTE — Telephone Encounter (Signed)
Thayer Headings, MD  09/20/2021  5:41 PM EDT     Basic metabolic profile is basically stable. His LDL is 105.  He was previously better.  If he stopped taking his atorvastatin, please encourage him to take the atorvastatin. If he is taking the atorvastatin, it is not strong enough.  Please discontinue atorvastatin and start rosuvastatin 20 mg a day.  Recheck lipids, liver enzymes, ALT in 3 months.   Called patient with results as written above. Patient will switch from atorvastatin to Rosuvastatin 20 mg. Will recheck lab work in 3 months.   Emmaline Life, NP  09/21/2021  4:50 PM EDT Back to Top    Per Fuller [place], [place] patient was seen in lipid clinic previously and PCSK9i was cost prohibitive, previously intolerant to rosuvastatin 20mg  daily and ezetimibe 10mg  daily. If he's been taking his atorvastatin, are you able to see if he's interested in [place]? It should be covered for free since he has a Medicare supplement. If he's interested I can give him a call to discuss further or pt can discuss with pharmacist when he comes to [facility] on 11/18   Asked patient about his intolerance to rosuvastatin and atorvastatin, and his visit with the lipid clinic. Patient stated he did not go to a lipid clinic and he has not had any trouble taking his cholesterol medications. Patient seemed confused when asking about message above, so will continue with Dr. Elmarie Shiley recommendations for now. Patient does have [facility] on 11/18, will see about them addressing his cholesterol at that time.

## 2021-10-01 ENCOUNTER — Other Ambulatory Visit: Payer: Self-pay | Admitting: Cardiovascular Disease

## 2021-10-11 ENCOUNTER — Ambulatory Visit: Payer: Medicare Other

## 2021-10-14 ENCOUNTER — Ambulatory Visit (INDEPENDENT_AMBULATORY_CARE_PROVIDER_SITE_OTHER): Payer: Medicare Other | Admitting: Pharmacist

## 2021-10-14 VITALS — BP 148/80 | HR 52

## 2021-10-14 DIAGNOSIS — E782 Mixed hyperlipidemia: Secondary | ICD-10-CM

## 2021-10-14 DIAGNOSIS — I10 Essential (primary) hypertension: Secondary | ICD-10-CM

## 2021-10-14 DIAGNOSIS — I2510 Atherosclerotic heart disease of native coronary artery without angina pectoris: Secondary | ICD-10-CM | POA: Diagnosis not present

## 2021-10-14 MED ORDER — LOSARTAN POTASSIUM 100 MG PO TABS: 100.0000 mg | ORAL_TABLET | Freq: Every day | ORAL | 3 refills | Status: DC

## 2021-10-14 NOTE — Patient Instructions (Addendum)
It was nice to see you today  Blood pressure -Your blood pressure goal is < 130/43mmHg -INCREASE your losartan from 50mg  to 100mg  daily. You can double up and take 2 of your 50mg  tablets each day until you run out, then pick up your new prescription for the higher dose of 100mg  and go back to taking 1 tablet daily -Look for unsalted peanuts instead, and change your soda to the version that is diet and caffeine-free  Cholesterol -Your LDL cholesterol goal is < 55 -Continue taking rosuvastatin 20mg  daily  Recheck fasting lab work and blood pressure on Friday, December 9th at 9:30am

## 2021-10-14 NOTE — Progress Notes (Signed)
Patient ID: James Barnes                 DOB: 1940/12/10                      MRN: [id number]     HPI: James Barnes is a 80 y.o. male referred by Dr. Acie Barnes to HTN clinic. PMH is significant for CAD s/p CABG in 2008, CKD, HLD, HTN, and obesity. Last seen by Dr James Barnes 10/21/21 where BP was on the low side at 108/64. His HCTZ and KDur were discontinued and he presents today for follow up.  Pt reports tolerating medications well. Does not check his blood pressure at home. Reports it was checked before a colonoscopy and thinks it was high then but also recalls a systolic reading of 287. Doesn't watch his diet too closely. Denies NSAID use.  Reports tolerating rosuvastatin well. Allergy list includes rosuvastatin, atorvastatin, and ezetimibe with myalgias on all. Pt does not recall this side effect and is not having trouble tolerating his rosuvastatin which he has been on for about 3 weeks now.  Current HTN meds: losartan 50mg  daily Previously tried: HCTZ - stopped when BP was low, SCr also 2 BP goal: <130/75mmHg  Family History: Mother with history of heart attack.  Social History: Smokes a pipe  Diet: salted peanuts, likes meat/steak. Drinks regular soda and coffee.  Exercise: Limited due to bilateral hip pain  Labs: 09/20/21: TC 166, TG 134, HDL 37, LDL 105 (no LLT)       SCr 1.94, Na 141, K 3.9  Wt Readings from Last 3 Encounters:  09/20/21 241 lb 6.4 oz (109.5 kg)  08/02/21 241 lb 8 oz (109.5 kg)  06/23/21 237 lb 14.4 oz (107.9 kg)   BP Readings from Last 3 Encounters:  09/20/21 108/64  08/02/21 99/65  06/23/21 106/67   Pulse Readings from Last 3 Encounters:  09/20/21 61  08/02/21 67  06/23/21 75    Renal function: CrCl cannot be calculated (Patient's most recent lab result is older than the maximum 21 days allowed.).  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     finasteride (PROSCAR) 5 MG tablet Take 1 tablet (5 mg total) by mouth at bedtime. 90 tablet 1   losartan (COZAAR) 50 MG tablet TAKE 1 TABLET BY MOUTH DAILY. 90 tablet 2   metFORMIN (GLUCOPHAGE) 500 MG tablet Take 0.5 tablets (250 mg total) by mouth 2 (two) times daily with a meal. 90 tablet 0   rosuvastatin (CRESTOR) 20 MG tablet Take 1 tablet (20 mg total) by mouth daily. 30 tablet 11   sildenafil (VIAGRA) 100 MG tablet Take 1 tablet (100 mg total) by mouth daily as needed for erectile dysfunction. 30 tablet 2   tamsulosin (FLOMAX) 0.4 MG  CAPS capsule Take 1 capsule (0.4 mg total) by mouth every evening. **SCHEDULE A FOLLOW-UP APPT FOR FUTURE REFILLS** 90 capsule 3   No current facility-administered medications on file prior to visit.    Allergies  Allergen Reactions   Atorvastatin Other (See Comments)    Muscle aches    Crestor [Rosuvastatin] Other (See Comments)    Muscle aches    Zetia [Ezetimibe] Other (See Comments)    Muscle aches      Assessment/Plan:  1. Hypertension - BP has increased notably since stopping HCTZ and is now elevated above goal < 130/71mmHg. Will increase losartan to 100mg  daily. Encouraged pt to look for salt-free snacks and change soda to diet/caffeine-free option. Will recheck BP and labs in 3 weeks.  2. Hyperlipidemia - LDL 105 at baseline above goal < 55 due to CAD and CKD. He is tolerating rosuvastatin 20mg  daily. Will move up labs and recheck in 3 weeks at next visit. Anticipate that high intensity statin will bring LDL to goal.  Pt thinks he is taking more medication  than what we have currently listed. Advised him to bring in all medications to next visit for review.  James Barnes E. Alyn Riedinger, PharmD, BCACP, [place] [address] [place], [place] [address] Phone: [phone]; Fax: [phone] 10/14/2021 9:41 AM

## 2021-11-02 DIAGNOSIS — Z85828 Personal history of other malignant neoplasm of skin: Secondary | ICD-10-CM | POA: Diagnosis not present

## 2021-11-02 DIAGNOSIS — C4401 Basal cell carcinoma of skin of lip: Secondary | ICD-10-CM | POA: Diagnosis not present

## 2021-11-04 ENCOUNTER — Ambulatory Visit (INDEPENDENT_AMBULATORY_CARE_PROVIDER_SITE_OTHER): Payer: Medicare Other | Admitting: Pharmacist

## 2021-11-04 ENCOUNTER — Other Ambulatory Visit: Payer: Self-pay

## 2021-11-04 ENCOUNTER — Other Ambulatory Visit: Payer: Medicare Other | Admitting: *Deleted

## 2021-11-04 VITALS — BP 162/84 | HR 53

## 2021-11-04 DIAGNOSIS — I10 Essential (primary) hypertension: Secondary | ICD-10-CM | POA: Diagnosis not present

## 2021-11-04 DIAGNOSIS — I2510 Atherosclerotic heart disease of native coronary artery without angina pectoris: Secondary | ICD-10-CM | POA: Diagnosis not present

## 2021-11-04 DIAGNOSIS — E782 Mixed hyperlipidemia: Secondary | ICD-10-CM | POA: Diagnosis not present

## 2021-11-04 LAB — COMPREHENSIVE METABOLIC PANEL
ALT: 22 IU/L (ref 0–44)
AST: 27 IU/L (ref 0–40)
Albumin/Globulin Ratio: 2 (ref 1.2–2.2)
Albumin: 4.3 g/dL (ref 3.7–4.7)
Alkaline Phosphatase: 69 IU/L (ref 44–121)
BUN/Creatinine Ratio: 11 (ref 10–24)
BUN: 20 mg/dL (ref 8–27)
Bilirubin Total: 0.3 mg/dL (ref 0.0–1.2)
CO2: 25 mmol/L (ref 20–29)
Calcium: 9.6 mg/dL (ref 8.6–10.2)
Chloride: 108 mmol/L — ABNORMAL HIGH (ref 96–106)
Creatinine, Ser: 1.85 mg/dL — ABNORMAL HIGH (ref 0.76–1.27)
Globulin, Total: 2.1 g/dL (ref 1.5–4.5)
Glucose: 116 mg/dL — ABNORMAL HIGH (ref 70–99)
Potassium: 4.6 mmol/L (ref 3.5–5.2)
Sodium: 144 mmol/L (ref 134–144)
Total Protein: 6.4 g/dL (ref 6.0–8.5)
eGFR: 37 mL/min/{1.73_m2} — ABNORMAL LOW (ref >59)

## 2021-11-04 LAB — LIPID PANEL
Chol/HDL Ratio: 3.7 ratio (ref 0.0–5.0)
Cholesterol, Total: 134 mg/dL (ref 100–199)
HDL: 36 mg/dL — ABNORMAL LOW (ref >39)
LDL Chol Calc (NIH): 80 mg/dL (ref 0–99)
Triglycerides: 95 mg/dL (ref 0–149)
VLDL Cholesterol Cal: 18 mg/dL (ref 5–40)

## 2021-11-04 MED ORDER — AMLODIPINE BESYLATE 5 MG PO TABS: 5.0000 mg | ORAL_TABLET | Freq: Every day | ORAL | 5 refills | Status: DC

## 2021-11-04 NOTE — Patient Instructions (Addendum)
It was nice to see you today!  Your blood pressure is running above your goal < 130/43mmHg -Start taking amlodipine 5mg  1 tablet once daily -Continue taking your losartan -Try to limit salt intake to < 2,000mg  daily -Monitor your blood pressure at home -I will call you in a few weeks to see how your readings are looking. If they are still above goal, we can increase the dose of the amlodipine medicine  Your LDL cholesterol goal is < 55 -We are checking updated labs since you started rosuvastatin 20mg  daily. Please continue taking this

## 2021-11-04 NOTE — Progress Notes (Signed)
Patient ID: James Barnes                 DOB: 1941/01/27                      MRN: [id number]     HPI: James Barnes is a 80 y.o. male referred by Dr. Acie Fredrickson to HTN clinic. PMH is significant for CAD s/p CABG in 2008, CKD, HLD, HTN, and obesity. Last seen by Dr Acie Fredrickson 10/21/21 where BP was on the low side at 108/64. His HCTZ and KDur were discontinued. Follow up BP increased to 148/80. His losartan was increased to 100mg  daily at 11/18 visit.  Pt reports tolerating medications well. Denies dizziness, headache, LE edema. Does not check his blood pressure at home but does have a cuff. Has tried to cut back a bit on salt. Drinks 2 servings of caffeine a day (coffee in AM and soda in PM). Denies NSAID use.  Reports tolerating rosuvastatin well. Allergy list includes rosuvastatin, atorvastatin, and ezetimibe with myalgias on all. Pt does not recall this side effect and is not having trouble tolerating his rosuvastatin. Rechecking updated labs today.  Current HTN meds: losartan 100mg  daily Previously tried: HCTZ - stopped when BP was low, SCr also 2 BP goal: <130/34mmHg  Family History: Mother with history of heart attack.  Social History: Smokes a pipe  Diet: salted peanuts, likes meat/steak. Drinks regular soda and coffee.  Exercise: Limited due to bilateral hip pain  Labs: 09/20/21: TC 166, TG 134, HDL 37, LDL 105 (no LLT)       SCr 1.94, Na 141, K 3.9  Wt Readings from Last 3 Encounters:  09/20/21 241 lb 6.4 oz (109.5 kg)  08/02/21 241 lb 8 oz (109.5 kg)  06/23/21 237 lb 14.4 oz (107.9 kg)   BP Readings from Last 3 Encounters:  10/14/21 (!) 148/80  09/20/21 108/64  08/02/21 99/65   Pulse Readings from Last 3 Encounters:  10/14/21 (!) 52  09/20/21 61  08/02/21 67    Renal function: CrCl cannot be calculated (Patient's most recent lab result is older than the maximum 21 days allowed.).  Past Medical History:  Diagnosis Date   Back pain    Back pain    CA in situ skin  2017   squamous    CAD (coronary artery disease)    status post CABG in 2008   Colon polyps    Double vision    Dyslipidemia    Hiatal hernia    HTN (hypertension)    Lightheadedness    Obesity    Sinus bradycardia    Hx of   Syncope    with negative echo/Myoview Feb 2013    Current Outpatient Medications on File Prior to Visit  Medication Sig Dispense Refill   aspirin EC 81 MG tablet Take 1 tablet (81 mg total) by mouth daily.     finasteride (PROSCAR) 5 MG tablet Take 1 tablet (5 mg total) by mouth at bedtime. 90 tablet 1   losartan (COZAAR) 100 MG tablet Take 1 tablet (100 mg total) by mouth daily. 90 tablet 3   metFORMIN (GLUCOPHAGE) 500 MG tablet Take 0.5 tablets (250 mg total) by mouth 2 (two) times daily with a meal. 90 tablet 0   rosuvastatin (CRESTOR) 20 MG tablet Take 1 tablet (20 mg total) by mouth daily. 30 tablet 11   sildenafil (VIAGRA) 100 MG tablet Take 1 tablet (100 mg total) by mouth daily  as needed for erectile dysfunction. 30 tablet 2   tamsulosin (FLOMAX) 0.4 MG CAPS capsule Take 1 capsule (0.4 mg total) by mouth every evening. **SCHEDULE A FOLLOW-UP APPT FOR FUTURE REFILLS** 90 capsule 3   No current facility-administered medications on file prior to visit.    Allergies  Allergen Reactions   Atorvastatin Other (See Comments)    Muscle aches    Crestor [Rosuvastatin] Other (See Comments)    Muscle aches    Zetia [Ezetimibe] Other (See Comments)    Muscle aches      Assessment/Plan:  1. Hypertension - BP remains elevated above goal < 130/4mmHg and is now actually higher than his last visit despite dose increase of losartan. Checking BMET today with recent dose change of losartan. Will start amlodipine 5mg  daily and continue losartan 100mg  daily. Pt encouraged to limit daily sodium intake to < 2,000mg  daily and to start monitoring/recording his BP at home. I'll call him in 2 weeks to follow up with home readings and can titrate amlodipine dose further  if needed.  2. Hyperlipidemia - LDL 105 at baseline above goal < 55 due to CAD and CKD. He is tolerating rosuvastatin 20mg  daily. Checking lipids today. Anticipate that high intensity statin will bring LDL to goal.   Wissam Resor E. Jhase Creppel, PharmD, BCACP, [place] [address], [place], [place] [address] Phone: [phone]; Fax: [phone] 11/04/2021 7:32 AM

## 2021-11-07 ENCOUNTER — Telehealth: Payer: Self-pay | Admitting: Pharmacist

## 2021-11-07 MED ORDER — ROSUVASTATIN CALCIUM 40 MG PO TABS: 40.0000 mg | ORAL_TABLET | Freq: Every day | ORAL | 3 refills | Status: DC

## 2021-11-07 NOTE — Telephone Encounter (Signed)
Called pt to discuss lab work. BMET stable on higher dose of losartan. Lipid panel shows LDL still elevated above goal. His LDL dropped only about 24% after starting rosuvastatin 20mg  daily, typically see > 50% LDL drop. Pt reports adherence with his rosuvastatin and denies missed doses. Will increase rosuvastatin to 40mg  daily. He does have CKD but CrCl remains > 29mL/min using actual and adjusted body weight so ok for dose increase. He is previously intolerant to atorvastatin.  I'll follow up with pt's home BP readings in a few weeks as detailed in 12/9 office visit.

## 2021-11-18 ENCOUNTER — Telehealth: Payer: Self-pay | Admitting: Pharmacist

## 2021-11-18 NOTE — Telephone Encounter (Signed)
Called pt to follow up with home BP readings since starting amlodipine. Pt states he hasn't checked it at all. He is tolerating dose increase of rosuvastatin well though. Advised pt to start checking BP a few times a week and I'll call him in early to mid January to follow up with readings. Can increase dose of amlodipine at that time if needed.

## 2021-11-22 ENCOUNTER — Other Ambulatory Visit: Payer: Self-pay | Admitting: Nurse Practitioner

## 2021-11-22 DIAGNOSIS — N400 Enlarged prostate without lower urinary tract symptoms: Secondary | ICD-10-CM

## 2021-12-05 ENCOUNTER — Other Ambulatory Visit: Payer: Self-pay | Admitting: Nurse Practitioner

## 2021-12-05 DIAGNOSIS — E119 Type 2 diabetes mellitus without complications: Secondary | ICD-10-CM

## 2021-12-06 ENCOUNTER — Telehealth: Payer: Self-pay | Admitting: Pharmacist

## 2021-12-06 MED ORDER — AMLODIPINE BESYLATE 10 MG PO TABS: 10.0000 mg | ORAL_TABLET | Freq: Every day | ORAL | 3 refills | Status: DC

## 2021-12-06 NOTE — Telephone Encounter (Signed)
Pt called clinic, reports he's checked his BP twice and it's been elevated at 176/84 and 176/72. Will increase amlodipine from 5mg  to 10mg  dily and continue losartan 100mg  daily. Advised pt to continue to monitor BP. I'll call him in 2 weeks to discuss, can add on chlorthalidone if needed (most recent CrCl 57mL/min using adjusted body weight).

## 2021-12-19 ENCOUNTER — Other Ambulatory Visit: Payer: Self-pay | Admitting: Nurse Practitioner

## 2021-12-20 NOTE — Telephone Encounter (Signed)
Called pt to follow up with home BP readings since increasing amlodipine to 10mg  daily. He reports BP have improved to 134/74, 136/75, has not seen SBP < 130. Discussed adding on thiazide or changing losartan to more effective ARB like irbesartan since BP is much closer to goal to avoid increasing pill burden. He prefers to continue on same medications for now. Advised pt to continue monitoring BP and I'll call him in another 2 weeks and can discuss above med changes again if needed.

## 2021-12-26 ENCOUNTER — Other Ambulatory Visit: Payer: Self-pay | Admitting: Nurse Practitioner

## 2021-12-28 ENCOUNTER — Other Ambulatory Visit: Payer: Medicare Other

## 2022-01-03 NOTE — Telephone Encounter (Signed)
Left message for pt to discuss updated BP readings.

## 2022-01-09 ENCOUNTER — Other Ambulatory Visit: Payer: Self-pay

## 2022-01-09 ENCOUNTER — Encounter: Payer: Self-pay | Admitting: Nurse Practitioner

## 2022-01-09 ENCOUNTER — Ambulatory Visit (INDEPENDENT_AMBULATORY_CARE_PROVIDER_SITE_OTHER): Payer: Medicare Other | Admitting: Nurse Practitioner

## 2022-01-09 VITALS — BP 107/66 | HR 78 | Temp 97.5°F | Ht 69.0 in | Wt 241.1 lb

## 2022-01-09 DIAGNOSIS — I10 Essential (primary) hypertension: Secondary | ICD-10-CM

## 2022-01-09 DIAGNOSIS — R600 Localized edema: Secondary | ICD-10-CM

## 2022-01-09 MED ORDER — HYDROCHLOROTHIAZIDE 12.5 MG PO TABS: ORAL_TABLET | ORAL | 1 refills | Status: DC

## 2022-01-09 NOTE — Progress Notes (Signed)
Established patient visit   Patient: James Barnes   DOB: 1941/10/24   81 y.o. Male  MRN: [id number] Visit Date: 01/09/2022  Chief Complaint  Patient presents with   Leg Swelling   Foot Swelling   Subjective    HPI  The patient states that both lower extremities are swollen and slightly tender. This started last week. States that he woke up one day last week with swelling and pain of both lower extremities. This has been unchanged since then. He denies fever or chills. He denies body aches or pain. Denies chest pain, chest pressure, or shortness of breath. He states that amlodipine dosing was recently increased to 10mg  daily. He states that when that dose was increased, he stopped taking losartan.   Medications: Outpatient Medications Prior to Visit  Medication Sig   amLODipine (NORVASC) 10 MG tablet Take 1 tablet (10 mg total) by mouth daily.   aspirin EC 81 MG tablet Take 1 tablet (81 mg total) by mouth daily.   atorvastatin (LIPITOR) 40 MG tablet Take 1 tablet (40 mg total) by mouth daily. **PLEASE CONTACT OUR OFFICE TO SCHEDULE A FOLLOW UP FOR FUTURE MED REFILLS**   finasteride (PROSCAR) 5 MG tablet TAKE 1 TABLET (5 MG TOTAL) BY MOUTH AT BEDTIME.   metFORMIN (GLUCOPHAGE) 500 MG tablet Take 0.5 tablets (250 mg total) by mouth 2 (two) times daily with a meal. **PLEASE CONTACT OUR OFFICE TO SCHEDULE A FOLLOW UP FOR FUTURE MED REFILLS**   rosuvastatin (CRESTOR) 40 MG tablet Take 1 tablet (40 mg total) by mouth daily.   sildenafil (VIAGRA) 100 MG tablet Take 1 tablet (100 mg total) by mouth daily as needed for erectile dysfunction.   tamsulosin (FLOMAX) 0.4 MG CAPS capsule Take 1 capsule (0.4 mg total) by mouth every evening. **SCHEDULE A FOLLOW-UP APPT FOR FUTURE REFILLS**   losartan (COZAAR) 100 MG tablet Take 1 tablet (100 mg total) by mouth daily. (Patient not taking: Reported on 01/09/2022)   No facility-administered medications prior to visit.    Review of Systems  Constitutional:   Positive for fatigue. Negative for activity change, chills and fever.  HENT:  Negative for congestion, postnasal drip, rhinorrhea, sinus pressure, sinus pain, sneezing and sore throat.   Eyes: Negative.   Respiratory:  Negative for cough, shortness of breath and wheezing.   Cardiovascular:  Positive for leg swelling. Negative for chest pain and palpitations.  Gastrointestinal:  Negative for constipation, diarrhea, nausea and vomiting.  Endocrine: Negative for cold intolerance, heat intolerance, polydipsia and polyuria.  Genitourinary:  Negative for dysuria, frequency and urgency.  Musculoskeletal:  Negative for back pain and myalgias.  Skin:  Negative for rash.  Allergic/Immunologic: Negative for environmental allergies.  Neurological:  Negative for dizziness, weakness and headaches.  Psychiatric/Behavioral:  The patient is not nervous/anxious.     Objective     Today's Vitals   01/09/22 1119 01/09/22 1143  BP: (!) 148/74 107/66  Pulse: 78   Temp: (!) 97.5 F (36.4 C)   SpO2: 98%   Weight: 241 lb 1.9 oz (109.4 kg)   Height: 5\' 9"  (1.753 m)    Body mass index is 35.61 kg/m.   Physical Exam Vitals and nursing note reviewed.  Constitutional:      Appearance: Normal appearance. He is well-developed. He is obese.  HENT:     Head: Normocephalic and atraumatic.  Eyes:     Pupils: Pupils are equal, round, and reactive to light.  Cardiovascular:     Rate and  Rhythm: Normal rate. Rhythm irregular.     Pulses: Normal pulses.     Heart sounds: Normal heart sounds.  Pulmonary:     Effort: Pulmonary effort is normal.     Breath sounds: Normal breath sounds.  Abdominal:     Palpations: Abdomen is soft.  Musculoskeletal:        General: Normal range of motion.     Cervical back: Normal range of motion and neck supple.     Right lower leg: Edema present.     Left lower leg: Edema present.     Comments: There is !+ pitting edema present in bilateral lower extremities. He does have  some tenderness with palpation of the medial aspect of the right lower extremity. No redness or warmth is appreciated today. He ha good capillary refill. Pedal pulses are palpable and robust bilaterally.   Lymphadenopathy:     Cervical: No cervical adenopathy.  Skin:    General: Skin is warm and dry.     Capillary Refill: Capillary refill takes less than 2 seconds.  Neurological:     General: No focal deficit present.     Mental Status: He is alert and oriented to person, place, and time.  Psychiatric:        Mood and Affect: Mood normal.        Behavior: Behavior normal.        Thought Content: Thought content normal.        Judgment: Judgment normal.      Assessment & Plan     1. Lower extremity edema Will add hydrochlorothiazide 12.5mg  daily for 5  days. After initial five days, may take once a day if needed for swelling in the legs.  - hydrochlorothiazide (HYDRODIURIL) 12.5 MG tablet; Take 1 tablet po QD for 5 days then QD PRN for swelling.  Dispense: 30 tablet; Refill: 1  2. Essential hypertension The following plan was discussed with the patient: Start hydrochlorothiazide 12.5mg  tablets. Take these daily for next five days. After that, you may take them daily if needed.  Restart your losartan Continue amlodipine  Swelling may be due to amlodipine as this started when dose was increased to 10mg  daily. Please contact your cardiologist to see if they want change anything with your medication.  Make sure you are sitting with your feet elevated, drink plenty of water, and limit your salt intake.    Written instructions were provided to support the discussion in the office. He agrees with the current plan.   Return in about 4 weeks (around 02/06/2022) for diabetes with HgbA1c check.        Ronnell Freshwater, NP  [facility] at [facility] [phone] (phone) [phone] (fax)  [place]

## 2022-01-09 NOTE — Patient Instructions (Signed)
Start hydrochlorothiazide 12.5mg  tablets. Take these daily for next five days. After that, you may take them daily if needed.  Restart your losartan Continue amlodipine  Swelling may be due to amlodipine as this started when dose was increased to 10mg  daily. Please contact your cardiologist to see if they want change anything with your medication.  Make sure you are sitting with your feet elevated, drink plenty of water, and limit your salt intake.

## 2022-01-10 ENCOUNTER — Telehealth: Payer: Self-pay | Admitting: Pharmacist

## 2022-01-10 DIAGNOSIS — R600 Localized edema: Secondary | ICD-10-CM

## 2022-01-10 NOTE — Telephone Encounter (Signed)
Pt called clinic. States his ankles started swelling last week. Saw PCP yesterday who prescribed HCTZ to take for 5 days then prn. I did increase his amlodipine from 5mg  to 10mg  daily on 1/10 which may be contributing. Pt will monitor BP and swelling over the next week since PCP just made med change yesterday. I'll call him in a week to discuss. Can consider decreasing amlodipine dose if needed.

## 2022-01-17 NOTE — Telephone Encounter (Addendum)
Called pt to follow up with home BP readings and swelling in case dose decrease of amlodipine is needed. Left message. PCP office also marked pt as not taking his losartan which he is supposed to be on. His AVS from that day states to restart. Need to clarify if/why he had stopped taking this.

## 2022-01-23 ENCOUNTER — Other Ambulatory Visit: Payer: Self-pay | Admitting: Physician Assistant

## 2022-01-23 MED ORDER — HYDROCHLOROTHIAZIDE 12.5 MG PO TABS: 12.5000 mg | ORAL_TABLET | Freq: Every day | ORAL | 3 refills | Status: DC

## 2022-01-23 MED ORDER — AMLODIPINE BESYLATE 5 MG PO TABS: 5.0000 mg | ORAL_TABLET | Freq: Every day | ORAL | 3 refills | Status: DC

## 2022-01-23 NOTE — Telephone Encounter (Signed)
Called pt again. Reports a little swelling in his ankles still, BP 145/74. Is taking losartan daily. Took a dose of HCTZ  last night for first time in a few days. Will decrease amlodipine back to 5mg  daily and change HCTZ to be a scheduled daily med for BP and LE edema control. Scheduled f/u in 4 weeks for BP check and repeat BMET.

## 2022-01-23 NOTE — Addendum Note (Signed)
Addended by: Maresha Anastos E on: 01/23/2022 10:22 AM   Modules accepted: Orders

## 2022-02-21 NOTE — Progress Notes (Signed)
Patient ID: James Barnes                 DOB: 1941/11/01                      MRN: [id number] ? ? ? ? ?HPI: ?James Barnes is a 81 y.o. male referred by Dr. Acie Fredrickson to HTN clinic. PMH is significant for CAD s/p CABG in 2008, CKD, HLD, HTN, and obesity. Last seen by Dr Acie Fredrickson 10/21/21 where BP was on the low side at 108/64. His HCTZ and KDur were discontinued. Follow up BP increased to 148/80. His losartan was increased to '100mg'$  daily at 11/18 visit. I saw pt on 11/04/22 and start amlodipine '5mg'$  daily as BP remained elevated at 162/84. Dose subsequently increased to '10mg'$  daily as home BP remained in the 366Y systolic. He reported swelling in his ankles, PCP started him on HCTZ for a few days. I decreased his amlodipine back to '5mg'$  daily and changed HCZ to scheduled dosing. I also increased his rosuvastatin from '20mg'$  to '40mg'$  daily as he had a less than anticipated LDL reduction on the '20mg'$  dose. ? ?Pt reports tolerating medications well. Denies dizziness and headache, LE edema has improved. Very mild in right ankle today on exam. Has not brought in BP cuff to office before but recalls home BP readings in the 170s/70s regularly. Has tried to cut back a bit on salt. Drinks 2 servings of caffeine a day (coffee in AM and soda in PM). Denies NSAID use. ? ?Of note, his PCP prescribed atorvastatin when he was already on rosuvastatin. He has been taking both of these. ? ?Current HTN meds: losartan '100mg'$  daily, amlodipine '5mg'$  daily, HCTZ 12.'5mg'$  daily - all taken in PM ?Previously tried: HCTZ - stopped when BP was low, SCr also 2; amlodipine '10mg'$  daily - LE edema ?BP goal: <130/15mHg ? ?Family History: Mother with history of heart attack. ? ?Social History: Smokes a pipe ? ?Diet: salted peanuts, likes meat/steak. Drinks regular soda and coffee. ? ?Exercise: Limited due to bilateral hip pain ? ?Labs: ?09/20/21: TC 166, TG 134, HDL 37, LDL 105 (no LLT) ?      SCr 1.94, Na 141, K 3.9 ? ?Wt Readings from Last 3 Encounters:   ?01/09/22 241 lb 1.9 oz (109.4 kg)  ?09/20/21 241 lb 6.4 oz (109.5 kg)  ?08/02/21 241 lb 8 oz (109.5 kg)  ? ?BP Readings from Last 3 Encounters:  ?01/09/22 107/66  ?11/04/21 (!) 162/84  ?10/14/21 (!) 148/80  ? ?Pulse Readings from Last 3 Encounters:  ?01/09/22 78  ?11/04/21 (!) 53  ?10/14/21 (!) 52  ? ? ?Renal function: ?CrCl cannot be calculated (Patient's most recent lab result is older than the maximum 21 days allowed.). ? ?Past Medical History:  ?Diagnosis Date  ? Back pain   ? Back pain   ? CA in situ skin 2017  ? squamous   ? CAD (coronary artery disease)   ? status post CABG in 2008  ? Colon polyps   ? Double vision   ? Dyslipidemia   ? Hiatal hernia   ? HTN (hypertension)   ? Lightheadedness   ? Obesity   ? Sinus bradycardia   ? Hx of  ? Syncope   ? with negative echo/Myoview Feb 2013  ? ? ?Current Outpatient Medications on File Prior to Visit  ?Medication Sig Dispense Refill  ? amLODipine (NORVASC) 5 MG tablet Take 1 tablet (5 mg total) by mouth  daily. 90 tablet 3  ? aspirin EC 81 MG tablet Take 1 tablet (81 mg total) by mouth daily.    ? atorvastatin (LIPITOR) 40 MG tablet TAKE 1 TABLET (40 MG TOTAL) BY MOUTH DAILY. **PLEASE CONTACT OUR OFFICE TO SCHEDULE A FOLLOW UP FOR FUTURE MED REFILLS** 30 tablet 0  ? finasteride (PROSCAR) 5 MG tablet TAKE 1 TABLET (5 MG TOTAL) BY MOUTH AT BEDTIME. 90 tablet 0  ? hydrochlorothiazide (HYDRODIURIL) 12.5 MG tablet Take 1 tablet (12.5 mg total) by mouth daily. Take 1 tablet po QD for 5 days then QD PRN for swelling. 30 tablet 3  ? losartan (COZAAR) 100 MG tablet Take 1 tablet (100 mg total) by mouth daily. 90 tablet 3  ? metFORMIN (GLUCOPHAGE) 500 MG tablet Take 0.5 tablets (250 mg total) by mouth 2 (two) times daily with a meal. **PLEASE CONTACT OUR OFFICE TO SCHEDULE A FOLLOW UP FOR FUTURE MED REFILLS** 90 tablet 0  ? rosuvastatin (CRESTOR) 40 MG tablet Take 1 tablet (40 mg total) by mouth daily. 90 tablet 3  ? sildenafil (VIAGRA) 100 MG tablet Take 1 tablet (100 mg  total) by mouth daily as needed for erectile dysfunction. 30 tablet 2  ? tamsulosin (FLOMAX) 0.4 MG CAPS capsule Take 1 capsule (0.4 mg total) by mouth every evening. **SCHEDULE A FOLLOW-UP APPT FOR FUTURE REFILLS** 90 capsule 3  ? ?No current facility-administered medications on file prior to visit.  ? ? ?Allergies  ?Allergen Reactions  ? Atorvastatin Other (See Comments)  ?  Muscle aches ?  ? Crestor [Rosuvastatin] Other (See Comments)  ?  Muscle aches ?  ? Zetia [Ezetimibe] Other (See Comments)  ?  Muscle aches ?  ? ? ? ?Assessment/Plan: ? ?1. Hypertension - BP remains elevated above goal < 130/11mHg. Checking BMET today and will call pt once labs result to finalize med plan. Currently taking losartan '100mg'$  daily, amlodipine '5mg'$  daily (LE edema on '10mg'$ ), and HCTZ 12.'5mg'$  daily. Hopefully can increase HCTZ to '25mg'$  daily or amlodipine to 7.'5mg'$  daily, pt does have CKD. Will need f/u scheduled once med plan finalized. Also advised pt to bring in home cuff to next visit once scheduled to verify accuracy. ? ?2. Hyperlipidemia - LDL 80 on rosuvastatin '20mg'$  daily above goal < 55 due to CAD and CKD. I increased his rosuvastatin from '20mg'$  to '40mg'$  in December. Since then, his PCP also prescribed atorvastatin '40mg'$  and he's been taking both statins since then. He will stop his atorvastatin and just continue on rosuvastatin. Checking lipids today, will likely be lower than normal due to duplicate statin therapy. Also adding on direct LDL since pt is not fasting today (had gravy biscuit this AM). ? ? ?James Barnes, PharmD, BCACP, CPP ?[place] [place] [address]?Phone: [phone] Fax: [phone]?02/21/2022 3:54 PM ? ? ?

## 2022-02-22 ENCOUNTER — Ambulatory Visit (INDEPENDENT_AMBULATORY_CARE_PROVIDER_SITE_OTHER): Payer: Medicare Other | Admitting: Pharmacist

## 2022-02-22 ENCOUNTER — Other Ambulatory Visit: Payer: Self-pay

## 2022-02-22 VITALS — BP 148/78 | HR 52

## 2022-02-22 DIAGNOSIS — I10 Essential (primary) hypertension: Secondary | ICD-10-CM

## 2022-02-22 DIAGNOSIS — E782 Mixed hyperlipidemia: Secondary | ICD-10-CM

## 2022-02-22 NOTE — Patient Instructions (Addendum)
Stop taking atorvastatin, continue taking your rosuvastatin ? ?I'll call tomorrow with your lab results and blood pressure medication adjustments ? ? ? ?

## 2022-02-23 ENCOUNTER — Telehealth: Payer: Self-pay | Admitting: Pharmacist

## 2022-02-23 LAB — COMPREHENSIVE METABOLIC PANEL
ALT: 24 IU/L (ref 0–44)
AST: 23 IU/L (ref 0–40)
Albumin/Globulin Ratio: 2 (ref 1.2–2.2)
Albumin: 4.4 g/dL (ref 3.7–4.7)
Alkaline Phosphatase: 65 IU/L (ref 44–121)
BUN/Creatinine Ratio: 10 (ref 10–24)
BUN: 16 mg/dL (ref 8–27)
Bilirubin Total: 0.4 mg/dL (ref 0.0–1.2)
CO2: 26 mmol/L (ref 20–29)
Calcium: 9.9 mg/dL (ref 8.6–10.2)
Chloride: 111 mmol/L — ABNORMAL HIGH (ref 96–106)
Creatinine, Ser: 1.68 mg/dL — ABNORMAL HIGH (ref 0.76–1.27)
Globulin, Total: 2.2 g/dL (ref 1.5–4.5)
Glucose: 126 mg/dL — ABNORMAL HIGH (ref 70–99)
Potassium: 4.7 mmol/L (ref 3.5–5.2)
Sodium: 148 mmol/L — ABNORMAL HIGH (ref 134–144)
Total Protein: 6.6 g/dL (ref 6.0–8.5)
eGFR: 41 mL/min/{1.73_m2} — ABNORMAL LOW (ref >59)

## 2022-02-23 LAB — LIPID PANEL
Chol/HDL Ratio: 2.7 ratio (ref 0.0–5.0)
Cholesterol, Total: 112 mg/dL (ref 100–199)
HDL: 42 mg/dL (ref >39)
LDL Chol Calc (NIH): 54 mg/dL (ref 0–99)
Triglycerides: 82 mg/dL (ref 0–149)
VLDL Cholesterol Cal: 16 mg/dL (ref 5–40)

## 2022-02-23 LAB — LDL CHOLESTEROL, DIRECT: LDL Direct: 53 mg/dL (ref 0–99)

## 2022-02-23 MED ORDER — HYDROCHLOROTHIAZIDE 25 MG PO TABS: 25.0000 mg | ORAL_TABLET | Freq: Every day | ORAL | 5 refills | Status: DC

## 2022-02-23 NOTE — Telephone Encounter (Signed)
Pt returned call, relayed below message. F/u scheduled on April 25 for BP check and BMET. ?

## 2022-02-23 NOTE — Telephone Encounter (Signed)
BMET stable, will increase HCTZ from 12.'5mg'$  to '25mg'$  daily. Lipids also excellent, pt already advised at visit yesterday to stop duplicate statin therapy and just continue on rosuvastatin. Will need 1 month follow up visit for BP check and BMET, left message for pt. ?

## 2022-03-20 NOTE — Progress Notes (Signed)
Patient ID: James Barnes                 DOB: 07-26-1941                      MRN: [id number] ? ? ? ? ?HPI: ?James Barnes is a 81 y.o. male referred by Dr. Acie Fredrickson to HTN clinic. PMH is significant for CAD s/p CABG in 2008, CKD, HLD, HTN, and obesity. Last seen by Dr Acie Fredrickson 10/21/21 where BP was on the low side at 108/64. His HCTZ and KDur were discontinued. Follow up BP increased to 148/80. His losartan was increased to '100mg'$  daily at 11/18 visit. I saw pt on 11/04/22 and started amlodipine '5mg'$  daily as BP remained elevated at 162/84. Dose subsequently increased to '10mg'$  daily as home BP remained in the 403K systolic. He reported swelling in his ankles, PCP started him on short course of HCTZ. At next follow up visit, I decreased his amlodipine back to '5mg'$  daily and changed HCTZ to scheduled dosing with subsequent dose increase. Pt also advised to bring in home cuff to next visit to validate with clinic cuff. ? ?Pt presents today for HTN f/u appointment. Pt reports med compliance. He takes 2 of his BP meds in the morning (amlodipine + another but unsure of the name) and the other at night. Denies dizziness, lightheadedness, blurred vision, HA, and swelling. His swelling did resolve from last visit. Pt initially stated he was taking '5mg'$  of amlodipine as prescribed but then at the end of his visit said he was taking 2 '5mg'$  tablets daily and never decreased his dose. He did start taking HCTZ '25mg'$  daily. He did not bring his cuff or readings today. States last night's reading was 135/84 (PM) and his BP typically runs in the 130s/80s. Only checks BP at night, after he takes all his medications. ? ?Current HTN meds: losartan '100mg'$  daily, amlodipine '10mg'$  daily, HCTZ '25mg'$  daily ?Previously tried: HCTZ - stopped when BP was low, SCr also 2; amlodipine '10mg'$  daily - LE edema ? ?BP goal: <130/80 mmHg ? ?Family History: Mother with history of heart attack ? ?Social History: Smokes a pipe ? ?Diet: salted peanuts, likes meat/steak.  Drinks regular soda and coffee (2 servings) ? ?Exercise: Limited due to bilateral hip pain ? ?Wt Readings from Last 3 Encounters:  ?01/09/22 241 lb 1.9 oz (109.4 kg)  ?09/20/21 241 lb 6.4 oz (109.5 kg)  ?08/02/21 241 lb 8 oz (109.5 kg)  ? ?BP Readings from Last 3 Encounters:  ?02/22/22 (!) 148/78  ?01/09/22 107/66  ?11/04/21 (!) 162/84  ? ?Pulse Readings from Last 3 Encounters:  ?02/22/22 (!) 52  ?01/09/22 78  ?11/04/21 (!) 53  ? ?Renal function: ?CrCl cannot be calculated (Patient's most recent lab result is older than the maximum 21 days allowed.). ? ?Past Medical History:  ?Diagnosis Date  ? Back pain   ? Back pain   ? CA in situ skin 2017  ? squamous   ? CAD (coronary artery disease)   ? status post CABG in 2008  ? Colon polyps   ? Double vision   ? Dyslipidemia   ? Hiatal hernia   ? HTN (hypertension)   ? Lightheadedness   ? Obesity   ? Sinus bradycardia   ? Hx of  ? Syncope   ? with negative echo/Myoview Feb 2013  ? ?Current Outpatient Medications on File Prior to Visit  ?Medication Sig Dispense Refill  ? amLODipine (NORVASC)  5 MG tablet Take 1 tablet (5 mg total) by mouth daily. 90 tablet 3  ? aspirin EC 81 MG tablet Take 1 tablet (81 mg total) by mouth daily.    ? finasteride (PROSCAR) 5 MG tablet TAKE 1 TABLET (5 MG TOTAL) BY MOUTH AT BEDTIME. 90 tablet 0  ? hydrochlorothiazide (HYDRODIURIL) 25 MG tablet Take 1 tablet (25 mg total) by mouth daily. 30 tablet 5  ? losartan (COZAAR) 100 MG tablet Take 1 tablet (100 mg total) by mouth daily. 90 tablet 3  ? metFORMIN (GLUCOPHAGE) 500 MG tablet Take 0.5 tablets (250 mg total) by mouth 2 (two) times daily with a meal. **PLEASE CONTACT OUR OFFICE TO SCHEDULE A FOLLOW UP FOR FUTURE MED REFILLS** 90 tablet 0  ? rosuvastatin (CRESTOR) 40 MG tablet Take 1 tablet (40 mg total) by mouth daily. 90 tablet 3  ? sildenafil (VIAGRA) 100 MG tablet Take 1 tablet (100 mg total) by mouth daily as needed for erectile dysfunction. 30 tablet 2  ? tamsulosin (FLOMAX) 0.4 MG CAPS  capsule Take 1 capsule (0.4 mg total) by mouth every evening. **SCHEDULE A FOLLOW-UP APPT FOR FUTURE REFILLS** 90 capsule 3  ? ?No current facility-administered medications on file prior to visit.  ? ?Allergies  ?Allergen Reactions  ? Atorvastatin Other (See Comments)  ?  Muscle aches ?  ? Crestor [Rosuvastatin] Other (See Comments)  ?  Muscle aches ?  ? Zetia [Ezetimibe] Other (See Comments)  ?  Muscle aches ?  ? ?Assessment/Plan: ? ?1. Hypertension - BP in clinic 136/70 slightly above goal <130/78mHg. Continue losartan '100mg'$  daily, amlodipine '10mg'$  daily, and HCTZ '25mg'$  daily. Pt was advised at last visit to decrease amlodipine due to LE edema but he did not do so and swelling has resolved with starting daily thiazide. Checking BMET today. Discussed options with pt including starting spironolactone 12.'5mg'$  daily if his labs are stable vs working on lifestyle changes + bringing in home cuff and readings to f/u visit in 1 month. Pt prefers to focus on lifestyle changes and monitoring his BP before adding another medication. Reminded pt to bring home cuff to next appt so it can be validated with clinic cuff. Discussed limiting salt intake to less than '2000mg'$  per day. Scheduled HTN f/u for 04/18/22. ? ?Patient seen with SDebria Garret PEl [employer] student ? ?Megan E. Supple, PharmD, BCACP, CPP ?[id number] [place] [address]?Phone: [phone] Fax: [phone]/21/2022 10:02 AM ? ? ?

## 2022-03-21 ENCOUNTER — Ambulatory Visit (INDEPENDENT_AMBULATORY_CARE_PROVIDER_SITE_OTHER): Payer: Medicare Other | Admitting: Pharmacist

## 2022-03-21 VITALS — BP 136/70 | HR 61

## 2022-03-21 DIAGNOSIS — I10 Essential (primary) hypertension: Secondary | ICD-10-CM | POA: Diagnosis not present

## 2022-03-21 LAB — BASIC METABOLIC PANEL
BUN/Creatinine Ratio: 14 (ref 10–24)
BUN: 28 mg/dL — ABNORMAL HIGH (ref 8–27)
CO2: 22 mmol/L (ref 20–29)
Calcium: 9.9 mg/dL (ref 8.6–10.2)
Chloride: 104 mmol/L (ref 96–106)
Creatinine, Ser: 1.94 mg/dL — ABNORMAL HIGH (ref 0.76–1.27)
Glucose: 166 mg/dL — ABNORMAL HIGH (ref 70–99)
Potassium: 4.1 mmol/L (ref 3.5–5.2)
Sodium: 139 mmol/L (ref 134–144)
eGFR: 35 mL/min/{1.73_m2} — ABNORMAL LOW (ref >59)

## 2022-03-21 MED ORDER — AMLODIPINE BESYLATE 10 MG PO TABS: 10.0000 mg | ORAL_TABLET | Freq: Every day | ORAL | 3 refills | Status: DC

## 2022-03-21 NOTE — Patient Instructions (Addendum)
Your blood pressure goal is <130/80 ? ?Continue all medications. Will check labs today. ? ?Follow-up in 1 month. Bring in home blood pressure cuff and readings to next visit. Try to limit salt intake to less than 2000 mg per day ? ?

## 2022-04-17 NOTE — Progress Notes (Unsigned)
Patient ID: James Barnes                 DOB: 01-24-1941                      MRN: [id number]     HPI: James Barnes is a 81 y.o. male referred by Dr. Acie Fredrickson to HTN clinic. PMH is significant for CAD s/p CABG in 2008, CKD, HLD, HTN, and obesity. Last seen by Dr Acie Fredrickson 10/21/21 where BP was on the low side at 108/64. His HCTZ and KDur were discontinued. Follow up BP increased to 148/80. His losartan was increased to '100mg'$  daily at 11/18 visit. I saw pt on 11/04/22 and started amlodipine '5mg'$  daily as BP remained elevated at 162/84. Dose subsequently increased to '10mg'$  daily as home BP remained in the 629B systolic. He reported swelling in his ankles, PCP started him on short course of HCTZ. At next follow up visit, I decreased his amlodipine back to '5mg'$  daily and changed HCTZ to scheduled dosing with subsequent dose increase. Pt also advised to bring in home cuff to next visit to validate with clinic cuff. On 03/21/22 BP was still elevated slightly to 136/70; pt prefers to focus on lifestyle changes and monitoring his BP before adding another medication. Discussed limiting sodium intake to <2000 mg/day.   Pt presents today for HTN f/u appointment. Pt reports med compliance. He takes 2 of his BP meds in the morning (amlodipine + another but unsure of the name) and the other at night. Denies dizziness, lightheadedness, blurred vision, HA, and swelling. His swelling did resolve from last visit. Pt initially stated he was taking '5mg'$  of amlodipine as prescribed but then at the end of his visit said he was taking 2 '5mg'$  tablets daily and never decreased his dose. He did start taking HCTZ '25mg'$  daily. He did not bring his cuff or readings today. States last night's reading was 135/84 (PM) and his BP typically runs in the 130s/80s. Only checks BP at night, after he takes all his medications.  Current HTN meds: losartan '100mg'$  daily, amlodipine '10mg'$  daily, HCTZ '25mg'$  daily Previously tried: HCTZ - stopped when BP was low,  SCr also 2; amlodipine '10mg'$  daily - LE edema  BP goal: <130/80 mmHg  Family History: Mother with history of heart attack  Social History: Smokes a pipe  Diet: salted peanuts, likes meat/steak. Drinks regular soda and coffee (2 servings)  Exercise: Limited due to bilateral hip pain  Wt Readings from Last 3 Encounters:  01/09/22 241 lb 1.9 oz (109.4 kg)  09/20/21 241 lb 6.4 oz (109.5 kg)  08/02/21 241 lb 8 oz (109.5 kg)   BP Readings from Last 3 Encounters:  03/21/22 136/70  02/22/22 (!) 148/78  01/09/22 107/66   Pulse Readings from Last 3 Encounters:  03/21/22 61  02/22/22 (!) 52  01/09/22 78   Renal function: CrCl cannot be calculated (Patient's most recent lab result is older than the maximum 21 days allowed.).  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    CAD (coronary artery disease)    status post CABG in 2008   Colon polyps    Double vision    Dyslipidemia    Hiatal hernia    HTN (hypertension)    Lightheadedness    Obesity    Sinus bradycardia    Hx of  Syncope    with negative echo/Myoview Feb 2013   Current Outpatient Medications on File Prior to Visit  Medication Sig Dispense Refill   amLODipine (NORVASC) 10 MG tablet Take 1 tablet (10 mg total) by mouth daily. 90 tablet 3   aspirin EC 81 MG tablet Take 1 tablet (81 mg total) by mouth daily.     finasteride (PROSCAR) 5 MG tablet TAKE 1 TABLET (5 MG TOTAL) BY MOUTH AT BEDTIME. 90 tablet 0   hydrochlorothiazide (HYDRODIURIL) 25 MG tablet Take 1 tablet (25 mg total) by mouth daily. 30 tablet 5   losartan (COZAAR) 100 MG tablet Take 1 tablet (100 mg total) by mouth daily. 90 tablet 3   metFORMIN (GLUCOPHAGE) 500 MG tablet Take 0.5 tablets (250 mg total) by mouth 2 (two) times daily with a meal. **PLEASE CONTACT OUR OFFICE TO SCHEDULE A FOLLOW UP FOR FUTURE MED REFILLS** 90 tablet 0   rosuvastatin (CRESTOR) 40 MG tablet Take 1 tablet (40 mg total)  by mouth daily. 90 tablet 3   sildenafil (VIAGRA) 100 MG tablet Take 1 tablet (100 mg total) by mouth daily as needed for erectile dysfunction. 30 tablet 2   tamsulosin (FLOMAX) 0.4 MG CAPS capsule Take 1 capsule (0.4 mg total) by mouth every evening. **SCHEDULE A FOLLOW-UP APPT FOR FUTURE REFILLS** 90 capsule 3   No current facility-administered medications on file prior to visit.   Allergies  Allergen Reactions   Atorvastatin Other (See Comments)    Muscle aches    Crestor [Rosuvastatin] Other (See Comments)    Muscle aches    Zetia [Ezetimibe] Other (See Comments)    Muscle aches    Assessment/Plan:  1. Hypertension -    BP in clinic 136/70 slightly above goal <130/54mHg. Continue losartan '100mg'$  daily, amlodipine '10mg'$  daily, and HCTZ '25mg'$  daily. Pt was advised at last visit to decrease amlodipine due to LE edema but he did not do so and swelling has resolved with starting daily thiazide. Checking BMET today. Discussed options with pt including starting spironolactone 12.'5mg'$  daily if his labs are stable vs working on lifestyle changes + bringing in home cuff and readings to f/u visit in 1 month. Pt prefers to focus on lifestyle changes and monitoring his BP before adding another medication. Reminded pt to bring home cuff to next appt so it can be validated with clinic cuff. Discussed limiting salt intake to less than '2000mg'$  per day. Scheduled HTN f/u for 04/18/22.  Patient seen with LPark Liter PY4 PharmD Candidate  Megan E. Supple, PharmD, BCACP, [place] [place] [address]Phone: [phone] Fax: [phone]/22/2023 3:44 PM

## 2022-04-18 ENCOUNTER — Ambulatory Visit (INDEPENDENT_AMBULATORY_CARE_PROVIDER_SITE_OTHER): Payer: Medicare Other | Admitting: Pharmacist

## 2022-04-18 VITALS — BP 110/52 | HR 68

## 2022-04-18 DIAGNOSIS — I10 Essential (primary) hypertension: Secondary | ICD-10-CM | POA: Diagnosis not present

## 2022-04-18 NOTE — Patient Instructions (Signed)
It was great to see you today!  Continue taking your blood pressure medications as prescribed - Losartan 100 mg daily - Amlodipine 10 mg daily - HCTZ 25 mg daily  If you have any questions or concerns please give [place] a call!

## 2022-04-20 ENCOUNTER — Ambulatory Visit (INDEPENDENT_AMBULATORY_CARE_PROVIDER_SITE_OTHER): Payer: Medicare Other | Admitting: Nurse Practitioner

## 2022-04-20 ENCOUNTER — Encounter: Payer: Self-pay | Admitting: Nurse Practitioner

## 2022-04-20 VITALS — BP 103/61 | HR 63 | Temp 97.5°F | Ht 69.0 in | Wt 236.4 lb

## 2022-04-20 DIAGNOSIS — I10 Essential (primary) hypertension: Secondary | ICD-10-CM | POA: Diagnosis not present

## 2022-04-20 DIAGNOSIS — J0140 Acute pansinusitis, unspecified: Secondary | ICD-10-CM | POA: Insufficient documentation

## 2022-04-20 MED ORDER — AZITHROMYCIN 250 MG PO TABS
ORAL_TABLET | ORAL | 0 refills | Status: DC
Start: 2022-04-20 — End: 2022-10-06

## 2022-04-20 NOTE — Progress Notes (Signed)
Established patient visit   Patient: James Barnes   DOB: Sep 15, 1941   81 y.o. Male  MRN: [id number] Visit Date: 04/20/2022  Chief Complaint  Patient presents with   Cough   Nasal Congestion   Subjective    Cough This is a new problem. The current episode started in the past 7 days. The problem has been gradually improving. The cough is Non-productive. Associated symptoms include nasal congestion, postnasal drip and rhinorrhea. Pertinent negatives include no chest pain, chills, ear congestion, ear pain, fever, headaches, myalgias, rash, sore throat, shortness of breath or wheezing. Nothing aggravates the symptoms. He has tried OTC cough suppressant (OTC decongestant) for the symptoms. The treatment provided mild relief. There is no history of environmental allergies.     Medications: Outpatient Medications Prior to Visit  Medication Sig   amLODipine (NORVASC) 10 MG tablet Take 1 tablet (10 mg total) by mouth daily.   aspirin EC 81 MG tablet Take 1 tablet (81 mg total) by mouth daily.   finasteride (PROSCAR) 5 MG tablet TAKE 1 TABLET (5 MG TOTAL) BY MOUTH AT BEDTIME.   hydrochlorothiazide (HYDRODIURIL) 25 MG tablet Take 1 tablet (25 mg total) by mouth daily.   losartan (COZAAR) 100 MG tablet Take 1 tablet (100 mg total) by mouth daily.   metFORMIN (GLUCOPHAGE) 500 MG tablet Take 0.5 tablets (250 mg total) by mouth 2 (two) times daily with a meal. **PLEASE CONTACT OUR OFFICE TO SCHEDULE A FOLLOW UP FOR FUTURE MED REFILLS**   rosuvastatin (CRESTOR) 40 MG tablet Take 1 tablet (40 mg total) by mouth daily.   sildenafil (VIAGRA) 100 MG tablet Take 1 tablet (100 mg total) by mouth daily as needed for erectile dysfunction.   tamsulosin (FLOMAX) 0.4 MG CAPS capsule Take 1 capsule (0.4 mg total) by mouth every evening. **SCHEDULE A FOLLOW-UP APPT FOR FUTURE REFILLS**   No facility-administered medications prior to visit.    Review of Systems  Constitutional:  Negative for activity change,  chills, fatigue and fever.  HENT:  Positive for congestion, postnasal drip and rhinorrhea. Negative for ear pain, sinus pressure, sinus pain, sneezing and sore throat.   Eyes: Negative.   Respiratory:  Positive for cough. Negative for shortness of breath and wheezing.   Cardiovascular:  Negative for chest pain and palpitations.  Gastrointestinal:  Negative for constipation, diarrhea, nausea and vomiting.  Endocrine: Negative for cold intolerance, heat intolerance, polydipsia and polyuria.  Genitourinary:  Negative for dysuria, frequency and urgency.  Musculoskeletal:  Negative for back pain and myalgias.  Skin:  Negative for rash.  Allergic/Immunologic: Negative for environmental allergies.  Neurological:  Negative for dizziness, weakness and headaches.  Psychiatric/Behavioral:  The patient is not nervous/anxious.     Objective     Today's Vitals   04/20/22 0932 04/20/22 0942  BP: (!) 94/58 103/61  Pulse: (!) 56 63  Temp: (!) 97.5 F (36.4 C)   SpO2: 98%   Weight: 236 lb 6.4 oz (107.2 kg)   Height:  '5\' 9"'$  (1.753 m)   Body mass index is 34.91 kg/m.   Physical Exam Vitals and nursing note reviewed.  Constitutional:      Appearance: Normal appearance. He is well-developed. He is ill-appearing.  HENT:     Head: Normocephalic and atraumatic.     Right Ear: Tympanic membrane, ear canal and external ear normal.     Left Ear: Tympanic membrane, ear canal and external ear normal.     Nose: Congestion present.     Mouth/Throat:  Mouth: Mucous membranes are moist.     Pharynx: Oropharynx is clear.  Eyes:     Extraocular Movements: Extraocular movements intact.     Conjunctiva/sclera: Conjunctivae normal.     Pupils: Pupils are equal, round, and reactive to light.  Cardiovascular:     Rate and Rhythm: Normal rate and regular rhythm.     Pulses: Normal pulses.     Heart sounds: Normal heart sounds.  Pulmonary:     Effort: Pulmonary effort is normal.     Breath sounds: Normal  breath sounds.     Comments: Very mild inspiratory wheeze noted with auscultation. Congested, non productive cough present.  Abdominal:     Palpations: Abdomen is soft.  Musculoskeletal:        General: Normal range of motion.     Cervical back: Normal range of motion and neck supple.  Lymphadenopathy:     Cervical: No cervical adenopathy.  Skin:    General: Skin is warm and dry.     Capillary Refill: Capillary refill takes less than 2 seconds.  Neurological:     General: No focal deficit present.     Mental Status: He is alert and oriented to person, place, and time.  Psychiatric:        Mood and Affect: Mood normal.        Behavior: Behavior normal.        Thought Content: Thought content normal.        Judgment: Judgment normal.      Assessment & Plan     1. Acute non-recurrent pansinusitis Start z-pack. Take as directed for 5 days. Rest and increase fluids. Continue using OTC medication to control symptoms.   - azithromycin (ZITHROMAX) 250 MG tablet; z-pack - take as directed for 5 days  Dispense: 6 tablet; Refill: 0  2. Essential hypertension Stable. Continue medication as prescribed. Recommend use of Coricidin HBP to treat symptoms so not to elevate blood pressure.    Return for prn worsening or persistent symptoms.        Ronnell Freshwater, NP  [facility] [phone] (phone) [phone] (fax)  [place]

## 2022-04-25 ENCOUNTER — Other Ambulatory Visit: Payer: Self-pay | Admitting: Nurse Practitioner

## 2022-04-25 DIAGNOSIS — E119 Type 2 diabetes mellitus without complications: Secondary | ICD-10-CM

## 2022-05-17 DIAGNOSIS — N401 Enlarged prostate with lower urinary tract symptoms: Secondary | ICD-10-CM | POA: Diagnosis not present

## 2022-05-17 DIAGNOSIS — R3915 Urgency of urination: Secondary | ICD-10-CM | POA: Diagnosis not present

## 2022-05-29 ENCOUNTER — Other Ambulatory Visit: Payer: Self-pay | Admitting: Nurse Practitioner

## 2022-06-28 ENCOUNTER — Telehealth: Payer: Self-pay | Admitting: Pharmacist

## 2022-06-28 NOTE — Telephone Encounter (Signed)
Pt is closely followed by our HTN clinic. Will route this message to our PharmD team to further follow-up with the pt about med concerns.

## 2022-06-28 NOTE — Telephone Encounter (Signed)
Pt c/o swelling: STAT is pt has developed SOB within 24 hours  If swelling, where is the swelling located? Feet and ankles  How much weight have you gained and in what time span? No sure  Have you gained 3 pounds in a day or 5 pounds in a week? Does not believe so  Do you have a log of your daily weights (if so, list)? no  Are you currently taking a fluid pill? no  Are you currently SOB? no  Have you traveled recently? No    Pt c/o medication issue:  1. Name of Medication: amLODipine (NORVASC) 10 MG tablet  2. How are you currently taking this medication (dosage and times per day)? 1 tablet daily  3. Are you having a reaction (difficulty breathing--STAT)? no  4. What is your medication issue? Patient states he thinks his swelling and heartburn are from the medication. Patient requests call back from [place].

## 2022-06-29 NOTE — Telephone Encounter (Signed)
Patient reports blood pressure of 127/65. In May in clinic BP was lower. He reports that his feet and ankles are swelling and heartburn has been bad. This has been going on for about a week. Attributes it to amlodipine, which has caused swelling in the fast. Nothing makes swelling better. I advised pt skip 1 day of amlodipine and then resume at '5mg'$  daily. He is to call us if blood pressure increases above >130/80.

## 2022-06-29 NOTE — Telephone Encounter (Signed)
Will send again to PharmD in HTN clinic, to follow-up with this pt.  He is asking for Jinny Blossom but she is not in the office at this time.  Will ask for another Pharmacist to assist the pt.

## 2022-06-29 NOTE — Telephone Encounter (Signed)
Pt states that he was never called back yesterday and is requesting a call back today.

## 2022-06-29 NOTE — Telephone Encounter (Signed)
As indicated in this message, Marcelle Overlie [facility] addressed the pts medication concerns.

## 2022-08-10 DIAGNOSIS — N5201 Erectile dysfunction due to arterial insufficiency: Secondary | ICD-10-CM | POA: Diagnosis not present

## 2022-08-10 DIAGNOSIS — N401 Enlarged prostate with lower urinary tract symptoms: Secondary | ICD-10-CM | POA: Diagnosis not present

## 2022-08-10 DIAGNOSIS — R350 Frequency of micturition: Secondary | ICD-10-CM | POA: Diagnosis not present

## 2022-08-24 DIAGNOSIS — R3915 Urgency of urination: Secondary | ICD-10-CM | POA: Diagnosis not present

## 2022-08-29 DIAGNOSIS — Z23 Encounter for immunization: Secondary | ICD-10-CM | POA: Diagnosis not present

## 2022-08-30 DIAGNOSIS — N401 Enlarged prostate with lower urinary tract symptoms: Secondary | ICD-10-CM | POA: Diagnosis not present

## 2022-08-30 DIAGNOSIS — R3915 Urgency of urination: Secondary | ICD-10-CM | POA: Diagnosis not present

## 2022-09-06 ENCOUNTER — Other Ambulatory Visit: Payer: Self-pay | Admitting: Nurse Practitioner

## 2022-09-06 ENCOUNTER — Other Ambulatory Visit: Payer: Self-pay | Admitting: Cardiovascular Disease

## 2022-09-06 DIAGNOSIS — E119 Type 2 diabetes mellitus without complications: Secondary | ICD-10-CM

## 2022-09-09 ENCOUNTER — Other Ambulatory Visit: Payer: Self-pay | Admitting: Nurse Practitioner

## 2022-09-09 ENCOUNTER — Other Ambulatory Visit: Payer: Self-pay | Admitting: Cardiovascular Disease

## 2022-09-09 DIAGNOSIS — E119 Type 2 diabetes mellitus without complications: Secondary | ICD-10-CM

## 2022-09-18 DIAGNOSIS — K644 Residual hemorrhoidal skin tags: Secondary | ICD-10-CM | POA: Diagnosis not present

## 2022-09-18 DIAGNOSIS — Z86010 Personal history of colonic polyps: Secondary | ICD-10-CM | POA: Diagnosis not present

## 2022-09-18 DIAGNOSIS — K5730 Diverticulosis of large intestine without perforation or abscess without bleeding: Secondary | ICD-10-CM | POA: Diagnosis not present

## 2022-09-18 DIAGNOSIS — D123 Benign neoplasm of transverse colon: Secondary | ICD-10-CM | POA: Diagnosis not present

## 2022-09-18 DIAGNOSIS — D122 Benign neoplasm of ascending colon: Secondary | ICD-10-CM | POA: Diagnosis not present

## 2022-09-18 DIAGNOSIS — D124 Benign neoplasm of descending colon: Secondary | ICD-10-CM | POA: Diagnosis not present

## 2022-09-18 DIAGNOSIS — Z09 Encounter for follow-up examination after completed treatment for conditions other than malignant neoplasm: Secondary | ICD-10-CM | POA: Diagnosis not present

## 2022-09-18 DIAGNOSIS — D125 Benign neoplasm of sigmoid colon: Secondary | ICD-10-CM | POA: Diagnosis not present

## 2022-09-18 DIAGNOSIS — D128 Benign neoplasm of rectum: Secondary | ICD-10-CM | POA: Diagnosis not present

## 2022-09-20 DIAGNOSIS — D124 Benign neoplasm of descending colon: Secondary | ICD-10-CM | POA: Diagnosis not present

## 2022-09-20 DIAGNOSIS — D128 Benign neoplasm of rectum: Secondary | ICD-10-CM | POA: Diagnosis not present

## 2022-09-20 DIAGNOSIS — D123 Benign neoplasm of transverse colon: Secondary | ICD-10-CM | POA: Diagnosis not present

## 2022-09-20 DIAGNOSIS — D122 Benign neoplasm of ascending colon: Secondary | ICD-10-CM | POA: Diagnosis not present

## 2022-09-20 DIAGNOSIS — D125 Benign neoplasm of sigmoid colon: Secondary | ICD-10-CM | POA: Diagnosis not present

## 2022-09-26 DIAGNOSIS — Z23 Encounter for immunization: Secondary | ICD-10-CM | POA: Diagnosis not present

## 2022-10-05 ENCOUNTER — Encounter: Payer: Self-pay | Admitting: Cardiovascular Disease

## 2022-10-05 NOTE — Progress Notes (Signed)
James Barnes Date of Birth  April 27, 1941          Problem list: 1. Coronary artery disease-status post CABG in 2008 2. Dyslipidemia 3. Hypertension 4. Syncope  Previous notes.   James Barnes is a 81 y.o. gentleman with the above-noted medical history.  His episodes of presyncope he has gotten out of he has stopped his Altace.  His muscle aches resolved when he stopped the Lipitor.   He has remained active. 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  Oct. 29, 2015:  James Barnes is doing well.  Still working in his wood shop.  Just finished a table and is working on a bed for his grandson.  He stopped his statin - muscle aches. He's tried Lipitor, Crestor, Zetia.  Was not able to tolerate them.    Oct. 28, 2016:   Doing well.   No CP .  No regular exercise.  Working out in his [employer]. intolerent to statins. Has tried Zetia - did not tolerate if for some reason - he cant recall Injectable cholesterol meds were too expensive.   Oct. 31, 2017:  Doing well. No CP or dyspnea. Working in his [employer] smokes his pipe,  Advised cessation.   December 10, 2017:    Does not check his BP  Does not limit his salt intake  No CP or dyspnea. Still smokes a pipe.  Does not exercise.   Looking into getting a 3 wheeled bike.  Has hip issues so does not walk  - still eats some salty foods -  Has been eating country ham recently .  Also has gained some weight   January 13, 2019:   James Barnes is seen today for follow-up visit.  He has a history of hypertension and coronary artery bypass grafting.   No CP , no dyspnea. Is generally fatigued.  Pruned some trees several weeks ago .   Had some DOE walking back up the hill He is unable to exercise because of bilateral hip pain. No angina  Has had diarrhea for the past week or so .   October 01, 2019:  James Barnes is seen today for follow-up of  his coronary artery disease, hyperlipidemia.  He is had coronary artery bypass grafting.  In the past he has not really paid attention to his diet.  He eats anything that he wants.  Several weeks ago.  He had some double vision while walking out of a restaurant.  Lasted 1 -2 minutes.  Went to Dr. Osborne Casco. MRI of the brain revealed a 2 cm chronic CVA .  Has an appt with neuro .   Has had some chest pain last ,  Tightness across his chest after lifting some furniture.  Did not feel like his previous episodes of angina  Lasted for few seconsd.   ,  Worse with bending and twisting his torso.   Also had an episode of lightheadedness while driving . Lasted 30 seconds.   Nov. 5, 2021:  James Barnes is seen today for follow up of his CAD, BP is low this am.   Has had some dizziness recently - especialy with standing  Smokes a pipe Advised to stop smoking   September 20, 2021: James Barnes is seen today for follow-up of his coronary artery disease, hypertension, hyperlipidemia.  Blood pressures on the  low side today. Wt is 241 lbs.  Bp is on the low side . Stays hydrated.  Eats an unrestricted diet .   Nov. 10, 2023  James Barnes is seen for follow up of his CAD, HTN, HLD No CP or dyspnea No real exercise  Advised him to work on exercise Enjoys working in his woodshop    Has some leg edema   Lipids from [facility] = 112 HDL = 42 LDL = 53 Trigs = 82.    Current Outpatient Medications on File Prior to Visit  Medication Sig Dispense Refill   amLODipine (NORVASC) 10 MG tablet Take 0.5 tablets (5 mg total) by mouth daily. 90 tablet 3   aspirin EC 81 MG tablet Take 1 tablet (81 mg total) by mouth daily.     finasteride (PROSCAR) 5 MG tablet TAKE 1 TABLET (5 MG TOTAL) BY MOUTH AT BEDTIME. 90 tablet 0   GEMTESA 75 MG TABS Take 1 tablet by mouth daily.     hydrochlorothiazide (HYDRODIURIL) 25 MG tablet TAKE 1 TABLET (25 MG TOTAL) BY MOUTH DAILY. 15 tablet 0   losartan (COZAAR) 100 MG tablet Take 1 tablet  (100 mg total) by mouth daily. 90 tablet 3   rosuvastatin (CRESTOR) 40 MG tablet Take 1 tablet (40 mg total) by mouth daily. 90 tablet 3   sildenafil (VIAGRA) 100 MG tablet Take 1 tablet (100 mg total) by mouth daily as needed for erectile dysfunction. 30 tablet 2   metFORMIN (GLUCOPHAGE) 500 MG tablet Take 0.5 tablets (250 mg total) by mouth 2 (two) times daily with a meal. 90 tablet 0   tamsulosin (FLOMAX) 0.4 MG CAPS capsule Take 1 capsule (0.4 mg total) by mouth daily after supper. 90 capsule 3   No current facility-administered medications on file prior to visit.    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    CAD (coronary artery disease)    status post CABG in 2008   Colon polyps    Double vision    Dyslipidemia    Hiatal hernia    HTN (hypertension)    Lightheadedness    Obesity    Sinus bradycardia    Hx of   Syncope    with negative echo/Myoview Feb 2013    Past Surgical History:  Procedure Laterality Date   BACK SURGERY  2015   lower   CARDIOVASCULAR STRESS TEST  2013   Negative   COLONOSCOPY WITH PROPOFOL N/A 08/16/2018   Procedure: COLONOSCOPY WITH PROPOFOL;  Surgeon: Laurence Spates, MD;  Location: [facility];  Service: Endoscopy;  Laterality: N/A;   COLONOSCOPY WITH PROPOFOL N/A 09/18/2019   Procedure: COLONOSCOPY WITH PROPOFOL;  Surgeon: Laurence Spates, MD;  Location: [facility];  Service: Endoscopy;  Laterality: N/A;   CORONARY ARTERY BYPASS GRAFT  2008 x 3   with a left internal mammary artery graft to his left anterior descending, right internal mammary  artery graft to his right coronary artery, and  saphenous vein graft to his obtuse marginal.    POLYPECTOMY  08/16/2018    Procedure: POLYPECTOMY;  Surgeon: Laurence Spates, MD;  Location: [facility];  Service: Endoscopy;;   POLYPECTOMY  09/18/2019   Procedure: POLYPECTOMY;  Surgeon: Laurence Spates, MD;  Location: [facility];  Service: Endoscopy;;   US ECHOCARDIOGRAPHY  07-01-2010   EF 55-60%   US ECHOCARDIOGRAPHY  2013   Normal EF; Grade 2 diastolic dysfunction    Social History   Tobacco Use  Smoking Status Every Day   Types: Pipe  Smokeless Tobacco Never    Social History   Substance and Sexual Activity  Alcohol Use No    Family History  Problem Relation Age of Onset   Heart attack Mother     Reviw of Systems:  Noted in current history.  Otherwise his systems are negative.   Physical Exam: Blood pressure 118/72, pulse 71, height '5\' 9"'$  (1.753 m), weight 237 lb (107.5 kg), SpO2 97 %.       GEN:  Well nourished, well developed in no acute distress HEENT: Normal NECK: No JVD; No carotid bruits LYMPHATICS: No lymphadenopathy CARDIAC: RRR , RESPIRATORY:  Clear to auscultation without rales, wheezing or rhonchi  ABDOMEN: Soft, non-tender, non-distended MUSCULOSKELETAL:  1+ leg edema No deformity  SKIN: Warm and dry NEUROLOGIC:  Alert and oriented x 3   ECG:  Nov. 10, 2023:   sinus bradycardia ,  RBBB   Assessment / Plan:   1. Coronary artery disease-  no angina   2. Dyslipidemia-    labs look good   3. Hypertension -    BP is well controlled   4.  Leg edema:  still eats lots of salt ,  recommended leg elevation ,  compession hose , lounge doctor , salt restriction      Mertie Moores, MD  10/06/2022 8:48 AM    [address],  [place], [place]  [address] Pager [phone] Phone: [phone]; Fax: [phone]

## 2022-10-06 ENCOUNTER — Encounter: Payer: Self-pay | Admitting: Cardiovascular Disease

## 2022-10-06 ENCOUNTER — Ambulatory Visit: Payer: Medicare Other | Attending: Cardiovascular Disease | Admitting: Cardiovascular Disease

## 2022-10-06 VITALS — BP 118/72 | HR 71 | Ht 69.0 in | Wt 237.0 lb

## 2022-10-06 DIAGNOSIS — I10 Essential (primary) hypertension: Secondary | ICD-10-CM | POA: Diagnosis not present

## 2022-10-06 DIAGNOSIS — I2510 Atherosclerotic heart disease of native coronary artery without angina pectoris: Secondary | ICD-10-CM | POA: Diagnosis not present

## 2022-10-06 DIAGNOSIS — E119 Type 2 diabetes mellitus without complications: Secondary | ICD-10-CM | POA: Insufficient documentation

## 2022-10-06 DIAGNOSIS — E782 Mixed hyperlipidemia: Secondary | ICD-10-CM | POA: Insufficient documentation

## 2022-10-06 NOTE — Patient Instructions (Addendum)
For your  leg edema you  should do  the following 1. Leg elevation - I recommend the Lounge Dr. Leg rest.  See below for details  2. Salt restriction  -  Use potassium chloride instead of regular salt as a salt substitute.  Avoids processed meats  3. Walk regularly 4. Compression hose - Medical Supply store  5. Weight loss    Available on [email or web address] Or  Go to [email or web address]    Medication Instructions:  Your physician recommends that you continue on your current medications as directed. Please refer to the Current Medication list given to you today.  *If you need a refill on your cardiac medications before your next appointment, please call your pharmacy*   Lab Work: NONE If you have labs (blood work) drawn today and your tests are completely normal, you will receive your results only by: [place] (if you have MyChart) OR A paper copy in the mail If you have any lab test that is abnormal or we need to change your treatment, we will call you to review the results.   Testing/Procedures: NONE   Follow-Up: At [facility], you and your health needs are our priority.  As part of our continuing mission to provide you with exceptional heart care, we have created designated Provider Care Teams.  These Care Teams include your primary Cardiologist (physician) and Advanced Practice Providers (APPs -  Physician Assistants and Nurse Practitioners) who all work together to provide you with the care you need, when you need it.  We recommend signing up for the patient portal called "MyChart".  Sign up information is provided on this After Visit Summary.  MyChart is used to connect with patients for Virtual Visits (Telemedicine).  Patients are able to view lab/test results, encounter notes, upcoming appointments, etc.  Non-urgent messages can be sent to your provider as well.   To learn more about what you can do with MyChart, go to [email or web address].    Your next  appointment:   1 year(s)  The format for your next appointment:   In Person  Provider:   Mertie Moores, MD       Important Information About Sugar

## 2022-10-31 ENCOUNTER — Other Ambulatory Visit: Payer: Self-pay | Admitting: Cardiovascular Disease

## 2022-10-31 NOTE — Telephone Encounter (Signed)
Rx refill sent to pharmacy.Rx refill sent to pharmacy.

## 2022-11-02 ENCOUNTER — Other Ambulatory Visit: Payer: Self-pay | Admitting: Cardiovascular Disease

## 2022-11-29 DIAGNOSIS — N401 Enlarged prostate with lower urinary tract symptoms: Secondary | ICD-10-CM | POA: Diagnosis not present

## 2022-11-29 DIAGNOSIS — R350 Frequency of micturition: Secondary | ICD-10-CM | POA: Diagnosis not present

## 2022-12-14 ENCOUNTER — Telehealth: Payer: Self-pay

## 2022-12-14 NOTE — Patient Outreach (Signed)
  Care Coordination   Initial Visit Note   12/14/2022 Name: VENKAT ANKNEY MRN: [id number] DOB: Aug 19, 1941  [name] is a 82 y.o. year old male who sees Boscia, Greer Ee, NP for primary care. I spoke with  [name] by phone today. Patient refused  What matters to the patients health and wellness today?  N/A    Goals Addressed             This Visit's Progress    COMPLETED: Care Coordination Activities-No follow up required          SDOH assessments and interventions completed:  NoNo     Care Coordination Interventions:  No, not indicated   Follow up plan: No further intervention required.   Encounter Outcome:  Pt. Refused   Jone Baseman, RN, MSN [facility] Care Management Coordinator Direct Line [phone]

## 2022-12-18 ENCOUNTER — Other Ambulatory Visit: Payer: Self-pay | Admitting: Cardiovascular Disease

## 2023-01-04 ENCOUNTER — Other Ambulatory Visit: Payer: Self-pay | Admitting: Nurse Practitioner

## 2023-01-04 DIAGNOSIS — E119 Type 2 diabetes mellitus without complications: Secondary | ICD-10-CM

## 2023-01-04 NOTE — Telephone Encounter (Signed)
L.O.V: 03/31/22  N.O.V: Not scheduled   L.R.F: 10/06/22 90 tab 0 refill Mertie Moores, MD)

## 2023-01-08 ENCOUNTER — Telehealth: Payer: Self-pay | Admitting: *Deleted

## 2023-01-08 ENCOUNTER — Other Ambulatory Visit: Payer: Self-pay | Admitting: Nurse Practitioner

## 2023-01-08 DIAGNOSIS — E119 Type 2 diabetes mellitus without complications: Secondary | ICD-10-CM

## 2023-01-08 MED ORDER — METFORMIN HCL 500 MG PO TABS: 250.0000 mg | ORAL_TABLET | Freq: Two times a day (BID) | ORAL | 0 refills | Status: DC

## 2023-01-08 NOTE — Telephone Encounter (Signed)
Done and sent to [facility]

## 2023-01-08 NOTE — Telephone Encounter (Signed)
Pt came in to schedule appointment.  He needs refill on below to last him till his appt.  Confirmed with pharmacy that we were the last office that sent in medication.     metFORMIN (GLUCOPHAGE) 500 MG tablet    [facility] - [place], [place] - [address]    LOV:04/25/22 ROV:02/09/23

## 2023-01-08 NOTE — Telephone Encounter (Signed)
LVM informing pt of below.Neoma Uhrich Zimmerman Rumple, CMA

## 2023-02-09 ENCOUNTER — Ambulatory Visit (INDEPENDENT_AMBULATORY_CARE_PROVIDER_SITE_OTHER): Payer: Medicare Other | Admitting: Nurse Practitioner

## 2023-02-09 ENCOUNTER — Encounter: Payer: Self-pay | Admitting: Nurse Practitioner

## 2023-02-09 VITALS — BP 107/65 | HR 61 | Ht 69.0 in | Wt 230.1 lb

## 2023-02-09 DIAGNOSIS — E1169 Type 2 diabetes mellitus with other specified complication: Secondary | ICD-10-CM | POA: Diagnosis not present

## 2023-02-09 DIAGNOSIS — E1159 Type 2 diabetes mellitus with other circulatory complications: Secondary | ICD-10-CM

## 2023-02-09 DIAGNOSIS — E119 Type 2 diabetes mellitus without complications: Secondary | ICD-10-CM

## 2023-02-09 DIAGNOSIS — E785 Hyperlipidemia, unspecified: Secondary | ICD-10-CM

## 2023-02-09 DIAGNOSIS — I152 Hypertension secondary to endocrine disorders: Secondary | ICD-10-CM

## 2023-02-09 LAB — POCT GLYCOSYLATED HEMOGLOBIN (HGB A1C): HbA1c POC (<> result, manual entry): 6.6 % (ref 4.0–5.6)

## 2023-02-09 NOTE — Progress Notes (Signed)
Established patient visit   Patient: James Barnes   DOB: 1941/06/27   82 y.o. Male  MRN: [id number] Visit Date: 02/09/2023   Chief Complaint  Patient presents with   Medical Management of Chronic Issues   Subjective    HPI  Follow up  -DM 2  --HgbA1c 6.6 today  -Hypertension  --well managed.  -he denies new concerns or complaints today.  -He denies chest pain, chest pressure, or shortness of breath. He denies headaches or visual disturbances. He denies abdominal pain, nausea, vomiting, or changes in bowel or bladder habits.    Medications: Outpatient Medications Prior to Visit  Medication Sig   amLODipine (NORVASC) 10 MG tablet Take 0.5 tablets (5 mg total) by mouth daily.   aspirin EC 81 MG tablet Take 1 tablet (81 mg total) by mouth daily.   finasteride (PROSCAR) 5 MG tablet TAKE 1 TABLET (5 MG TOTAL) BY MOUTH AT BEDTIME.   GEMTESA 75 MG TABS Take 1 tablet by mouth daily.   hydrochlorothiazide (HYDRODIURIL) 25 MG tablet Take 1 tablet (25 mg total) by mouth daily.   losartan (COZAAR) 100 MG tablet TAKE 1 TABLET (100 MG TOTAL) BY MOUTH DAILY.   MYRBETRIQ 25 MG TB24 tablet Take 25 mg by mouth daily.   rosuvastatin (CRESTOR) 40 MG tablet TAKE 1 TABLET (40 MG TOTAL) BY MOUTH DAILY.   sildenafil (VIAGRA) 100 MG tablet Take 1 tablet (100 mg total) by mouth daily as needed for erectile dysfunction.   tamsulosin (FLOMAX) 0.4 MG CAPS capsule Take 1 capsule (0.4 mg total) by mouth daily after supper.   [DISCONTINUED] metFORMIN (GLUCOPHAGE) 500 MG tablet Take 0.5 tablets (250 mg total) by mouth 2 (two) times daily with a meal.   No facility-administered medications prior to visit.    Review of Systems See HPI    Last CBC Lab Results  Component Value Date   WBC 5.9 06/23/2021   HGB 13.8 06/23/2021   HCT 41.1 06/23/2021   MCV 96 06/23/2021   MCH 32.2 06/23/2021   RDW 13.2 06/23/2021   PLT 298 0000000   Last metabolic panel Lab Results  Component Value Date   GLUCOSE  166 (H) 03/21/2022   NA 139 03/21/2022   K 4.1 03/21/2022   CL 104 03/21/2022   CO2 22 03/21/2022   BUN 28 (H) 03/21/2022   CREATININE 1.94 (H) 03/21/2022   EGFR 35 (L) 03/21/2022   CALCIUM 9.9 03/21/2022   PROT 6.6 02/22/2022   ALBUMIN 4.4 02/22/2022   LABGLOB 2.2 02/22/2022   AGRATIO 2.0 02/22/2022   BILITOT 0.4 02/22/2022   ALKPHOS 65 02/22/2022   AST 23 02/22/2022   ALT 24 02/22/2022   Last lipids Lab Results  Component Value Date   CHOL 112 02/22/2022   HDL 42 02/22/2022   LDLCALC 54 02/22/2022   LDLDIRECT 53 02/22/2022   TRIG 82 02/22/2022   CHOLHDL 2.7 02/22/2022   Last hemoglobin A1c Lab Results  Component Value Date   HGBA1C 6.6 02/09/2023   Last thyroid functions Lab Results  Component Value Date   TSH 3.11 01/03/2021        Objective     Today's Vitals   02/09/23 0853  BP: 107/65  Pulse: 61  SpO2: 96%  Weight: 230 lb 1.9 oz (104.4 kg)  Height: 5\' 9"  (1.753 m)   Body mass index is 33.98 kg/m.  BP Readings from Last 3 Encounters:  02/09/23 107/65  10/06/22 118/72  04/20/22 103/61    Wt  Readings from Last 3 Encounters:  02/09/23 230 lb 1.9 oz (104.4 kg)  10/06/22 237 lb (107.5 kg)  04/20/22 236 lb 6.4 oz (107.2 kg)    Physical Exam Vitals and nursing note reviewed.  Constitutional:      Appearance: Normal appearance. He is well-developed.  HENT:     Head: Normocephalic and atraumatic.     Nose: Nose normal.     Mouth/Throat:     Mouth: Mucous membranes are moist.     Pharynx: Oropharynx is clear.  Eyes:     Extraocular Movements: Extraocular movements intact.     Conjunctiva/sclera: Conjunctivae normal.     Pupils: Pupils are equal, round, and reactive to light.  Neck:     Vascular: No carotid bruit.  Cardiovascular:     Rate and Rhythm: Normal rate and regular rhythm.     Pulses: Normal pulses.     Heart sounds: Normal heart sounds.  Pulmonary:     Effort: Pulmonary effort is normal.     Breath sounds: Normal breath  sounds.  Abdominal:     Palpations: Abdomen is soft.  Musculoskeletal:        General: Normal range of motion.     Cervical back: Normal range of motion and neck supple.  Lymphadenopathy:     Cervical: No cervical adenopathy.  Skin:    General: Skin is warm and dry.     Capillary Refill: Capillary refill takes less than 2 seconds.  Neurological:     General: No focal deficit present.     Mental Status: He is alert and oriented to person, place, and time.  Psychiatric:        Mood and Affect: Mood normal.        Behavior: Behavior normal.        Thought Content: Thought content normal.        Judgment: Judgment normal.     Results for orders placed or performed in visit on 02/09/23  POCT glycosylated hemoglobin (Hb A1C)  Result Value Ref Range   Hemoglobin A1C     HbA1c POC (<> result, manual entry) 6.6 4.0 - 5.6 %   HbA1c, POC (prediabetic range)     HbA1c, POC (controlled diabetic range)      Assessment & Plan    1. Controlled type 2 diabetes mellitus without complication, without long-term current use of insulin (HCC)  HgbA1c 6.6 today. Continue metformin as prescribed. Recheck HgbA1c in 3 months. Unable to proivde urine sample today. Will collect urine microalbumin at next visit.  - POCT glycosylated hemoglobin (Hb A1C)  2. Hypertension associated with diabetes ([place]) Stable. Continue bp medication as prescribed.   3. Hyperlipidemia associated with type 2 diabetes mellitus ([place]) Recent lipid panel normal. Continue crestor as prescribed.  - POCT glycosylated hemoglobin (Hb A1C)   Problem List Items Addressed This Visit       Cardiovascular and Mediastinum   Hypertension associated with diabetes ([place])     Endocrine   Hyperlipidemia associated with type 2 diabetes mellitus ([place])   Relevant Orders   POCT glycosylated hemoglobin (Hb A1C) (Completed)   Controlled type 2 diabetes mellitus without complication, without long-term current use of insulin (HCC) - Primary    Relevant Orders   POCT glycosylated hemoglobin (Hb A1C) (Completed)     Return in about 3 months (around 05/12/2023) for diabetes with HgbA1c check, MWV in 6 - 8 mos, FBW a week prior to visit.  Ronnell Freshwater, NP  [facility] [phone] (phone) [phone] (fax)  [place]

## 2023-03-19 ENCOUNTER — Other Ambulatory Visit: Payer: Self-pay | Admitting: Nurse Practitioner

## 2023-03-19 ENCOUNTER — Telehealth: Payer: Self-pay

## 2023-03-19 DIAGNOSIS — E119 Type 2 diabetes mellitus without complications: Secondary | ICD-10-CM

## 2023-03-19 NOTE — Telephone Encounter (Signed)
Called patient to schedule Medicare Annual Wellness Visit (AWV). Unable to reach patient.  Last date of AWV: 04/29/21  Please schedule an appointment at any time on Annual Wellness Visit schedule.

## 2023-03-20 ENCOUNTER — Other Ambulatory Visit: Payer: Self-pay | Admitting: Nurse Practitioner

## 2023-03-20 DIAGNOSIS — E119 Type 2 diabetes mellitus without complications: Secondary | ICD-10-CM

## 2023-03-20 MED ORDER — METFORMIN HCL 500 MG PO TABS: 250.0000 mg | ORAL_TABLET | Freq: Two times a day (BID) | ORAL | 2 refills | Status: DC

## 2023-03-22 ENCOUNTER — Ambulatory Visit (INDEPENDENT_AMBULATORY_CARE_PROVIDER_SITE_OTHER): Payer: Medicare Other

## 2023-03-22 VITALS — Ht 69.0 in | Wt 237.0 lb

## 2023-03-22 DIAGNOSIS — Z0000 Encounter for general adult medical examination without abnormal findings: Secondary | ICD-10-CM | POA: Diagnosis not present

## 2023-03-22 NOTE — Patient Instructions (Addendum)
Mr. James Barnes , Thank you for taking time to come for your Medicare Wellness Visit. I appreciate your ongoing commitment to your health goals. Please review the following plan we discussed and let me know if I can assist you in the future.   These are the goals we discussed:  Goals       No current goals (pt-stated)        This is a list of the screening recommended for you and due dates:  Health Maintenance  Topic Date Due   Complete foot exam   Never done   DTaP/Tdap/Td vaccine (1 - Tdap) Never done   Eye exam for diabetics  03/22/2023*   Yearly kidney health urinalysis for diabetes  03/23/2023*   COVID-19 Vaccine (3 - Pfizer risk series) 04/07/2023*   Yearly kidney function blood test for diabetes  06/21/2023*   Zoster (Shingles) Vaccine (1 of 2) 06/21/2023*   Pneumonia Vaccine (1 of 2 - PCV) 03/21/2024*   Flu Shot  06/28/2023   Hemoglobin A1C  08/12/2023   Medicare Annual Wellness Visit  03/21/2024   HPV Vaccine  Aged Out  *Topic was postponed. The date shown is not the original due date.    Advanced directives: Please bring a copy of your health care power of attorney and living will to the office to be added to your chart at your convenience.   Conditions/risks identified: None  Next appointment: Follow up in one year for your annual wellness visit.   Preventive Care 32 Years and Older, Male  Preventive care refers to lifestyle choices and visits with your health care provider that can promote health and wellness. What does preventive care include? A yearly physical exam. This is also called an annual well check. Dental exams once or twice a year. Routine eye exams. Ask your health care provider how often you should have your eyes checked. Personal lifestyle choices, including: Daily care of your teeth and gums. Regular physical activity. Eating a healthy diet. Avoiding tobacco and drug use. Limiting alcohol use. Practicing safe sex. Taking low doses of aspirin  every day. Taking vitamin and mineral supplements as recommended by your health care provider. What happens during an annual well check? The services and screenings done by your health care provider during your annual well check will depend on your age, overall health, lifestyle risk factors, and family history of disease. Counseling  Your health care provider may ask you questions about your: Alcohol use. Tobacco use. Drug use. Emotional well-being. Home and relationship well-being. Sexual activity. Eating habits. History of falls. Memory and ability to understand (cognition). Work and work Astronomer. Screening  You may have the following tests or measurements: Height, weight, and BMI. Blood pressure. Lipid and cholesterol levels. These may be checked every 5 years, or more frequently if you are over 6 years old. Skin check. Lung cancer screening. You may have this screening every year starting at age 39 if you have a 30-pack-year history of smoking and currently smoke or have quit within the past 15 years. Fecal occult blood test (FOBT) of the stool. You may have this test every year starting at age 52. Flexible sigmoidoscopy or colonoscopy. You may have a sigmoidoscopy every 5 years or a colonoscopy every 10 years starting at age 72. Prostate cancer screening. Recommendations will vary depending on your family history and other risks. Hepatitis C blood test. Hepatitis B blood test. Sexually transmitted disease (STD) testing. Diabetes screening. This is done by checking your blood  sugar (glucose) after you have not eaten for a while (fasting). You may have this done every 1-3 years. Abdominal aortic aneurysm (AAA) screening. You may need this if you are a current or former smoker. Osteoporosis. You may be screened starting at age 5 if you are at high risk. Talk with your health care provider about your test results, treatment options, and if necessary, the need for more  tests. Vaccines  Your health care provider may recommend certain vaccines, such as: Influenza vaccine. This is recommended every year. Tetanus, diphtheria, and acellular pertussis (Tdap, Td) vaccine. You may need a Td booster every 10 years. Zoster vaccine. You may need this after age 42. Pneumococcal 13-valent conjugate (PCV13) vaccine. One dose is recommended after age 43. Pneumococcal polysaccharide (PPSV23) vaccine. One dose is recommended after age 26. Talk to your health care provider about which screenings and vaccines you need and how often you need them. This information is not intended to replace advice given to you by your health care provider. Make sure you discuss any questions you have with your health care provider. Document Released: 12/10/2015 Document Revised: 08/02/2016 Document Reviewed: 09/14/2015 Elsevier Interactive Patient Education  2017 [place] Prevention in the Home Falls can cause injuries. They can happen to people of all ages. There are many things you can do to make your home safe and to help prevent falls. What can I do on the outside of my home? Regularly fix the edges of walkways and driveways and fix any cracks. Remove anything that might make you trip as you walk through a door, such as a raised step or threshold. Trim any bushes or trees on the path to your home. Use bright outdoor lighting. Clear any walking paths of anything that might make someone trip, such as rocks or tools. Regularly check to see if handrails are loose or broken. Make sure that both sides of any steps have handrails. Any raised decks and porches should have guardrails on the edges. Have any leaves, snow, or ice cleared regularly. Use sand or salt on walking paths during winter. Clean up any spills in your garage right away. This includes oil or grease spills. What can I do in the bathroom? Use night lights. Install grab bars by the toilet and in the tub and shower.  Do not use towel bars as grab bars. Use non-skid mats or decals in the tub or shower. If you need to sit down in the shower, use a plastic, non-slip stool. Keep the floor dry. Clean up any water that spills on the floor as soon as it happens. Remove soap buildup in the tub or shower regularly. Attach bath mats securely with double-sided non-slip rug tape. Do not have throw rugs and other things on the floor that can make you trip. What can I do in the bedroom? Use night lights. Make sure that you have a light by your bed that is easy to reach. Do not use any sheets or blankets that are too big for your bed. They should not hang down onto the floor. Have a firm chair that has side arms. You can use this for support while you get dressed. Do not have throw rugs and other things on the floor that can make you trip. What can I do in the kitchen? Clean up any spills right away. Avoid walking on wet floors. Keep items that you use a lot in easy-to-reach places. If you need to reach something above you,  use a strong step stool that has a grab bar. Keep electrical cords out of the way. Do not use floor polish or wax that makes floors slippery. If you must use wax, use non-skid floor wax. Do not have throw rugs and other things on the floor that can make you trip. What can I do with my stairs? Do not leave any items on the stairs. Make sure that there are handrails on both sides of the stairs and use them. Fix handrails that are broken or loose. Make sure that handrails are as long as the stairways. Check any carpeting to make sure that it is firmly attached to the stairs. Fix any carpet that is loose or worn. Avoid having throw rugs at the top or bottom of the stairs. If you do have throw rugs, attach them to the floor with carpet tape. Make sure that you have a light switch at the top of the stairs and the bottom of the stairs. If you do not have them, ask someone to add them for you. What else  can I do to help prevent falls? Wear shoes that: Do not have high heels. Have rubber bottoms. Are comfortable and fit you well. Are closed at the toe. Do not wear sandals. If you use a stepladder: Make sure that it is fully opened. Do not climb a closed stepladder. Make sure that both sides of the stepladder are locked into place. Ask someone to hold it for you, if possible. Clearly mark and make sure that you can see: Any grab bars or handrails. First and last steps. Where the edge of each step is. Use tools that help you move around (mobility aids) if they are needed. These include: Canes. Walkers. Scooters. Crutches. Turn on the lights when you go into a dark area. Replace any light bulbs as soon as they burn out. Set up your furniture so you have a clear path. Avoid moving your furniture around. If any of your floors are uneven, fix them. If there are any pets around you, be aware of where they are. Review your medicines with your doctor. Some medicines can make you feel dizzy. This can increase your chance of falling. Ask your doctor what other things that you can do to help prevent falls. This information is not intended to replace advice given to you by your health care provider. Make sure you discuss any questions you have with your health care provider. Document Released: 09/09/2009 Document Revised: 04/20/2016 Document Reviewed: 12/18/2014 Elsevier Interactive Patient Education  2017 [employer].

## 2023-03-22 NOTE — Progress Notes (Addendum)
Subjective:   James Barnes is a 82 y.o. male who presents for Medicare Annual/Subsequent preventive examination.  Review of Systems    Virtual Visit via Telephone Note  I connected with  James Barnes on 03/22/23 at  9:00 AM EDT by telephone and verified that I am speaking with the correct person using two identifiers.  Location: Patient: Home Provider: Office Persons participating in the virtual visit: patient/Nurse Health Advisor   I discussed the limitations, risks, security and privacy concerns of performing an evaluation and management service by telephone and the availability of in person appointments. The patient expressed understanding and agreed to proceed.  Interactive audio and video telecommunications were attempted between this nurse and patient, however failed, due to patient having technical difficulties OR patient did not have access to video capability.  We continued and completed visit with audio only.  Some vital signs may be absent or patient reported.   Tillie Rung, LPN  Cardiac Risk Factors include: advanced age (>68men, >88 women);male gender;hypertension     Objective:    Today's Vitals   03/22/23 0855  Weight: 237 lb (107.5 kg)  Height:  (1.753 m)   Body mass index is 35 kg/m.     03/22/2023    9:03 AM 09/18/2019    9:01 AM 08/16/2018   12:58 PM 08/14/2018    4:14 PM  Advanced Directives  Does Patient Have a Medical Advance Directive? Yes No  Yes  Type of Estate agent of [place];Living will  Living will Healthcare Power of [place];Living will  Copy of Healthcare Power of Attorney in Chart? No - copy requested  No - copy requested No - copy requested  Would patient like information on creating a medical advance directive?  No - Patient declined      Current Medications (verified) Outpatient Encounter Medications as of 03/22/2023  Medication Sig   amLODipine (NORVASC) 10 MG tablet Take 0.5 tablets (5 mg  total) by mouth daily.   aspirin EC 81 MG tablet Take 1 tablet (81 mg total) by mouth daily.   finasteride (PROSCAR) 5 MG tablet TAKE 1 TABLET (5 MG TOTAL) BY MOUTH AT BEDTIME.   GEMTESA 75 MG TABS Take 1 tablet by mouth daily.   hydrochlorothiazide (HYDRODIURIL) 25 MG tablet Take 1 tablet (25 mg total) by mouth daily.   losartan (COZAAR) 100 MG tablet TAKE 1 TABLET (100 MG TOTAL) BY MOUTH DAILY.   metFORMIN (GLUCOPHAGE) 500 MG tablet Take 0.5 tablets (250 mg total) by mouth 2 (two) times daily with a meal.   MYRBETRIQ 25 MG TB24 tablet Take 25 mg by mouth daily.   rosuvastatin (CRESTOR) 40 MG tablet TAKE 1 TABLET (40 MG TOTAL) BY MOUTH DAILY.   sildenafil (VIAGRA) 100 MG tablet Take 1 tablet (100 mg total) by mouth daily as needed for erectile dysfunction.   tamsulosin (FLOMAX) 0.4 MG CAPS capsule Take 1 capsule (0.4 mg total) by mouth daily after supper.   No facility-administered encounter medications on file as of 03/22/2023.    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    CAD (coronary artery disease)    status post CABG in 2008   Colon polyps    Double vision    Dyslipidemia    Hiatal hernia    HTN (hypertension)  Lightheadedness    Obesity    Sinus bradycardia    Hx of   Syncope    with negative echo/Myoview Feb 2013   Past Surgical History:  Procedure Laterality Date   BACK SURGERY  2015   lower   CARDIOVASCULAR STRESS TEST  2013   Negative   COLONOSCOPY WITH PROPOFOL N/A 08/16/2018   Procedure: COLONOSCOPY WITH PROPOFOL;  Surgeon: Carman Ching, MD;  Location: [facility];  Service: Endoscopy;  Laterality: N/A;   COLONOSCOPY WITH PROPOFOL N/A 09/18/2019   Procedure: COLONOSCOPY WITH PROPOFOL;  Surgeon: Carman Ching, MD;  Location: [facility];  Service: Endoscopy;  Laterality: N/A;   CORONARY ARTERY BYPASS  GRAFT  2008 x 3   with a left internal mammary artery graft to his left anterior descending, right internal mammary  artery graft to his right coronary artery, and  saphenous vein graft to his obtuse marginal.    POLYPECTOMY  08/16/2018   Procedure: POLYPECTOMY;  Surgeon: Carman Ching, MD;  Location: [facility];  Service: Endoscopy;;   POLYPECTOMY  09/18/2019   Procedure: POLYPECTOMY;  Surgeon: Carman Ching, MD;  Location: [facility];  Service: Endoscopy;;   US ECHOCARDIOGRAPHY  07-01-2010   EF 55-60%   US ECHOCARDIOGRAPHY  2013   Normal EF; Grade 2 diastolic dysfunction   Family History  Problem Relation Age of Onset   Heart attack Mother    Social History   Socioeconomic History   Marital status: Divorced    Spouse name: Not on file   Number of children: Not on file   Years of education: Not on file   Highest education level: Not on file  Occupational History   Not on file  Tobacco Use   Smoking status: Every Day    Types: Pipe   Smokeless tobacco: Never  Vaping Use   Vaping Use: Never used  Substance and Sexual Activity   Alcohol use: No   Drug use: No   Sexual activity: Not on file  Other Topics Concern   Not on file  Social History Narrative   Not on file   Social Determinants of Health   Financial Resource Strain: Low Risk  (03/22/2023)   Overall Financial Resource Strain (CARDIA)    Difficulty of Paying Living Expenses: Not hard at all  Food Insecurity: No Food Insecurity (03/22/2023)   Hunger Vital Sign    Worried About Running Out of Food in the Last Year: Never true    Ran Out of Food in the Last Year: Never true  Transportation Needs: No Transportation Needs (03/22/2023)   PRAPARE - Administrator, Civil Service (Medical): No    Lack of Transportation (Non-Medical): No  Physical Activity: Inactive (03/22/2023)   Exercise Vital Sign    Days of Exercise per Week: 0 days    Minutes of Exercise per Session: 0 min  Stress: No Stress Concern  Present (03/22/2023)   [employer] of Occupational Health - Occupational Stress Questionnaire    Feeling of Stress : Not at all  Social Connections: Moderately Integrated (03/22/2023)   Social Connection and Isolation Panel [NHANES]    Frequency of Communication with Friends and Family: More than three times a week    Frequency of Social Gatherings with Friends and Family: More than three times a week    Attends Religious Services: More than 4 times per year    Active Member of [employer] or Organizations: Yes    Attends Banker Meetings: More than 4  times per year    Marital Status: Divorced    Tobacco Counseling Ready to quit: No Counseling given: Yes   Clinical Intake:  Pre-visit preparation completed: Yes  Pain : No/denies pain     BMI - recorded: 35 Nutritional Status: BMI > 30  Obese Nutritional Risks: None Diabetes: No  How often do you need to have someone help you when you read instructions, pamphlets, or other written materials from your doctor or pharmacy?: 1 - Never  Diabetic? No  Interpreter Needed?: No  Information entered by :: Theresa Mulligan LPN   Activities of Daily Living    03/22/2023    9:01 AM 03/20/2023    7:47 AM  In your present state of health, do you have any difficulty performing the following activities:  Hearing? 1 1  Comment Wears Hearing Aides   Vision? 0 0  Difficulty concentrating or making decisions? 0 0  Walking or climbing stairs? 0 0  Dressing or bathing? 0 0  Doing errands, shopping? 0 0  Preparing Food and eating ? N N  Using the Toilet? N N  In the past six months, have you accidently leaked urine? Y Y  Comment Followed by Urologist   Do you have problems with loss of bowel control? N N  Managing your Medications? N N  Managing your Finances? N N  Housekeeping or managing your Housekeeping? N N    Patient Care Team: Carlean Jews, NP as PCP - General (Family Medicine) Nahser, Deloris Ping, MD as PCP -  Cardiology (Cardiology)  Indicate any recent Medical Services you may have received from other than [facility] providers in the past year (date may be approximate).     Assessment:   This is a routine wellness examination for Domenique.  Hearing/Vision screen Hearing Screening - Comments:: Wears hearing aids Vision Screening - Comments:: Wears rx glasses - up to date with routine eye exams with  [facility]  Dietary issues and exercise activities discussed: Current Exercise Habits: The patient does not participate in regular exercise at present, Exercise limited by: None identified   Goals Addressed               This Visit's Progress     No current goals (pt-stated)         Depression Screen    03/22/2023    9:00 AM 04/20/2022    9:36 AM 01/09/2022   11:25 AM 08/02/2021    8:31 AM 04/29/2021    9:07 AM 01/27/2021    8:40 AM  PHQ 2/9 Scores  PHQ - 2 Score 0 0 0 0 0 0  PHQ- 9 Score  0 1 1 0 1    Fall Risk    03/22/2023    9:02 AM 03/20/2023    7:47 AM 02/09/2023    8:57 AM 01/09/2022   11:25 AM 04/29/2021    9:07 AM  Fall Risk   Falls in the past year? 0 0 0 0 0  Number falls in past yr: 0  0 0 0  Injury with Fall? 0  0 0 0  Risk for fall due to : No Fall Risks      Follow up Falls prevention discussed  Falls evaluation completed Falls evaluation completed Falls evaluation completed    FALL RISK PREVENTION PERTAINING TO THE HOME:  Any stairs in or around the home? No  If so, are there any without handrails? No  Home free of loose throw rugs in  walkways, pet beds, electrical cords, etc? Yes  Adequate lighting in your home to reduce risk of falls? Yes   ASSISTIVE DEVICES UTILIZED TO PREVENT FALLS:  Life alert? No  Use of a cane, walker or w/c? No  Grab bars in the bathroom? No  Shower chair or bench in shower? No  Elevated toilet seat or a handicapped toilet? No   TIMED UP AND GO:  Was the test performed? No . Audio Visit   Cognitive Function:        03/22/2023     9:03 AM 04/29/2021    9:08 AM  6CIT Screen  What Year? 0 points 0 points  What month? 0 points 0 points  What time? 0 points 0 points  Count back from 20 0 points 0 points  Months in reverse 0 points 4 points  Repeat phrase 0 points 4 points  Total Score 0 points 8 points    Immunizations Immunization History  Administered Date(s) Administered   Fluad Quad(high Dose 65+) 09/16/2019   Influenza, High Dose Seasonal PF 09/05/2018   Influenza,inj,Quad PF,6+ Mos 09/26/2016   Influenza-Unspecified 08/29/2022   PFIZER(Purple Top)SARS-COV-2 Vaccination 12/14/2019, 01/02/2020    TDAP status: Due, Education has been provided regarding the importance of this vaccine. Advised may receive this vaccine at local pharmacy or Health Dept. Aware to provide a copy of the vaccination record if obtained from local pharmacy or Health Dept. Verbalized acceptance and understanding.  Flu Vaccine status: Up to date  Pneumococcal vaccine status: Due, Education has been provided regarding the importance of this vaccine. Advised may receive this vaccine at local pharmacy or Health Dept. Aware to provide a copy of the vaccination record if obtained from local pharmacy or Health Dept. Verbalized acceptance and understanding.  Covid-19 vaccine status: Completed vaccines  Qualifies for Shingles Vaccine? Yes   Zostavax completed No   Shingrix Completed?: No.    Education has been provided regarding the importance of this vaccine. Patient has been advised to call insurance company to determine out of pocket expense if they have not yet received this vaccine. Advised may also receive vaccine at local pharmacy or Health Dept. Verbalized acceptance and understanding.  Screening Tests Health Maintenance  Topic Date Due   FOOT EXAM  Never done   DTaP/Tdap/Td (1 - Tdap) Never done   OPHTHALMOLOGY EXAM  03/22/2023 (Originally 04/27/2022)   Diabetic kidney evaluation - Urine ACR  03/23/2023 (Originally 01/27/2022)    COVID-19 Vaccine (3 - Pfizer risk series) 04/07/2023 (Originally 01/30/2020)   Diabetic kidney evaluation - eGFR measurement  06/21/2023 (Originally 03/22/2023)   Zoster Vaccines- Shingrix (1 of 2) 06/21/2023 (Originally 07/10/1960)   Pneumonia Vaccine 74+ Years old (1 of 2 - PCV) 03/21/2024 (Originally 07/10/1947)   INFLUENZA VACCINE  06/28/2023   HEMOGLOBIN A1C  08/12/2023   Medicare Annual Wellness (AWV)  03/21/2024   HPV VACCINES  Aged Out    Health Maintenance  Health Maintenance Due  Topic Date Due   FOOT EXAM  Never done   DTaP/Tdap/Td (1 - Tdap) Never done    Colorectal cancer screening: No longer required.   Lung Cancer Screening: (Low Dose CT Chest recommended if Age 65-80 years, 30 pack-year currently smoking OR have quit w/in 15years.) does qualify.   Lung Cancer Screening Referral: Deferred  Additional Screening:  Hepatitis C Screening: does not qualify; Completed   Vision Screening: Recommended annual ophthalmology exams for early detection of glaucoma and other disorders of the eye. Is the patient up to  date with their annual eye exam?  Yes  Who is the provider or what is the name of the office in which the patient attends annual eye exams? [facility] If pt is not established with a provider, would they like to be referred to a provider to establish care? No .   Dental Screening: Recommended annual dental exams for proper oral hygiene  Community Resource Referral / Chronic Care Management:  CRR required this visit?  No   CCM required this visit?  No      Plan:     I have personally reviewed and noted the following in the patient's chart:   Medical and social history Use of alcohol, tobacco or illicit drugs  Current medications and supplements including opioid prescriptions. Patient is not currently taking opioid prescriptions. Functional ability and status Nutritional status Physical activity Advanced directives List of other  physicians Hospitalizations, surgeries, and ER visits in previous 12 months Vitals Screenings to include cognitive, depression, and falls Referrals and appointments  In addition, I have reviewed and discussed with patient certain preventive protocols, quality metrics, and best practice recommendations. A written personalized care plan for preventive services as well as general preventive health recommendations were provided to patient.     Tillie Rung, LPN   1/61/0960   Nurse Notes: Patient due Diabetic kidney evaluation-Urine ACR and eGFR measurement

## 2023-04-11 ENCOUNTER — Other Ambulatory Visit: Payer: Self-pay | Admitting: Nurse Practitioner

## 2023-04-11 DIAGNOSIS — E1159 Type 2 diabetes mellitus with other circulatory complications: Secondary | ICD-10-CM

## 2023-04-11 DIAGNOSIS — E785 Hyperlipidemia, unspecified: Secondary | ICD-10-CM

## 2023-04-11 DIAGNOSIS — R5383 Other fatigue: Secondary | ICD-10-CM

## 2023-04-11 DIAGNOSIS — E119 Type 2 diabetes mellitus without complications: Secondary | ICD-10-CM

## 2023-04-11 DIAGNOSIS — Z0000 Encounter for general adult medical examination without abnormal findings: Secondary | ICD-10-CM

## 2023-04-30 ENCOUNTER — Other Ambulatory Visit: Payer: Self-pay | Admitting: Cardiovascular Disease

## 2023-05-01 ENCOUNTER — Other Ambulatory Visit: Payer: Self-pay

## 2023-05-01 ENCOUNTER — Emergency Department (HOSPITAL_COMMUNITY): Payer: Medicare Other

## 2023-05-01 ENCOUNTER — Emergency Department (HOSPITAL_COMMUNITY)
Admission: EM | Admit: 2023-05-01 | Discharge: 2023-05-01 | Disposition: A | Discharge status: Home / Self Care | Payer: Medicare Other | Attending: Emergency Medicine | Admitting: Emergency Medicine

## 2023-05-01 DIAGNOSIS — K5792 Diverticulitis of intestine, part unspecified, without perforation or abscess without bleeding: Secondary | ICD-10-CM | POA: Diagnosis not present

## 2023-05-01 DIAGNOSIS — K5732 Diverticulitis of large intestine without perforation or abscess without bleeding: Secondary | ICD-10-CM | POA: Diagnosis not present

## 2023-05-01 DIAGNOSIS — Z7982 Long term (current) use of aspirin: Secondary | ICD-10-CM | POA: Insufficient documentation

## 2023-05-01 DIAGNOSIS — K8020 Calculus of gallbladder without cholecystitis without obstruction: Secondary | ICD-10-CM | POA: Diagnosis not present

## 2023-05-01 DIAGNOSIS — R1032 Left lower quadrant pain: Secondary | ICD-10-CM | POA: Diagnosis present

## 2023-05-01 LAB — CBC WITH DIFFERENTIAL/PLATELET
Abs Immature Granulocytes: 0.03 10*3/uL (ref 0.00–0.07)
Basophils Absolute: 0.1 10*3/uL (ref 0.0–0.1)
Basophils Relative: 1 %
Eosinophils Absolute: 0.2 10*3/uL (ref 0.0–0.5)
Eosinophils Relative: 2 %
HCT: 37.2 % — ABNORMAL LOW (ref 39.0–52.0)
Hemoglobin: 12.6 g/dL — ABNORMAL LOW (ref 13.0–17.0)
Immature Granulocytes: 0 %
Lymphocytes Relative: 22 %
Lymphs Abs: 1.8 10*3/uL (ref 0.7–4.0)
MCH: 32.2 pg (ref 26.0–34.0)
MCHC: 33.9 g/dL (ref 30.0–36.0)
MCV: 95.1 fL (ref 80.0–100.0)
Monocytes Absolute: 0.6 10*3/uL (ref 0.1–1.0)
Monocytes Relative: 8 %
Neutro Abs: 5.5 10*3/uL (ref 1.7–7.7)
Neutrophils Relative %: 67 %
Platelets: 213 10*3/uL (ref 150–400)
RBC: 3.91 MIL/uL — ABNORMAL LOW (ref 4.22–5.81)
RDW: 13.7 % (ref 11.5–15.5)
WBC: 8.2 10*3/uL (ref 4.0–10.5)
nRBC: 0 % (ref 0.0–0.2)

## 2023-05-01 LAB — COMPREHENSIVE METABOLIC PANEL
ALT: 20 U/L (ref 0–44)
AST: 16 U/L (ref 15–41)
Albumin: 4.1 g/dL (ref 3.5–5.0)
Alkaline Phosphatase: 49 U/L (ref 38–126)
Anion gap: 8 (ref 5–15)
BUN: 25 mg/dL — ABNORMAL HIGH (ref 8–23)
CO2: 24 mmol/L (ref 22–32)
Calcium: 9.4 mg/dL (ref 8.9–10.3)
Chloride: 107 mmol/L (ref 98–111)
Creatinine, Ser: 2.05 mg/dL — ABNORMAL HIGH (ref 0.61–1.24)
GFR, Estimated: 32 mL/min — ABNORMAL LOW (ref >60)
Glucose, Bld: 141 mg/dL — ABNORMAL HIGH (ref 70–99)
Potassium: 4 mmol/L (ref 3.5–5.1)
Sodium: 139 mmol/L (ref 135–145)
Total Bilirubin: 0.6 mg/dL (ref 0.3–1.2)
Total Protein: 7.2 g/dL (ref 6.5–8.1)

## 2023-05-01 LAB — URINALYSIS, ROUTINE W REFLEX MICROSCOPIC
Bilirubin Urine: NEGATIVE
Glucose, UA: NEGATIVE mg/dL
Hgb urine dipstick: NEGATIVE
Ketones, ur: NEGATIVE mg/dL
Leukocytes,Ua: NEGATIVE
Nitrite: NEGATIVE
Protein, ur: NEGATIVE mg/dL
Specific Gravity, Urine: 1.016 (ref 1.005–1.030)
pH: 5 (ref 5.0–8.0)

## 2023-05-01 LAB — LIPASE, BLOOD: Lipase: 38 U/L (ref 11–51)

## 2023-05-01 MED ORDER — CIPROFLOXACIN HCL 500 MG PO TABS: 500.0000 mg | ORAL_TABLET | Freq: Two times a day (BID) | ORAL | 0 refills | Status: DC

## 2023-05-01 MED ORDER — METRONIDAZOLE 500 MG PO TABS: 500.0000 mg | ORAL_TABLET | Freq: Two times a day (BID) | ORAL | 0 refills | Status: DC

## 2023-05-01 NOTE — ED Triage Notes (Signed)
Patient arrive POV. AAOX4, NAD. Endorses LLQ abdominal pain, started last night. Denies any urinary symptoms or N/V/D.

## 2023-05-01 NOTE — ED Provider Notes (Signed)
[place] EMERGENCY DEPARTMENT AT [facility] Provider Note   CSN: [id number] Arrival date & time: 05/01/23  4782     History  No chief complaint on file.   James Barnes is a 82 y.o. male.  82 year old male with prior medical history as detailed below presents for evaluation.  Patient complains of acute onset left lower flank pain.  This began yesterday afternoon into the evening.  He reports persistent pain since.  He reports the pain is worse with movement or with twisting of his torso.  He denies associated nausea, vomiting, other abdominal pain, change in bowel movement, change in urination, fever, etc.  He has not taken anything for pain.  He declines pain medication here in the ED.  He denies prior history of renal colic.  The history is provided by the patient and medical records.       Home Medications Prior to Admission medications   Medication Sig Start Date End Date Taking? Authorizing Provider  amLODipine (NORVASC) 10 MG tablet Take 0.5 tablets (5 mg total) by mouth daily. 06/29/22   Nahser, Deloris Ping, MD  aspirin EC 81 MG tablet Take 1 tablet (81 mg total) by mouth daily. 09/24/14   Nahser, Deloris Ping, MD  finasteride (PROSCAR) 5 MG tablet TAKE 1 TABLET (5 MG TOTAL) BY MOUTH AT BEDTIME. 11/22/21   Boscia, Heather E, NP  GEMTESA 75 MG TABS Take 1 tablet by mouth daily. 09/18/22   [provider]  hydrochlorothiazide (HYDRODIURIL) 25 MG tablet TAKE 1 TABLET (25 MG TOTAL) BY MOUTH DAILY. 04/30/23   Nahser, Deloris Ping, MD  losartan (COZAAR) 100 MG tablet TAKE 1 TABLET (100 MG TOTAL) BY MOUTH DAILY. 10/31/22   Nahser, Deloris Ping, MD  metFORMIN (GLUCOPHAGE) 500 MG tablet Take 0.5 tablets (250 mg total) by mouth 2 (two) times daily with a meal. 03/20/23   Boscia, Kathlynn Grate, NP  MYRBETRIQ 25 MG TB24 tablet Take 25 mg by mouth daily. 12/13/22   [provider]  rosuvastatin (CRESTOR) 40 MG tablet TAKE 1 TABLET (40 MG TOTAL) BY MOUTH DAILY. 12/20/22    Nahser, Deloris Ping, MD  sildenafil (VIAGRA) 100 MG tablet Take 1 tablet (100 mg total) by mouth daily as needed for erectile dysfunction. 04/29/21   Carlean Jews, NP  tamsulosin (FLOMAX) 0.4 MG CAPS capsule Take 1 capsule (0.4 mg total) by mouth daily after supper. 10/06/22   Nahser, Deloris Ping, MD      Allergies    Atorvastatin, Crestor [rosuvastatin], and Zetia [ezetimibe]    Review of Systems   Review of Systems  All other systems reviewed and are negative.   Physical Exam Updated Vital Signs BP 115/70 (BP Location: Left Arm)   Pulse 94   Temp 97.9 F (36.6 C) (Oral)   Resp 18   Ht 5\' 9"  (1.753 m)   Wt 107.5 kg   SpO2 99%   BMI 35.00 kg/m  Physical Exam Vitals and nursing note reviewed.  Constitutional:      General: He is not in acute distress.    Appearance: Normal appearance. He is well-developed.  HENT:     Head: Normocephalic and atraumatic.  Eyes:     Conjunctiva/sclera: Conjunctivae normal.     Pupils: Pupils are equal, round, and reactive to light.  Cardiovascular:     Rate and Rhythm: Normal rate and regular rhythm.     Heart sounds: Normal heart sounds.  Pulmonary:     Effort: Pulmonary effort is  normal. No respiratory distress.     Breath sounds: Normal breath sounds.  Abdominal:     General: There is no distension.     Palpations: Abdomen is soft.     Tenderness: There is no abdominal tenderness.  Musculoskeletal:        General: No deformity. Normal range of motion.     Cervical back: Normal range of motion and neck supple.     Comments: Mild tenderness localized to the mid lower left flank.  Discrete area of tenderness measuring approximately 6 x 6 cm.  No associated CVA tenderness.  No anterior abdominal discomfort or tenderness with palpation.  No abdominal rebound or guarding.  Skin:    General: Skin is warm and dry.  Neurological:     General: No focal deficit present.     Mental Status: He is alert and oriented to person, place, and time.      ED Results / Procedures / Treatments   Labs (all labs ordered are listed, but only abnormal results are displayed) Labs Reviewed  CBC WITH DIFFERENTIAL/PLATELET - Abnormal; Notable for the following components:      Result Value   RBC 3.91 (*)    Hemoglobin 12.6 (*)    HCT 37.2 (*)    All other components within normal limits  COMPREHENSIVE METABOLIC PANEL - Abnormal; Notable for the following components:   Glucose, Bld 141 (*)    BUN 25 (*)    Creatinine, Ser 2.05 (*)    GFR, Estimated 32 (*)    All other components within normal limits  LIPASE, BLOOD  URINALYSIS, ROUTINE W REFLEX MICROSCOPIC    EKG None  Radiology No results found.  Procedures Procedures    Medications Ordered in ED Medications - No data to display  ED Course/ Medical Decision Making/ A&P                             Medical Decision Making Amount and/or Complexity of Data Reviewed Labs: ordered. Radiology: ordered.  Risk Prescription drug management.    Medical Screen Complete  This patient presented to the ED with complaint of left flank pain.  This complaint involves an extensive number of treatment options. The initial differential diagnosis includes, but is not limited to, muscular strain, renal colic, other intra-abdominal pathology  This presentation is: Acute, Self-Limited, Previously Undiagnosed, Uncertain Prognosis, Complicated, Systemic Symptoms, and Threat to Life/Bodily Function  Patient is presenting with complaint of acute onset left flank pain.  History and exam are perhaps most consistent with muscular strain.  However given patient's age and symptoms will obtain screening labs, blood work, UA, and CT imaging to rule out more significant acute pathology.  CT imaging obtained suggest mild acute sigmoid diverticulitis.  Patient's other screening labs obtained are without significant abnormality.  Patient educated regarding outpatient course of treatment  appropriate for sigmoid diverticulitis.  Patient understands need for close outpatient follow-up.  He has both established PCP and GI in the outpatient setting.  Strict return precautions given and understood.    Additional history obtained: External records from outside sources obtained and reviewed including prior ED visits and prior Inpatient records.    Lab Tests:  I ordered and personally interpreted labs.     Imaging Studies ordered:  I ordered imaging studies including CT AP  I independently visualized and interpreted obtained imaging which showed sigmoid diverticulitis I agree with the radiologist interpretation.   Cardiac Monitoring:  The patient was maintained on a cardiac monitor.  I personally viewed and interpreted the cardiac monitor which showed an underlying rhythm of: NSR  Problem List / ED Course:  Diverticulitis   Reevaluation:  After the interventions noted above, I reevaluated the patient and found that they have: improved   Disposition:  After consideration of the diagnostic results and the patients response to treatment, I feel that the patent would benefit from close outpatient follow-up.          Final Clinical Impression(s) / ED Diagnoses Final diagnoses:  Diverticulitis    Rx / DC Orders ED Discharge Orders          Ordered    metroNIDAZOLE (FLAGYL) 500 MG tablet  2 times daily        05/01/23 1051    ciprofloxacin (CIPRO) 500 MG tablet  Every 12 hours        05/01/23 1051              Wynetta Fines, MD 05/01/23 1058

## 2023-05-01 NOTE — Discharge Instructions (Signed)
Return for any problem.  ?

## 2023-05-04 ENCOUNTER — Other Ambulatory Visit: Payer: Medicare Other

## 2023-05-04 DIAGNOSIS — R5383 Other fatigue: Secondary | ICD-10-CM

## 2023-05-04 DIAGNOSIS — E785 Hyperlipidemia, unspecified: Secondary | ICD-10-CM | POA: Diagnosis not present

## 2023-05-04 DIAGNOSIS — E1159 Type 2 diabetes mellitus with other circulatory complications: Secondary | ICD-10-CM

## 2023-05-04 DIAGNOSIS — E119 Type 2 diabetes mellitus without complications: Secondary | ICD-10-CM

## 2023-05-04 DIAGNOSIS — I152 Hypertension secondary to endocrine disorders: Secondary | ICD-10-CM | POA: Diagnosis not present

## 2023-05-04 DIAGNOSIS — Z0000 Encounter for general adult medical examination without abnormal findings: Secondary | ICD-10-CM

## 2023-05-04 DIAGNOSIS — E1169 Type 2 diabetes mellitus with other specified complication: Secondary | ICD-10-CM

## 2023-05-05 LAB — TSH: TSH: 4.47 u[IU]/mL (ref 0.450–4.500)

## 2023-05-05 LAB — COMPREHENSIVE METABOLIC PANEL
ALT: 19 IU/L (ref 0–44)
AST: 26 IU/L (ref 0–40)
Albumin/Globulin Ratio: 1.9 (ref 1.2–2.2)
Albumin: 3.9 g/dL (ref 3.8–4.8)
Alkaline Phosphatase: 55 IU/L (ref 44–121)
BUN/Creatinine Ratio: 12 (ref 10–24)
BUN: 31 mg/dL — ABNORMAL HIGH (ref 8–27)
Bilirubin Total: 0.3 mg/dL (ref 0.0–1.2)
CO2: 18 mmol/L — ABNORMAL LOW (ref 20–29)
Calcium: 9.5 mg/dL (ref 8.6–10.2)
Chloride: 106 mmol/L (ref 96–106)
Creatinine, Ser: 2.51 mg/dL — ABNORMAL HIGH (ref 0.76–1.27)
Globulin, Total: 2.1 g/dL (ref 1.5–4.5)
Glucose: 195 mg/dL — ABNORMAL HIGH (ref 70–99)
Potassium: 4.3 mmol/L (ref 3.5–5.2)
Sodium: 141 mmol/L (ref 134–144)
Total Protein: 6 g/dL (ref 6.0–8.5)
eGFR: 25 mL/min/{1.73_m2} — ABNORMAL LOW (ref >59)

## 2023-05-05 LAB — LIPID PANEL
Chol/HDL Ratio: 2.2 ratio (ref 0.0–5.0)
Cholesterol, Total: 98 mg/dL — ABNORMAL LOW (ref 100–199)
HDL: 44 mg/dL (ref >39)
LDL Chol Calc (NIH): 42 mg/dL (ref 0–99)
Triglycerides: 51 mg/dL (ref 0–149)
VLDL Cholesterol Cal: 12 mg/dL (ref 5–40)

## 2023-05-05 LAB — CBC WITH DIFFERENTIAL/PLATELET
Basophils Absolute: 0.1 10*3/uL (ref 0.0–0.2)
Basos: 1 %
EOS (ABSOLUTE): 0.3 10*3/uL (ref 0.0–0.4)
Eos: 5 %
Hematocrit: 34.8 % — ABNORMAL LOW (ref 37.5–51.0)
Hemoglobin: 11.5 g/dL — ABNORMAL LOW (ref 13.0–17.7)
Immature Grans (Abs): 0 10*3/uL (ref 0.0–0.1)
Immature Granulocytes: 0 %
Lymphocytes Absolute: 1.8 10*3/uL (ref 0.7–3.1)
Lymphs: 31 %
MCH: 31.7 pg (ref 26.6–33.0)
MCHC: 33 g/dL (ref 31.5–35.7)
MCV: 96 fL (ref 79–97)
Monocytes Absolute: 0.5 10*3/uL (ref 0.1–0.9)
Monocytes: 9 %
Neutrophils Absolute: 3 10*3/uL (ref 1.4–7.0)
Neutrophils: 54 %
Platelets: 228 10*3/uL (ref 150–450)
RBC: 3.63 x10E6/uL — ABNORMAL LOW (ref 4.14–5.80)
RDW: 13 % (ref 11.6–15.4)
WBC: 5.6 10*3/uL (ref 3.4–10.8)

## 2023-05-05 LAB — HEMOGLOBIN A1C
Est. average glucose Bld gHb Est-mCnc: 140 mg/dL
Hgb A1c MFr Bld: 6.5 % — ABNORMAL HIGH (ref 4.8–5.6)

## 2023-05-13 NOTE — Progress Notes (Signed)
Established patient visit   Patient: James Barnes   DOB: 1941/10/05   82 y.o. Male  MRN: [id number] Visit Date: 05/14/2023   Chief Complaint  Patient presents with   Medical Management of Chronic Issues   Subjective    HPI  Follow up  -bilateral swelling in both ankles  -started about 2 weeks ago  -GERD --started about 2 weeks ago Was seen and treated for diverticulitis in ED about 2 weeks ago. This is when problems started. During ED visit, renal functions were moderately elevated. Hgb and Hct were mildly low. Should be rechecked today .   Medications: Outpatient Medications Prior to Visit  Medication Sig   amLODipine (NORVASC) 10 MG tablet Take 0.5 tablets (5 mg total) by mouth daily.   aspirin EC 81 MG tablet Take 1 tablet (81 mg total) by mouth daily.   ciprofloxacin (CIPRO) 500 MG tablet Take 1 tablet (500 mg total) by mouth every 12 (twelve) hours.   finasteride (PROSCAR) 5 MG tablet TAKE 1 TABLET (5 MG TOTAL) BY MOUTH AT BEDTIME.   GEMTESA 75 MG TABS Take 1 tablet by mouth daily.   hydrochlorothiazide (HYDRODIURIL) 25 MG tablet TAKE 1 TABLET (25 MG TOTAL) BY MOUTH DAILY.   losartan (COZAAR) 100 MG tablet TAKE 1 TABLET (100 MG TOTAL) BY MOUTH DAILY.   metFORMIN (GLUCOPHAGE) 500 MG tablet Take 0.5 tablets (250 mg total) by mouth 2 (two) times daily with a meal.   metroNIDAZOLE (FLAGYL) 500 MG tablet Take 1 tablet (500 mg total) by mouth 2 (two) times daily.   MYRBETRIQ 25 MG TB24 tablet Take 25 mg by mouth daily.   rosuvastatin (CRESTOR) 40 MG tablet TAKE 1 TABLET (40 MG TOTAL) BY MOUTH DAILY.   sildenafil (VIAGRA) 100 MG tablet Take 1 tablet (100 mg total) by mouth daily as needed for erectile dysfunction.   tamsulosin (FLOMAX) 0.4 MG CAPS capsule Take 1 capsule (0.4 mg total) by mouth daily after supper.   No facility-administered medications prior to visit.    Review of Systems See HPI     Last CBC Lab Results  Component Value Date   WBC 4.9 05/14/2023   HGB  11.9 (L) 05/14/2023   HCT 36.3 (L) 05/14/2023   MCV 96 05/14/2023   MCH 31.6 05/14/2023   RDW 13.5 05/14/2023   PLT 237 05/14/2023   Last metabolic panel Lab Results  Component Value Date   GLUCOSE 126 (H) 05/14/2023   NA 142 05/14/2023   K 4.3 05/14/2023   CL 107 (H) 05/14/2023   CO2 21 05/14/2023   BUN 34 (H) 05/14/2023   CREATININE 2.27 (H) 05/14/2023   EGFR 28 (L) 05/14/2023   CALCIUM 9.7 05/14/2023   PROT 6.0 05/04/2023   ALBUMIN 3.9 05/04/2023   LABGLOB 2.1 05/04/2023   AGRATIO 1.9 05/04/2023   BILITOT 0.3 05/04/2023   ALKPHOS 55 05/04/2023   AST 26 05/04/2023   ALT 19 05/04/2023   ANIONGAP 8 05/01/2023   Last lipids Lab Results  Component Value Date   CHOL 98 (L) 05/04/2023   HDL 44 05/04/2023   LDLCALC 42 05/04/2023   LDLDIRECT 53 02/22/2022   TRIG 51 05/04/2023   CHOLHDL 2.2 05/04/2023   Last hemoglobin A1c Lab Results  Component Value Date   HGBA1C 6.5 (H) 05/04/2023   Last thyroid functions Lab Results  Component Value Date   TSH 4.470 05/04/2023       Objective     Today's Vitals   05/14/23 1015  BP: 97/62  Pulse: 68  SpO2: 97%  Weight: 235 lb 6.4 oz (106.8 kg)  Height: 5\' 9"  (1.753 m)   Body mass index is 34.76 kg/m.  BP Readings from Last 3 Encounters:  05/14/23 97/62  05/01/23 115/70  02/09/23 107/65    Wt Readings from Last 3 Encounters:  05/14/23 235 lb 6.4 oz (106.8 kg)  05/01/23 236 lb 15.9 oz (107.5 kg)  03/22/23 237 lb (107.5 kg)    Physical Exam Vitals and nursing note reviewed.  Constitutional:      Appearance: Normal appearance. He is well-developed.  HENT:     Head: Normocephalic and atraumatic.     Nose: Nose normal.     Mouth/Throat:     Mouth: Mucous membranes are moist.     Pharynx: Oropharynx is clear.  Eyes:     Extraocular Movements: Extraocular movements intact.     Conjunctiva/sclera: Conjunctivae normal.     Pupils: Pupils are equal, round, and reactive to light.  Neck:     Vascular: No  carotid bruit.  Cardiovascular:     Rate and Rhythm: Normal rate and regular rhythm.     Pulses: Normal pulses.     Heart sounds: Normal heart sounds.  Pulmonary:     Effort: Pulmonary effort is normal.     Breath sounds: Normal breath sounds.  Abdominal:     Palpations: Abdomen is soft.  Musculoskeletal:        General: Normal range of motion.     Cervical back: Normal range of motion and neck supple.  Lymphadenopathy:     Cervical: No cervical adenopathy.  Skin:    General: Skin is warm and dry.     Capillary Refill: Capillary refill takes less than 2 seconds.  Neurological:     General: No focal deficit present.     Mental Status: He is alert and oriented to person, place, and time.  Psychiatric:        Mood and Affect: Mood normal.        Behavior: Behavior normal.        Thought Content: Thought content normal.        Judgment: Judgment normal.     Results for orders placed or performed in visit on 05/14/23  Basic Metabolic Panel (BMET)  Result Value Ref Range   Glucose 126 (H) 70 - 99 mg/dL   BUN 34 (H) 8 - 27 mg/dL   Creatinine, Ser 1.61 (H) 0.76 - 1.27 mg/dL   eGFR 28 (L) >09 UE/AVW/0.98   BUN/Creatinine Ratio 15 10 - 24   Sodium 142 134 - 144 mmol/L   Potassium 4.3 3.5 - 5.2 mmol/L   Chloride 107 (H) 96 - 106 mmol/L   CO2 21 20 - 29 mmol/L   Calcium 9.7 8.6 - 10.2 mg/dL  CBC  Result Value Ref Range   WBC 4.9 3.4 - 10.8 x10E3/uL   RBC 3.77 (L) 4.14 - 5.80 x10E6/uL   Hemoglobin 11.9 (L) 13.0 - 17.7 g/dL   Hematocrit 11.9 (L) 14.7 - 51.0 %   MCV 96 79 - 97 fL   MCH 31.6 26.6 - 33.0 pg   MCHC 32.8 31.5 - 35.7 g/dL   RDW 82.9 56.2 - 13.0 %   Platelets 237 150 - 450 x10E3/uL  POCT UA - Microalbumin  Result Value Ref Range   Microalbumin Ur, POC 30 mg/L   Creatinine, POC 300 mg/dL   Albumin/Creatinine Ratio, Urine, POC <30     Assessment &  Plan    Controlled type 2 diabetes mellitus without complication, without long-term current use of insulin  (HCC) Assessment & Plan: Hemoglobin A1c on 05/04/2023 was 6.5. -Normal urine microalbumin today. -Check renal functions via BMP today.  -Continue continue diabetic medication as prescribed.  Orders: -     POCT UA - Microalbumin  Hypertension associated with diabetes (HCC) Assessment & Plan: Stable. -Continue current medication.   Orders: -     POCT UA - Microalbumin  Hyperlipidemia associated with type 2 diabetes mellitus (HCC) Assessment & Plan: Stable. -Continue rosuvastatin as previously prescribed.  Orders: -     POCT UA - Microalbumin  Abnormal kidney function Assessment & Plan: Recheck BMP today.  Orders: -     Basic metabolic panel; Future  Iron deficiency anemia, unspecified iron deficiency anemia type Assessment & Plan: Recheck hemoglobin mammogram today.  Orders: -     CBC; Future  Heartburn Assessment & Plan: Trial omeprazole 40 mg daily. -Encouraged to implement intake of spicy and acidic foods. -Recommend avoid eating 1 to 2 hours prior to bedtime.  Patient sleep with HOB raised at 30 degrees.  Orders: -     Omeprazole; Take 1 capsule (40 mg total) by mouth daily.  Dispense: 30 capsule; Refill: 3     Return in about 2 weeks (around 05/28/2023).         Carlean Jews, NP  [facility] [phone] (phone) [phone] (fax)  [facility]

## 2023-05-14 ENCOUNTER — Encounter: Payer: Self-pay | Admitting: Nurse Practitioner

## 2023-05-14 ENCOUNTER — Ambulatory Visit (INDEPENDENT_AMBULATORY_CARE_PROVIDER_SITE_OTHER): Payer: Medicare Other | Admitting: Nurse Practitioner

## 2023-05-14 VITALS — BP 97/62 | HR 68 | Ht 69.0 in | Wt 235.4 lb

## 2023-05-14 DIAGNOSIS — R12 Heartburn: Secondary | ICD-10-CM

## 2023-05-14 DIAGNOSIS — Z7984 Long term (current) use of oral hypoglycemic drugs: Secondary | ICD-10-CM

## 2023-05-14 DIAGNOSIS — E1159 Type 2 diabetes mellitus with other circulatory complications: Secondary | ICD-10-CM

## 2023-05-14 DIAGNOSIS — N289 Disorder of kidney and ureter, unspecified: Secondary | ICD-10-CM | POA: Diagnosis not present

## 2023-05-14 DIAGNOSIS — D509 Iron deficiency anemia, unspecified: Secondary | ICD-10-CM

## 2023-05-14 DIAGNOSIS — I152 Hypertension secondary to endocrine disorders: Secondary | ICD-10-CM | POA: Diagnosis not present

## 2023-05-14 DIAGNOSIS — E785 Hyperlipidemia, unspecified: Secondary | ICD-10-CM

## 2023-05-14 DIAGNOSIS — E119 Type 2 diabetes mellitus without complications: Secondary | ICD-10-CM

## 2023-05-14 DIAGNOSIS — E1169 Type 2 diabetes mellitus with other specified complication: Secondary | ICD-10-CM

## 2023-05-14 LAB — POCT UA - MICROALBUMIN
Albumin/Creatinine Ratio, Urine, POC: <30
Creatinine, POC: 300 mg/dL
Microalbumin Ur, POC: 30 mg/L

## 2023-05-14 MED ORDER — OMEPRAZOLE 40 MG PO CPDR
40.0000 mg | DELAYED_RELEASE_CAPSULE | Freq: Every day | ORAL | 3 refills | Status: DC
Start: 2023-05-14 — End: 2023-12-24

## 2023-05-14 NOTE — Patient Instructions (Signed)
Stop daily dose amlodipine 5 mg daily  Drink plenty of water and avoid excess salt.  Start omeprazole daily  Follow up In 2 weeks for recheck

## 2023-05-14 NOTE — Progress Notes (Signed)
Reviewed with patient during visit. Rechecked CBC and BMP

## 2023-05-15 LAB — CBC
Hematocrit: 36.3 % — ABNORMAL LOW (ref 37.5–51.0)
Hemoglobin: 11.9 g/dL — ABNORMAL LOW (ref 13.0–17.7)
MCH: 31.6 pg (ref 26.6–33.0)
MCHC: 32.8 g/dL (ref 31.5–35.7)
MCV: 96 fL (ref 79–97)
Platelets: 237 10*3/uL (ref 150–450)
RBC: 3.77 x10E6/uL — ABNORMAL LOW (ref 4.14–5.80)
RDW: 13.5 % (ref 11.6–15.4)
WBC: 4.9 10*3/uL (ref 3.4–10.8)

## 2023-05-15 LAB — BASIC METABOLIC PANEL
BUN/Creatinine Ratio: 15 (ref 10–24)
BUN: 34 mg/dL — ABNORMAL HIGH (ref 8–27)
CO2: 21 mmol/L (ref 20–29)
Calcium: 9.7 mg/dL (ref 8.6–10.2)
Chloride: 107 mmol/L — ABNORMAL HIGH (ref 96–106)
Creatinine, Ser: 2.27 mg/dL — ABNORMAL HIGH (ref 0.76–1.27)
Glucose: 126 mg/dL — ABNORMAL HIGH (ref 70–99)
Potassium: 4.3 mmol/L (ref 3.5–5.2)
Sodium: 142 mmol/L (ref 134–144)
eGFR: 28 mL/min/{1.73_m2} — ABNORMAL LOW (ref >59)

## 2023-05-27 DIAGNOSIS — D509 Iron deficiency anemia, unspecified: Secondary | ICD-10-CM | POA: Insufficient documentation

## 2023-05-27 DIAGNOSIS — N289 Disorder of kidney and ureter, unspecified: Secondary | ICD-10-CM | POA: Insufficient documentation

## 2023-05-27 DIAGNOSIS — R12 Heartburn: Secondary | ICD-10-CM | POA: Insufficient documentation

## 2023-05-27 DIAGNOSIS — N184 Chronic kidney disease, stage 4 (severe): Secondary | ICD-10-CM | POA: Insufficient documentation

## 2023-05-27 NOTE — Assessment & Plan Note (Signed)
Trial omeprazole 40 mg daily. -Encouraged to implement intake of spicy and acidic foods. -Recommend avoid eating 1 to 2 hours prior to bedtime.  Patient sleep with HOB raised at 30 degrees.

## 2023-05-27 NOTE — Assessment & Plan Note (Addendum)
Stable. -Continue rosuvastatin as previously prescribed.

## 2023-05-27 NOTE — Assessment & Plan Note (Signed)
Recheck hemoglobin mammogram today.

## 2023-05-27 NOTE — Assessment & Plan Note (Signed)
Recheck BMP today.  

## 2023-05-27 NOTE — Assessment & Plan Note (Signed)
Stable.  Continue current medication

## 2023-05-27 NOTE — Assessment & Plan Note (Signed)
Hemoglobin A1c on 05/04/2023 was 6.5. -Normal urine microalbumin today. -Check renal functions via BMP today.  -Continue continue diabetic medication as prescribed.

## 2023-05-27 NOTE — Assessment & Plan Note (Signed)
>>  ASSESSMENT AND PLAN FOR ABNORMAL KIDNEY FUNCTION WRITTEN ON 05/27/2023  6:18 PM BY BOSCIA, HEATHER E, NP  Recheck BMP today.

## 2023-05-28 NOTE — Progress Notes (Signed)
His labs are stable, but renal functions are still pretty high. He was supposed to have follow up this week. He should schedule this. May need referral to nephrology for further evaluation.   Thanks so much.   -HB

## 2023-05-29 ENCOUNTER — Telehealth: Payer: Self-pay

## 2023-05-29 NOTE — Telephone Encounter (Signed)
Called pt to give lab results someone answered they stated it was the wrong number. Called daughter's phone  no answered on VM to LVM

## 2023-06-07 ENCOUNTER — Other Ambulatory Visit: Payer: Self-pay | Admitting: Nurse Practitioner

## 2023-06-11 ENCOUNTER — Other Ambulatory Visit: Payer: Self-pay | Admitting: Nurse Practitioner

## 2023-06-11 ENCOUNTER — Other Ambulatory Visit: Payer: Self-pay | Admitting: Cardiovascular Disease

## 2023-08-30 DIAGNOSIS — Z85828 Personal history of other malignant neoplasm of skin: Secondary | ICD-10-CM | POA: Diagnosis not present

## 2023-08-30 DIAGNOSIS — L738 Other specified follicular disorders: Secondary | ICD-10-CM | POA: Diagnosis not present

## 2023-08-30 DIAGNOSIS — D487 Neoplasm of uncertain behavior of other specified sites: Secondary | ICD-10-CM | POA: Diagnosis not present

## 2023-08-30 DIAGNOSIS — D485 Neoplasm of uncertain behavior of skin: Secondary | ICD-10-CM | POA: Diagnosis not present

## 2023-09-27 DIAGNOSIS — Z23 Encounter for immunization: Secondary | ICD-10-CM | POA: Diagnosis not present

## 2023-10-20 ENCOUNTER — Encounter: Payer: Self-pay | Admitting: Cardiovascular Disease

## 2023-10-20 NOTE — Progress Notes (Signed)
 No show  This encounter was created in error - please disregard.

## 2023-10-22 ENCOUNTER — Encounter: Payer: Medicare Other | Admitting: Cardiovascular Disease

## 2023-10-23 ENCOUNTER — Encounter: Payer: Self-pay | Admitting: Cardiovascular Disease

## 2023-10-23 ENCOUNTER — Ambulatory Visit: Payer: Medicare Other | Admitting: Podiatry

## 2023-11-05 ENCOUNTER — Other Ambulatory Visit: Payer: Self-pay | Admitting: Cardiovascular Disease

## 2023-11-05 ENCOUNTER — Other Ambulatory Visit: Payer: Self-pay | Admitting: Nurse Practitioner

## 2023-11-05 DIAGNOSIS — E119 Type 2 diabetes mellitus without complications: Secondary | ICD-10-CM

## 2023-11-05 NOTE — Telephone Encounter (Signed)
This patient was last seen by me in 03/2023. He probably needs a visit to have this refilled. Thanks. Herbert Seta, NP

## 2023-11-06 ENCOUNTER — Ambulatory Visit (INDEPENDENT_AMBULATORY_CARE_PROVIDER_SITE_OTHER): Payer: Medicare Other | Admitting: Podiatry

## 2023-11-06 ENCOUNTER — Encounter: Payer: Self-pay | Admitting: Podiatry

## 2023-11-06 ENCOUNTER — Ambulatory Visit (INDEPENDENT_AMBULATORY_CARE_PROVIDER_SITE_OTHER): Payer: Medicare Other

## 2023-11-06 DIAGNOSIS — M792 Neuralgia and neuritis, unspecified: Secondary | ICD-10-CM | POA: Diagnosis not present

## 2023-11-06 DIAGNOSIS — M778 Other enthesopathies, not elsewhere classified: Secondary | ICD-10-CM | POA: Diagnosis not present

## 2023-11-06 DIAGNOSIS — M25552 Pain in left hip: Secondary | ICD-10-CM

## 2023-11-06 NOTE — Progress Notes (Signed)
Subjective:   Patient ID: James Barnes, male   DOB: 82 y.o.   MRN: [id number]   HPI Chief Complaint  Patient presents with   Foot Pain    RM#11 Bilateral foot pain for several months now states it was the shoes he had been wearing.    82 year old male presents the office the above concerns.  He states has been out of the last couple months.  He does not report any treatment.  Has not been any swelling.  He states that occurred 1 time when he woke up and on that he had some numbness to his feet.  He got up and moved around and felt better.  He states that started wearing some different shoes.  He has since changed shoes.  He also states that he has been having some issues with his left hip.  He thinks he walks differently and this affects his feet.  Review of Systems  All other systems reviewed and are negative.  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    CAD (coronary artery disease)    status post CABG in 2008   Colon polyps    Double vision    Dyslipidemia    Hiatal hernia    HTN (hypertension)    Lightheadedness    Obesity    Sinus bradycardia    Hx of   Syncope    with negative echo/Myoview Feb 2013    Past Surgical History:  Procedure Laterality Date   BACK SURGERY  2015   lower   CARDIOVASCULAR STRESS TEST  2013   Negative   COLONOSCOPY WITH PROPOFOL N/A 08/16/2018   Procedure: COLONOSCOPY WITH PROPOFOL;  Surgeon: Carman Ching, MD;  Location: [facility];  Service: Endoscopy;  Laterality: N/A;   COLONOSCOPY WITH PROPOFOL N/A 09/18/2019   Procedure: COLONOSCOPY WITH PROPOFOL;  Surgeon: Carman Ching, MD;  Location: [facility];  Service: Endoscopy;  Laterality: N/A;   CORONARY ARTERY BYPASS GRAFT  2008 x 3   with a left internal mammary artery graft to his left anterior descending, right internal mammary  artery graft to his right coronary artery, and  saphenous vein graft to his obtuse marginal.     POLYPECTOMY  08/16/2018   Procedure: POLYPECTOMY;  Surgeon: Carman Ching, MD;  Location: [facility];  Service: Endoscopy;;   POLYPECTOMY  09/18/2019   Procedure: POLYPECTOMY;  Surgeon: Carman Ching, MD;  Location: [facility];  Service: Endoscopy;;   US ECHOCARDIOGRAPHY  07-01-2010   EF 55-60%   US ECHOCARDIOGRAPHY  2013   Normal EF; Grade 2 diastolic dysfunction     Current Outpatient Medications:    amLODipine (NORVASC) 10 MG tablet, TAKE 1 TABLET (10 MG TOTAL) BY MOUTH DAILY., Disp: 30 tablet, Rfl: 0   aspirin EC 81 MG tablet, Take 1 tablet (81 mg total) by mouth daily., Disp: , Rfl:    ciprofloxacin (CIPRO) 500 MG tablet, Take 1 tablet (500 mg total) by mouth every 12 (twelve) hours., Disp: 10 tablet, Rfl: 0   finasteride (PROSCAR) 5 MG tablet, TAKE 1 TABLET (5 MG TOTAL) BY MOUTH AT BEDTIME., Disp: 90 tablet, Rfl: 0   GEMTESA 75 MG TABS, Take 1 tablet by mouth daily., Disp: , Rfl:    metFORMIN (GLUCOPHAGE) 500 MG tablet, Take 0.5 tablets (250 mg total) by mouth 2 (two) times daily with a meal., Disp: 60 tablet, Rfl: 2  metroNIDAZOLE (FLAGYL) 500 MG tablet, Take 1 tablet (500 mg total) by mouth 2 (two) times daily., Disp: 14 tablet, Rfl: 0   MYRBETRIQ 25 MG TB24 tablet, Take 25 mg by mouth daily., Disp: , Rfl:    omeprazole (PRILOSEC) 40 MG capsule, Take 1 capsule (40 mg total) by mouth daily., Disp: 30 capsule, Rfl: 3   rosuvastatin (CRESTOR) 40 MG tablet, TAKE 1 TABLET (40 MG TOTAL) BY MOUTH DAILY., Disp: 90 tablet, Rfl: 3   sildenafil (VIAGRA) 100 MG tablet, Take 1 tablet (100 mg total) by mouth daily as needed for erectile dysfunction., Disp: 30 tablet, Rfl: 2   tamsulosin (FLOMAX) 0.4 MG CAPS capsule, TAKE 1 CAPSULE BY MOUTH EVERY EVENING., Disp: 90 capsule, Rfl: 3   hydrochlorothiazide (HYDRODIURIL) 25 MG tablet, TAKE 1 TABLET (25 MG TOTAL) BY MOUTH DAILY., Disp: 30 tablet, Rfl: 1   losartan (COZAAR) 100 MG tablet, TAKE 1 TABLET (100 MG TOTAL) BY MOUTH DAILY., Disp: 30 tablet,  Rfl: 1  Allergies  Allergen Reactions   Atorvastatin Other (See Comments)    Muscle aches    Crestor [Rosuvastatin] Other (See Comments)    Muscle aches    Zetia [Ezetimibe] Other (See Comments)    Muscle aches            Objective:  Physical Exam  General: AAO x3, NAD  Dermatological: Skin is warm, dry and supple bilateral.  There are no open sores, no preulcerative lesions, no rash or signs of infection present.  Vascular: Dorsalis Pedis artery and Posterior Tibial artery pedal pulses are 2/4 bilateral with immedate capillary fill time. There is no pain with calf compression, swelling, warmth, erythema.   Neruologic: Sensation intact per [facility] monofilament bilaterally.  Decreased vibratory sensation on the left side on the first MPJ compared to the right side.  Negative tinel sign.  Musculoskeletal: Unable to appreciate any area pinpoint tenderness.  Flexor, extensor tendons are intact.  No discomfort bilaterally.  No edema, erythema.      Assessment:   82 year old male with neuritis     Plan:  -Treatment options discussed including all alternatives, risks, and complications -Etiology of symptoms were discussed -X-rays were obtained and reviewed with the patient.  X-rays obtained reviewed bilaterally.  No evidence of acute fracture noted.  Calcaneal spurring present.  Spurring present at the midfoot on the lateral view. -Comments there is anything to be isolated issue.  He states that he feels that he is walking differently because of his hip.  Referral placed for further evaluation of this.  Discussed shoes, good support.  He feels that if he wears his shoes better support makes it hurt more.  Discussed comfortable shoes.  If symptoms were to recur let me know.  Return if symptoms worsen or fail to improve.  Vivi Barrack DPM

## 2023-11-09 NOTE — Telephone Encounter (Signed)
LVM for pt to call office to see about scheduling an appointment and then seeing about refilling medication to last until appt.

## 2023-11-12 ENCOUNTER — Telehealth: Payer: Self-pay

## 2023-11-12 ENCOUNTER — Other Ambulatory Visit: Payer: Self-pay

## 2023-11-12 ENCOUNTER — Other Ambulatory Visit: Payer: Self-pay | Admitting: Family Medicine

## 2023-11-12 DIAGNOSIS — E119 Type 2 diabetes mellitus without complications: Secondary | ICD-10-CM

## 2023-11-12 MED ORDER — METFORMIN HCL 500 MG PO TABS
250.0000 mg | ORAL_TABLET | Freq: Two times a day (BID) | ORAL | 2 refills | Status: DC
Start: 2023-11-12 — End: 2023-11-19

## 2023-11-12 NOTE — Telephone Encounter (Signed)
Refill sent in for metformin

## 2023-11-12 NOTE — Telephone Encounter (Signed)
Prescription Request  11/12/2023  LOV: 05/14/23  What is the name of the medication or equipment?  metFORMIN (GLUCOPHAGE) 500 MG tablet   Have you contacted your pharmacy to request a refill? Yes   Which pharmacy would you like this sent to?  [facility] - [place], [place] - [address] [place] [address] Phone: [phone] Fax: [phone]    Patient notified that their request is being sent to the clinical staff for review and that they should receive a response within 2 business days.   Please advise at Mobile [phone] (mobile)  Pt scheduled an appt for 11/19/23

## 2023-11-19 ENCOUNTER — Encounter: Payer: Self-pay | Admitting: Family Medicine

## 2023-11-19 ENCOUNTER — Ambulatory Visit (INDEPENDENT_AMBULATORY_CARE_PROVIDER_SITE_OTHER): Payer: Medicare Other | Admitting: Family Medicine

## 2023-11-19 VITALS — BP 97/61 | HR 74 | Ht 69.0 in | Wt 237.0 lb

## 2023-11-19 DIAGNOSIS — N400 Enlarged prostate without lower urinary tract symptoms: Secondary | ICD-10-CM | POA: Diagnosis not present

## 2023-11-19 DIAGNOSIS — I152 Hypertension secondary to endocrine disorders: Secondary | ICD-10-CM | POA: Diagnosis not present

## 2023-11-19 DIAGNOSIS — E119 Type 2 diabetes mellitus without complications: Secondary | ICD-10-CM | POA: Diagnosis not present

## 2023-11-19 DIAGNOSIS — E1159 Type 2 diabetes mellitus with other circulatory complications: Secondary | ICD-10-CM | POA: Diagnosis not present

## 2023-11-19 DIAGNOSIS — E1169 Type 2 diabetes mellitus with other specified complication: Secondary | ICD-10-CM

## 2023-11-19 DIAGNOSIS — N184 Chronic kidney disease, stage 4 (severe): Secondary | ICD-10-CM | POA: Diagnosis not present

## 2023-11-19 DIAGNOSIS — L299 Pruritus, unspecified: Secondary | ICD-10-CM

## 2023-11-19 DIAGNOSIS — M533 Sacrococcygeal disorders, not elsewhere classified: Secondary | ICD-10-CM | POA: Diagnosis not present

## 2023-11-19 DIAGNOSIS — E785 Hyperlipidemia, unspecified: Secondary | ICD-10-CM

## 2023-11-19 LAB — POCT GLYCOSYLATED HEMOGLOBIN (HGB A1C): HbA1c POC (<> result, manual entry): 6.6 % (ref 4.0–5.6)

## 2023-11-19 MED ORDER — FLUOCINOLONE ACETONIDE 0.01 % OT OIL: 1.0000 [drp] | TOPICAL_OIL | Freq: Every day | OTIC | 1 refills | Status: AC

## 2023-11-19 MED ORDER — FINASTERIDE 5 MG PO TABS
5.0000 mg | ORAL_TABLET | Freq: Every day | ORAL | 0 refills | Status: DC
Start: 2023-11-19 — End: 2024-02-25

## 2023-11-19 NOTE — Assessment & Plan Note (Signed)
Right-sided low back pain after lifting something a few weeks ago.  Pain is not midline.  Does not appear to be related to bulging disc.  Recommended continued conservative management with heat, topical therapy, Tylenol.

## 2023-11-19 NOTE — Progress Notes (Signed)
Established Patient Office Visit  Subjective   Patient ID: James Barnes, male    DOB: [birth year]  Age: 82 y.o. MRN: [id number]  Chief Complaint  Patient presents with   Medical Management of Chronic Issues    HPI  DM2-patient takes half a tablet of metformin 500 twice a day.  Does not take other diabetes medications.  We discussed his CKD and needing to stop this medication due to his kidney function.  Patient agreeable to stopping and rechecking in 3 months.  CKD4-at patient's last visit in June his GFR was less than 30.  This was repeated a week later and still less than 30.  Discussed with patient that when kidney function drops to this level it is recommended to see a nephrologist, as well as adjust some of his medications.  Hypertension-patient taking losartan, hydrochlorothiazide, amlodipine  BPH-patient taking tamsulosin.  Initially states he is taking finasteride but discussed how it looks like this has not been filled since 2022.  Patient agreeable to restart it.  He is complaining of urinary frequency.  No feelings of incomplete emptying, no difficulty starting or stopping urine.  Hyperlipidemia-patient states he is taking Crestor.  Denies having muscle aches with any statins in the past.  Patient is complaining of bilateral itching in the ears for several months now.  He wears hearing aids.  Does not feel that he has any hearing loss or ear congestion.  Back pain-patient states he hurt his low back "putting something in his truck" a few weeks ago.  Pain does not radiate down the legs.  Is more on the right side than midline.  Could not say whether it is aching, burning or sharp pain.  Feels like it is little worse today.  The ASCVD Risk score (Arnett DK, et al., 2019) failed to calculate for the following reasons:   The 2019 ASCVD risk score is only valid for ages 88 to 34  Health Maintenance Due  Topic Date Due   DTaP/Tdap/Td (1 - Tdap) Never done   Zoster Vaccines-  Shingrix (1 of 2) Never done   OPHTHALMOLOGY EXAM  04/27/2022   COVID-19 Vaccine (3 - 2024-25 season) 07/29/2023      Objective:     BP 97/61   Pulse 74   Ht 5\' 9"  (1.753 m)   Wt 237 lb (107.5 kg)   SpO2 91%   BMI 35.00 kg/m    Physical Exam General: Alert, oriented HEENT: Left tympanic membrane normal.  Right tympanic membrane obstructed by cerumen. CV: Regular rate and rhythm Pulmonary: Lungs clear bilaterally MSK: Mild tenderness to palpation over the right PSIS.  No midline tenderness.   Results for orders placed or performed in visit on 11/19/23  POCT HgB A1C  Result Value Ref Range   Hemoglobin A1C     HbA1c POC (<> result, manual entry) 6.6 4.0 - 5.6 %   HbA1c, POC (prediabetic range)     HbA1c, POC (controlled diabetic range)          Assessment & Plan:   Controlled type 2 diabetes mellitus without complication, without long-term current use of insulin (HCC) Assessment & Plan: A1c 6.6 today.  Was taking 250 mg twice daily of metformin.  Advised him to stop due to his kidney function/CKD 4  Orders: -     POCT glycosylated hemoglobin (Hb A1C)  Prostatic hypertrophy Assessment & Plan: Patient complaining of urinary frequency.  Takes Flomax.  Was taking finasteride but appears he has not  filled this prescription in over a year.  I do not see where it was advised he stopped taking it.  Orders: -     Finasteride; Take 1 tablet (5 mg total) by mouth at bedtime.  Dispense: 90 tablet; Refill: 0  CKD (chronic kidney disease) stage 4, GFR 15-29 ml/min (HCC) Assessment & Plan: GFR less than 30 on 2 occasions in June.  Stopped metformin.  Referring to nephrology.  Orders: -     Ambulatory referral to Nephrology  Hypertension associated with diabetes [facility]) Assessment & Plan: Continue current medications: Amlodipine, HCTZ, losartan.   Hyperlipidemia associated with type 2 diabetes mellitus (HCC) Assessment & Plan: Patient denies muscle aches in association  with statins.  Endorses taking Crestor.  Continue Crestor.   Ear itching Assessment & Plan: Possibly asthma of the ear.  Symptoms do not sound like eustachian tube dysfunction although this could be the issue.  Will prescribe otic fluocinolone nightly for bed for 2 weeks.  Recheck at next visit.   Sacroiliac dysfunction Assessment & Plan: Right-sided low back pain after lifting something a few weeks ago.  Pain is not midline.  Does not appear to be related to bulging disc.  Recommended continued conservative management with heat, topical therapy, Tylenol.   Other orders -     Fluocinolone Acetonide; Place 1 drop in ear(s) at bedtime.  Dispense: 20 mL; Refill: 1     Return in about 3 months (around 02/17/2024) for DM.    Sandre Kitty, MD

## 2023-11-19 NOTE — Assessment & Plan Note (Signed)
GFR less than 30 on 2 occasions in June.  Stopped metformin.  Referring to nephrology.

## 2023-11-19 NOTE — Patient Instructions (Signed)
It was nice to see you today,  We addressed the following topics today: -I would like you to stop taking the metformin.  This is because your kidney function is below a certain cutoff point for this medication.  I would also like to send in a referral to the nephrologist.  Someone will call you to schedule this - For your ear complaints I have prescribed a eardrop for you to put in your ears prior to going to bed each night for the next 2 weeks - For your urinary frequency I will send in a new prescription for the finasteride.  Looks like at some point he may have stopped taking this. - For your back pain I would recommend using topical heat and over-the-counter treatments like Voltaren gel.  If you need to take an oral medication try Tylenol first.  Have a great day,  Frederic Jericho, MD

## 2023-11-19 NOTE — Assessment & Plan Note (Signed)
Patient denies muscle aches in association with statins.  Endorses taking Crestor.  Continue Crestor.

## 2023-11-19 NOTE — Assessment & Plan Note (Signed)
Continue current medications: Amlodipine, HCTZ, losartan.

## 2023-11-19 NOTE — Assessment & Plan Note (Signed)
Possibly asthma of the ear.  Symptoms do not sound like eustachian tube dysfunction although this could be the issue.  Will prescribe otic fluocinolone nightly for bed for 2 weeks.  Recheck at next visit.

## 2023-11-19 NOTE — Assessment & Plan Note (Signed)
A1c 6.6 today.  Was taking 250 mg twice daily of metformin.  Advised him to stop due to his kidney function/CKD 4

## 2023-11-19 NOTE — Assessment & Plan Note (Signed)
Patient complaining of urinary frequency.  Takes Flomax.  Was taking finasteride but appears he has not filled this prescription in over a year.  I do not see where it was advised he stopped taking it.

## 2023-11-22 ENCOUNTER — Emergency Department (HOSPITAL_COMMUNITY)
Admission: EM | Admit: 2023-11-22 | Discharge: 2023-11-22 | Disposition: A | Discharge status: Home / Self Care | Payer: Medicare Other | Attending: Emergency Medicine | Admitting: Emergency Medicine

## 2023-11-22 ENCOUNTER — Emergency Department (HOSPITAL_COMMUNITY): Payer: Medicare Other

## 2023-11-22 ENCOUNTER — Encounter (HOSPITAL_COMMUNITY): Payer: Self-pay

## 2023-11-22 ENCOUNTER — Other Ambulatory Visit: Payer: Self-pay

## 2023-11-22 DIAGNOSIS — M5441 Lumbago with sciatica, right side: Secondary | ICD-10-CM | POA: Diagnosis not present

## 2023-11-22 DIAGNOSIS — K4020 Bilateral inguinal hernia, without obstruction or gangrene, not specified as recurrent: Secondary | ICD-10-CM | POA: Diagnosis not present

## 2023-11-22 DIAGNOSIS — M545 Low back pain, unspecified: Secondary | ICD-10-CM | POA: Diagnosis present

## 2023-11-22 DIAGNOSIS — R109 Unspecified abdominal pain: Secondary | ICD-10-CM | POA: Insufficient documentation

## 2023-11-22 DIAGNOSIS — Z7982 Long term (current) use of aspirin: Secondary | ICD-10-CM | POA: Diagnosis not present

## 2023-11-22 DIAGNOSIS — N281 Cyst of kidney, acquired: Secondary | ICD-10-CM | POA: Diagnosis not present

## 2023-11-22 DIAGNOSIS — M47816 Spondylosis without myelopathy or radiculopathy, lumbar region: Secondary | ICD-10-CM | POA: Diagnosis not present

## 2023-11-22 DIAGNOSIS — M5127 Other intervertebral disc displacement, lumbosacral region: Secondary | ICD-10-CM | POA: Diagnosis not present

## 2023-11-22 DIAGNOSIS — M5126 Other intervertebral disc displacement, lumbar region: Secondary | ICD-10-CM | POA: Diagnosis not present

## 2023-11-22 DIAGNOSIS — I700 Atherosclerosis of aorta: Secondary | ICD-10-CM | POA: Diagnosis not present

## 2023-11-22 DIAGNOSIS — K449 Diaphragmatic hernia without obstruction or gangrene: Secondary | ICD-10-CM | POA: Diagnosis not present

## 2023-11-22 DIAGNOSIS — K8020 Calculus of gallbladder without cholecystitis without obstruction: Secondary | ICD-10-CM | POA: Diagnosis not present

## 2023-11-22 DIAGNOSIS — M48061 Spinal stenosis, lumbar region without neurogenic claudication: Secondary | ICD-10-CM | POA: Diagnosis not present

## 2023-11-22 MED ORDER — DICLOFENAC SODIUM 1 % EX GEL: 4.0000 g | Freq: Four times a day (QID) | CUTANEOUS | 0 refills | Status: AC

## 2023-11-22 MED ORDER — METHYLPREDNISOLONE 4 MG PO TBPK: ORAL_TABLET | ORAL | 0 refills | Status: DC

## 2023-11-22 MED ORDER — ACETAMINOPHEN 500 MG PO TABS
1000.0000 mg | ORAL_TABLET | Freq: Once | ORAL | Status: AC
  Administered 2023-11-22: 1000 mg via ORAL
  Filled 2023-11-22: qty 2

## 2023-11-22 MED ORDER — KETOROLAC TROMETHAMINE 15 MG/ML IJ SOLN
15.0000 mg | Freq: Once | INTRAMUSCULAR | Status: AC
  Administered 2023-11-22: 15 mg via INTRAMUSCULAR
  Filled 2023-11-22: qty 1

## 2023-11-22 MED ORDER — METHOCARBAMOL 500 MG PO TABS: 500.0000 mg | ORAL_TABLET | Freq: Two times a day (BID) | ORAL | 0 refills | Status: AC

## 2023-11-22 MED ORDER — OXYCODONE HCL 5 MG PO TABS
5.0000 mg | ORAL_TABLET | Freq: Once | ORAL | Status: AC
  Administered 2023-11-22: 5 mg via ORAL
  Filled 2023-11-22: qty 1

## 2023-11-22 NOTE — ED Provider Notes (Signed)
[place] EMERGENCY DEPARTMENT AT [facility] Provider Note   CSN: [id number] Arrival date & time: 11/22/23  1016     History  Chief Complaint  Patient presents with   Back Pain    James Barnes is a 82 y.o. male.  82 yo M with a chief complaints of right-sided low back pain that radiates down the leg.  This been going on for at least a month.  He denies any injury to the area.  Denies loss of bowel or bladder denies loss of perirectal sensation.  Has had trouble with his back before but was on the other side.  Has had a prior spinal surgery.  He denies fevers.  Denies abdominal pain.   Back Pain      Home Medications Prior to Admission medications   Medication Sig Start Date End Date Taking? Authorizing Provider  amLODipine (NORVASC) 10 MG tablet TAKE 1 TABLET (10 MG TOTAL) BY MOUTH DAILY. 06/12/23   Nahser, Deloris Ping, MD  aspirin EC 81 MG tablet Take 1 tablet (81 mg total) by mouth daily. 09/24/14   Nahser, Deloris Ping, MD  finasteride (PROSCAR) 5 MG tablet Take 1 tablet (5 mg total) by mouth at bedtime. 11/19/23   Sandre Kitty, MD  Fluocinolone Acetonide 0.01 % OIL Place 1 drop in ear(s) at bedtime. 11/19/23   Sandre Kitty, MD  GEMTESA 75 MG TABS Take 1 tablet by mouth daily. 09/18/22   [provider]  hydrochlorothiazide (HYDRODIURIL) 25 MG tablet TAKE 1 TABLET (25 MG TOTAL) BY MOUTH DAILY. 11/07/23   Nahser, Deloris Ping, MD  losartan (COZAAR) 100 MG tablet TAKE 1 TABLET (100 MG TOTAL) BY MOUTH DAILY. 11/07/23   Nahser, Deloris Ping, MD  MYRBETRIQ 25 MG TB24 tablet Take 25 mg by mouth daily. 12/13/22   [provider]  omeprazole (PRILOSEC) 40 MG capsule Take 1 capsule (40 mg total) by mouth daily. 05/14/23   Carlean Jews, NP  rosuvastatin (CRESTOR) 40 MG tablet TAKE 1 TABLET (40 MG TOTAL) BY MOUTH DAILY. 12/20/22   Nahser, Deloris Ping, MD  sildenafil (VIAGRA) 100 MG tablet Take 1 tablet (100 mg total) by mouth daily as needed for erectile  dysfunction. 04/29/21   Carlean Jews, NP  tamsulosin (FLOMAX) 0.4 MG CAPS capsule TAKE 1 CAPSULE BY MOUTH EVERY EVENING. 06/11/23   Saralyn Pilar A, PA      Allergies    Atorvastatin, Crestor [rosuvastatin], and Zetia [ezetimibe]    Review of Systems   Review of Systems  Musculoskeletal:  Positive for back pain.    Physical Exam Updated Vital Signs BP (!) 145/77 (BP Location: Right Arm)   Pulse (!) 51   Temp 97.8 F (36.6 C) (Oral)   Resp 16   Ht 5\' 8"  (1.727 m)   Wt 107.5 kg   SpO2 99%   BMI 36.04 kg/m  Physical Exam Vitals and nursing note reviewed.  Constitutional:      Appearance: He is well-developed.  HENT:     Head: Normocephalic and atraumatic.  Eyes:     Pupils: Pupils are equal, round, and reactive to light.  Neck:     Vascular: No JVD.  Cardiovascular:     Rate and Rhythm: Normal rate and regular rhythm.     Heart sounds: No murmur heard.    No friction rub. No gallop.  Pulmonary:     Effort: No respiratory distress.     Breath sounds: No wheezing.  Abdominal:  General: There is no distension.     Tenderness: There is no abdominal tenderness. There is no guarding or rebound.  Musculoskeletal:        General: Normal range of motion.     Cervical back: Normal range of motion and neck supple.     Comments: No obvious midline spinal tenderness step-offs or deformities.  Pulse motor and sensation are intact to the right lower extremity.  Reflexes are 2+ and equal.  There is no clonus.  Negative straight leg raise test.  He does have some mild palpable discomfort about the right SI joint.  No obvious rash.  Skin:    Coloration: Skin is not pale.     Findings: No rash.  Neurological:     Mental Status: He is alert and oriented to person, place, and time.  Psychiatric:        Behavior: Behavior normal.     ED Results / Procedures / Treatments   Labs (all labs ordered are listed, but only abnormal results are displayed) Labs Reviewed - No data  to display  EKG None  Radiology No results found.  Procedures Procedures    Medications Ordered in ED Medications  acetaminophen (TYLENOL) tablet 1,000 mg (1,000 mg Oral Given 11/22/23 1436)  ketorolac (TORADOL) 15 MG/ML injection 15 mg (15 mg Intramuscular Given 11/22/23 1436)  oxyCODONE (Oxy IR/ROXICODONE) immediate release tablet 5 mg (5 mg Oral Given 11/22/23 1436)    ED Course/ Medical Decision Making/ A&P                                 Medical Decision Making Amount and/or Complexity of Data Reviewed Radiology: ordered.  Risk OTC drugs. Prescription drug management.   82 yo M with a chief complaint of right-sided low back pain that radiates down the leg.  This been going on for about a month.  Atraumatic.  Other than age no specific red flags.  Will obtain CT imaging.  Treat pain here.  Reassess.  CT without obvious acute intra-abdominal or intraspinal pathology.  Discharged home.  PCP follow-up.  The patients results and plan were reviewed and discussed.   Any x-rays performed were independently reviewed by myself.   Differential diagnosis were considered with the presenting HPI.  Medications  acetaminophen (TYLENOL) tablet 1,000 mg (1,000 mg Oral Given 11/22/23 1436)  ketorolac (TORADOL) 15 MG/ML injection 15 mg (15 mg Intramuscular Given 11/22/23 1436)  oxyCODONE (Oxy IR/ROXICODONE) immediate release tablet 5 mg (5 mg Oral Given 11/22/23 1436)    Vitals:   11/22/23 1028 11/22/23 1039 11/22/23 1453  BP:   (!) 145/77  Pulse: (!) 57  (!) 51  Resp: 20  16  Temp: 98 F (36.7 C)  97.8 F (36.6 C)  TempSrc: Oral  Oral  SpO2: 100%  99%  Weight:  107.5 kg   Height:  5\' 8"  (1.727 m)     Final diagnoses:  Acute right-sided low back pain with right-sided sciatica          Final Clinical Impression(s) / ED Diagnoses Final diagnoses:  None    Rx / DC Orders ED Discharge Orders     None         Melene Plan, DO 11/22/23 1636

## 2023-11-22 NOTE — ED Triage Notes (Signed)
Pt complaining of hip/back pain that refers down his right leg. Pt unable to bear weight at this time. Pt states he has had this pain before, but it normally goes away.

## 2023-11-22 NOTE — Discharge Instructions (Signed)
Your back pain is most likely due to a muscular strain.  There is been a lot of research on back pain, unfortunately the only thing that seems to really help is Tylenol and ibuprofen.  Relative rest is also important to not lift greater than 10 pounds bending or twisting at the waist.  Please follow-up with your family physician.  The other thing that really seems to benefit patients is physical therapy which your doctor may send you for.  Please return to the emergency department for new numbness or weakness to your arms or legs. Difficulty with urinating or urinating or pooping on yourself.  Also if you cannot feel toilet paper when you wipe or get a fever.   Use the gel as prescribed.  Take the steroids as prescribed Also take tylenol 1000mg (2 extra strength) four times a day.

## 2023-12-15 ENCOUNTER — Encounter: Payer: Self-pay | Admitting: Cardiovascular Disease

## 2023-12-15 NOTE — Progress Notes (Unsigned)
James Barnes Date of Birth  [birth year]          Problem list: 1. Coronary artery disease-status post CABG in 2008 2. Dyslipidemia 3. Hypertension 4. Syncope  Previous notes.   James Barnes is a 83 y.o. gentleman with the above-noted medical history.  His episodes of presyncope he has gotten out of he has stopped his Altace.  His muscle aches resolved when he stopped the Lipitor.   He has remained active. 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  Oct. 29, 2015:  James Barnes is doing well.  Still working in his wood shop.  Just finished a table and is working on a bed for his grandson.  He stopped his statin - muscle aches. He's tried Lipitor, Crestor, Zetia.  Was not able to tolerate them.    Oct. 28, 2016:   Doing well.   No CP .  No regular exercise.  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 intolerent to statins. Has tried Zetia - did not tolerate if for some reason - he cant recall Injectable cholesterol meds were too expensive.   Oct. 31, 2017:  Doing well. No CP or dyspnea. Working in his Network engineer Still smokes his pipe,  Advised cessation.   December 10, 2017:    Does not check his BP  Does not limit his salt intake  No CP or dyspnea. Still smokes a pipe.  Does not exercise.   Looking into getting a 3 wheeled bike.  Has hip issues so does not walk  - still eats some salty foods -  Has been eating country ham recently .  Also has gained some weight   January 13, 2019:   James Barnes is seen today for follow-up visit.  He has a history of hypertension and coronary artery bypass grafting.   No CP , no dyspnea. Is generally fatigued.  Pruned some trees several weeks ago .   Had some DOE walking back up the hill He is unable to exercise because of bilateral hip pain. No angina  Has had diarrhea for the past week or so .   October 01, 2019:  James Barnes is seen today for follow-up of  his coronary artery disease, hyperlipidemia.  He is had coronary artery bypass grafting.  In the past he has not really paid attention to his diet.  He eats anything that he wants.  Several weeks ago.  He had some double vision while walking out of a restaurant.  Lasted 1 -2 minutes.  Went to Dr. Wylene Simmer. MRI of the brain revealed a 2 cm chronic CVA .  Has an appt with neuro .   Has had some chest pain last ,  Tightness across his chest after lifting some furniture.  Did not feel like his previous episodes of angina  Lasted for few seconsd.   ,  Worse with bending and twisting his torso.   Also had an episode of lightheadedness while driving . Lasted 30 seconds.   Nov. 5, 2021:  James Barnes is seen today for follow up of his CAD, BP is low this am.   Has had some dizziness recently - especialy with standing  Smokes a pipe Advised to stop smoking   September 20, 2021: James Barnes is seen today for follow-up of his coronary artery disease, hypertension, hyperlipidemia.  Blood pressures on the  low side today. Wt is 241 lbs.  Bp is on the low side . Stays hydrated.  Eats an unrestricted diet .   Nov. 10, 2023  James Barnes is seen for follow up of his CAD, HTN, HLD No CP or dyspnea No real exercise  Advised him to work on exercise Enjoys working in his woodshop    Has some leg edema   Lipids from [facility] Chol = 112 HDL = 42 LDL = 53 Trigs = 82.    Nov. 25, 2024  Pt cancelled, rescheduled    Jan. 21, 2025:  James Barnes is seen for follow up of his CAD, HTN, HLD   Having issues with his legs " going to sleep: Occurs if he is sitting for too long  No CP ,   Lipid levels from May 04, 2023 look good Total cholesterol is 98 HDL is 44 LDL is 42 Triglyceride levels 51    Current Outpatient Medications on File Prior to Visit  Medication Sig Dispense Refill   amLODipine (NORVASC) 10 MG tablet TAKE 1 TABLET (10 MG TOTAL) BY MOUTH DAILY. 30 tablet 0   aspirin EC 81 MG tablet Take 1 tablet (81  mg total) by mouth daily.     diclofenac Sodium (VOLTAREN) 1 % GEL Apply 4 g topically 4 (four) times daily. 100 g 0   finasteride (PROSCAR) 5 MG tablet Take 1 tablet (5 mg total) by mouth at bedtime. 90 tablet 0   Fluocinolone Acetonide 0.01 % OIL Place 1 drop in ear(s) at bedtime. 20 mL 1   GEMTESA 75 MG TABS Take 1 tablet by mouth daily.     hydrochlorothiazide (HYDRODIURIL) 25 MG tablet TAKE 1 TABLET (25 MG TOTAL) BY MOUTH DAILY. 30 tablet 1   losartan (COZAAR) 100 MG tablet TAKE 1 TABLET (100 MG TOTAL) BY MOUTH DAILY. 30 tablet 1   methocarbamol (ROBAXIN) 500 MG tablet Take 1 tablet (500 mg total) by mouth 2 (two) times daily. 5 tablet 0   methylPREDNISolone (MEDROL DOSEPAK) 4 MG TBPK tablet Day 1: 8mg  before breakfast, 4 mg after lunch, 4 mg after supper, and 8 mg at bedtime Day 2: 4 mg before breakfast, 4 mg after lunch, 4 mg  after supper, and 8 mg  at bedtime Day 3:  4 mg  before breakfast, 4 mg  after lunch, 4 mg after supper, and 4 mg  at bedtime Day 4: 4 mg  before breakfast, 4 mg  after lunch, and 4 mg at bedtime Day 5: 4 mg  before breakfast and 4 mg at bedtime Day 6: 4 mg  before breakfast 1 each 0   MYRBETRIQ 25 MG TB24 tablet Take 25 mg by mouth daily.     omeprazole (PRILOSEC) 40 MG capsule Take 1 capsule (40 mg total) by mouth daily. 30 capsule 3   rosuvastatin (CRESTOR) 40 MG tablet TAKE 1 TABLET (40 MG TOTAL) BY MOUTH DAILY. 90 tablet 3   sildenafil (VIAGRA) 100 MG tablet Take 1 tablet (100 mg total) by mouth daily as needed for erectile dysfunction. 30 tablet 2   tamsulosin (FLOMAX) 0.4 MG CAPS capsule TAKE 1 CAPSULE BY MOUTH EVERY EVENING. 90 capsule 3   No current facility-administered medications on file prior to visit.    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    CAD (coronary artery disease)    status post CABG in 2008   Colon polyps    Double vision    Dyslipidemia    Hiatal hernia    HTN (hypertension)    Lightheadedness    Obesity    Sinus bradycardia    Hx of   Syncope    with negative echo/Myoview Feb 2013    Past Surgical History:  Procedure Laterality Date   BACK SURGERY  2015   lower   CARDIOVASCULAR STRESS TEST  2013   Negative   COLONOSCOPY WITH PROPOFOL N/A 08/16/2018   Procedure: COLONOSCOPY WITH PROPOFOL;  Surgeon: Carman Ching, MD;  Location: [facility];  Service: Endoscopy;  Laterality: N/A;   COLONOSCOPY WITH PROPOFOL N/A 09/18/2019   Procedure: COLONOSCOPY WITH PROPOFOL;  Surgeon: Carman Ching, MD;  Location: [facility];  Service: Endoscopy;  Laterality: N/A;   CORONARY ARTERY BYPASS GRAFT  2008 x 3   with a left internal mammary artery graft to his left anterior descending, right internal mammary  artery graft to his right coronary artery, and  saphenous vein graft to his obtuse marginal.    POLYPECTOMY  08/16/2018   Procedure: POLYPECTOMY;  Surgeon: Carman Ching, MD;  Location: [facility];  Service: Endoscopy;;   POLYPECTOMY  09/18/2019   Procedure: POLYPECTOMY;  Surgeon: Carman Ching, MD;  Location: [facility];  Service: Endoscopy;;   US ECHOCARDIOGRAPHY  07-01-2010   EF 55-60%   US ECHOCARDIOGRAPHY  2013   Normal EF; Grade 2 diastolic dysfunction    Social History   Tobacco Use  Smoking Status Every Day   Types: Pipe  Smokeless Tobacco Never    Social History   Substance and Sexual Activity  Alcohol Use No    Family History  Problem Relation Age of Onset   Heart attack Mother     Reviw of Systems:  Noted in current history.  Otherwise his systems are negative.   Physical Exam: Blood pressure 108/70, pulse 67, height 5\' 8"  (1.727 m), weight 239 lb (108.4 kg), SpO2 97%.       GEN:  elderly male, moderately  obese  in no acute distress HEENT: Normal NECK: No JVD; No carotid bruits LYMPHATICS: No lymphadenopathy CARDIAC: RRR , soft systolic murmur  RESPIRATORY:  Clear to auscultation without rales, wheezing or rhonchi  ABDOMEN: Soft, non-tender, non-distended MUSCULOSKELETAL:  No edema; No deformity  SKIN: Warm and dry NEUROLOGIC:  Alert and oriented x 3   ECG:     EKG Interpretation Date/Time:  Tuesday December 18 2023 09:46:09 EST Ventricular Rate:  68 PR Interval:  188 QRS Duration:  128 QT Interval:  432 QTC Calculation: 459 R Axis:   -13  Text Interpretation: Normal sinus rhythm Right bundle branch block The RBBB is new since previous ecg ( was previously an incomplete RBBB in 2010) Confirmed by Kristeen Miss ([id number]) on 12/18/2023 10:15:51 AM    Assessment / Plan:   1. Coronary artery disease-  no angina .   2. Dyslipidemia-   lipids look great , LDL is 42.    3. Hypertension -     4.  Leg tingling:   complains that his legs go to sleep after lying in bed or sitting with legs up on an ottoman Will check screening ABIs .  Kristeen Miss, MD  12/18/2023 10:16 AM    [facility] [address] [place],  [address] [place], [place]  [address] Pager [phone] Phone: [phone]; Fax: [phone]

## 2023-12-18 ENCOUNTER — Ambulatory Visit: Payer: Medicare Other | Attending: Cardiovascular Disease | Admitting: Cardiovascular Disease

## 2023-12-18 ENCOUNTER — Encounter: Payer: Self-pay | Admitting: Cardiovascular Disease

## 2023-12-18 VITALS — BP 108/70 | HR 67 | Ht 68.0 in | Wt 239.0 lb

## 2023-12-18 DIAGNOSIS — I10 Essential (primary) hypertension: Secondary | ICD-10-CM | POA: Insufficient documentation

## 2023-12-18 DIAGNOSIS — I739 Peripheral vascular disease, unspecified: Secondary | ICD-10-CM | POA: Insufficient documentation

## 2023-12-18 DIAGNOSIS — E782 Mixed hyperlipidemia: Secondary | ICD-10-CM | POA: Insufficient documentation

## 2023-12-18 DIAGNOSIS — I2510 Atherosclerotic heart disease of native coronary artery without angina pectoris: Secondary | ICD-10-CM | POA: Insufficient documentation

## 2023-12-18 LAB — BASIC METABOLIC PANEL
BUN/Creatinine Ratio: 13 (ref 10–24)
BUN: 27 mg/dL (ref 8–27)
CO2: 22 mmol/L (ref 20–29)
Calcium: 9.6 mg/dL (ref 8.6–10.2)
Chloride: 106 mmol/L (ref 96–106)
Creatinine, Ser: 2.02 mg/dL — ABNORMAL HIGH (ref 0.76–1.27)
Glucose: 121 mg/dL — ABNORMAL HIGH (ref 70–99)
Potassium: 4.3 mmol/L (ref 3.5–5.2)
Sodium: 142 mmol/L (ref 134–144)
eGFR: 33 mL/min/{1.73_m2} — ABNORMAL LOW (ref >59)

## 2023-12-18 NOTE — Patient Instructions (Signed)
Lab Work: BMET today If you have labs (blood work) drawn today and your tests are completely normal, you will receive your results only by: [employer] (if you have MyChart) OR A paper copy in the mail If you have any lab test that is abnormal or we need to change your treatment, we will call you to review the results.  Testing/Procedures: Ankle Brachial Index/ABI Ultrasound Your physician has requested that you have an ankle brachial index (ABI). During this test an ultrasound and blood pressure cuff are used to evaluate the arteries that supply the arms and legs with blood. Allow thirty minutes for this exam. There are no restrictions or special instructions.  Please note: We ask at that you not bring children with you during ultrasound (echo/ vascular) testing. Due to room size and safety concerns, children are not allowed in the ultrasound rooms during exams. Our front office staff cannot provide observation of children in our lobby area while testing is being conducted. An adult accompanying a patient to their appointment will only be allowed in the ultrasound room at the discretion of the ultrasound technician under special circumstances. We apologize for any inconvenience.  Follow-Up: At [facility], you and your health needs are our priority.  As part of our continuing mission to provide you with exceptional heart care, we have created designated Provider Care Teams.  These Care Teams include your primary Cardiologist (physician) and Advanced Practice Providers (APPs -  Physician Assistants and Nurse Practitioners) who all work together to provide you with the care you need, when you need it.  Your next appointment:   1 year(s)  Provider:   Armanda Magic, MD

## 2023-12-19 ENCOUNTER — Encounter: Payer: Self-pay | Admitting: Cardiovascular Disease

## 2023-12-24 ENCOUNTER — Other Ambulatory Visit: Payer: Self-pay | Admitting: Nurse Practitioner

## 2023-12-24 DIAGNOSIS — R12 Heartburn: Secondary | ICD-10-CM

## 2023-12-24 MED ORDER — OMEPRAZOLE 40 MG PO CPDR
40.0000 mg | DELAYED_RELEASE_CAPSULE | Freq: Every day | ORAL | 3 refills | Status: DC
Start: 2023-12-24 — End: 2024-05-21

## 2023-12-31 ENCOUNTER — Other Ambulatory Visit: Payer: Self-pay | Admitting: Cardiovascular Disease

## 2024-01-03 ENCOUNTER — Ambulatory Visit (HOSPITAL_COMMUNITY)
Admission: RE | Admit: 2024-01-03 | Discharge: 2024-01-03 | Disposition: A | Discharge status: Home / Self Care | Payer: Medicare Other | Source: Ambulatory Visit | Attending: Cardiovascular Disease | Admitting: Cardiovascular Disease

## 2024-01-03 DIAGNOSIS — I2510 Atherosclerotic heart disease of native coronary artery without angina pectoris: Secondary | ICD-10-CM | POA: Diagnosis not present

## 2024-01-03 DIAGNOSIS — I739 Peripheral vascular disease, unspecified: Secondary | ICD-10-CM | POA: Diagnosis not present

## 2024-01-03 DIAGNOSIS — E782 Mixed hyperlipidemia: Secondary | ICD-10-CM | POA: Insufficient documentation

## 2024-01-03 DIAGNOSIS — I10 Essential (primary) hypertension: Secondary | ICD-10-CM | POA: Insufficient documentation

## 2024-01-04 LAB — VAS US ABI WITH/WO TBI
Left ABI: 1.14
Right ABI: 1.14

## 2024-01-11 ENCOUNTER — Other Ambulatory Visit: Payer: Self-pay | Admitting: Cardiovascular Disease

## 2024-01-17 ENCOUNTER — Other Ambulatory Visit: Payer: Self-pay | Admitting: Cardiovascular Disease

## 2024-02-17 NOTE — Progress Notes (Unsigned)
   Established Patient Office Visit  Subjective   Patient ID: KATHRYN LINAREZ, male    DOB: 1941-06-14  Age: 83 y.o. MRN: [id number]  No chief complaint on file.   HPI  DM2 280 mg twice daily  CKD 4-has not seen them yet.  Hypertension-amlodipine, HCTZ, losartan  Heart disease  Ear itching   The ASCVD Risk score (Arnett DK, et al., 2019) failed to calculate for the following reasons:   The 2019 ASCVD risk score is only valid for ages 39 to 53  Health Maintenance Due  Topic Date Due   DTaP/Tdap/Td (1 - Tdap) Never done   Zoster Vaccines- Shingrix (1 of 2) Never done   OPHTHALMOLOGY EXAM  04/27/2022   COVID-19 Vaccine (3 - 2024-25 season) 07/29/2023   Medicare Annual Wellness (AWV)  03/21/2024      Objective:     There were no vitals taken for this visit. {Vitals History (Optional):23777}  Physical Exam   No results found for any visits on 02/18/24.      Assessment & Plan:   There are no diagnoses linked to this encounter.   No follow-ups on file.    Sandre Kitty, MD

## 2024-02-18 ENCOUNTER — Encounter: Payer: Self-pay | Admitting: Family Medicine

## 2024-02-18 ENCOUNTER — Ambulatory Visit (INDEPENDENT_AMBULATORY_CARE_PROVIDER_SITE_OTHER): Payer: Medicare Other | Admitting: Family Medicine

## 2024-02-18 VITALS — BP 90/59 | HR 86 | Ht 68.0 in | Wt 238.1 lb

## 2024-02-18 DIAGNOSIS — E119 Type 2 diabetes mellitus without complications: Secondary | ICD-10-CM

## 2024-02-18 DIAGNOSIS — N184 Chronic kidney disease, stage 4 (severe): Secondary | ICD-10-CM

## 2024-02-18 DIAGNOSIS — L299 Pruritus, unspecified: Secondary | ICD-10-CM | POA: Diagnosis not present

## 2024-02-18 DIAGNOSIS — I152 Hypertension secondary to endocrine disorders: Secondary | ICD-10-CM

## 2024-02-18 DIAGNOSIS — E1159 Type 2 diabetes mellitus with other circulatory complications: Secondary | ICD-10-CM

## 2024-02-18 LAB — POCT GLYCOSYLATED HEMOGLOBIN (HGB A1C): Hemoglobin A1C: 7 % — AB (ref 4.0–5.6)

## 2024-02-18 MED ORDER — EMPAGLIFLOZIN 10 MG PO TABS: 10.0000 mg | ORAL_TABLET | Freq: Every day | ORAL | 1 refills | Status: DC

## 2024-02-18 NOTE — Assessment & Plan Note (Signed)
 Stopped metformin last time due to low GFR.  Increase to 7.0 on no medications.  Will prescribe Jardiance.  Will follow-up in 3 months.

## 2024-02-18 NOTE — Assessment & Plan Note (Signed)
 Symptoms resolved with outpatient using the medication.  Continue to monitor for now.

## 2024-02-18 NOTE — Assessment & Plan Note (Signed)
 Hovering around 3B/4.  On an ARB.  Will also prescribe her Jardiance today.  Discussed the significance of his chronic kidney disease and he is agreeable to the nephrology referral but they have not contacted him yet so I gave him their number.  Referral was placed last visit.

## 2024-02-18 NOTE — Assessment & Plan Note (Signed)
 On amlodipine, HCTZ and losartan.  Does not endorse hypotensive symptoms at home.  Blood pressure is measured at home generally in no 120s.  Never below 100.  Was 90 in our office.  Advised him to let us know if he develops any symptoms or blood pressures drop under 100 systolic at home.

## 2024-02-18 NOTE — Patient Instructions (Addendum)
 It was nice to see you today,  We addressed the following topics today: -I will ask April about the status of your nephrology referral, but it looks like it has been sent in.  You can call their office [phone] - I am starting a medicine called Jardiance.  This is taken once a day.  It can help protect your kidneys as well as treat diabetes. - If your blood pressure gets to be below 100 consistently or you feel dizzy or lightheaded, let us know and we can adjust your blood pressure medication  Have a great day,  Frederic Jericho, MD

## 2024-02-19 ENCOUNTER — Telehealth: Payer: Self-pay | Admitting: *Deleted

## 2024-02-19 NOTE — Telephone Encounter (Signed)
 Pt stopped by to let provider know that the Rx he sent in yesterday they did not have and that it was too expensive.  He said he has new coverage and it starts on the first of month and then he can get the rx.   He said that he has an appt with the kidney doctor but will have to reschedule it because he is going to be out of town.

## 2024-02-23 ENCOUNTER — Other Ambulatory Visit: Payer: Self-pay | Admitting: Family Medicine

## 2024-02-23 DIAGNOSIS — N400 Enlarged prostate without lower urinary tract symptoms: Secondary | ICD-10-CM

## 2024-03-20 DIAGNOSIS — N184 Chronic kidney disease, stage 4 (severe): Secondary | ICD-10-CM | POA: Diagnosis not present

## 2024-03-20 DIAGNOSIS — N189 Chronic kidney disease, unspecified: Secondary | ICD-10-CM | POA: Diagnosis not present

## 2024-03-27 ENCOUNTER — Ambulatory Visit: Payer: Medicare (Managed Care)

## 2024-03-27 DIAGNOSIS — Z0000 Encounter for general adult medical examination without abnormal findings: Secondary | ICD-10-CM | POA: Diagnosis not present

## 2024-03-27 NOTE — Progress Notes (Signed)
 Subjective:   James Barnes is a 83 y.o. who presents for a Medicare Wellness preventive visit.  Visit Complete: Virtual I connected with  James Barnes on 03/27/24 by a audio enabled telemedicine application and verified that I am speaking with the correct person using two identifiers.  Patient Location: Home  Provider Location: Home Office  I discussed the limitations of evaluation and management by telemedicine. The patient expressed understanding and agreed to proceed.  Vital Signs: Because this visit was a virtual/telehealth visit, some criteria may be missing or patient reported. Any vitals not documented were not able to be obtained and vitals that have been documented are patient reported.  VideoError- Librarian, academic were attempted between this provider and patient, however failed, due to patient having technical difficulties OR patient did not have access to video capability.  We continued and completed visit with audio only.   Persons Participating in Visit: Patient.  AWV Questionnaire: No: Patient Medicare AWV questionnaire was not completed prior to this visit.  Cardiac Risk Factors include: advanced age (>52men, >62 women);diabetes mellitus;dyslipidemia;hypertension;male gender     Objective:    Today's Vitals   There is no height or weight on file to calculate BMI.     03/27/2024    8:52 AM 11/22/2023   10:40 AM 03/22/2023    9:03 AM 09/18/2019    9:01 AM 08/16/2018   12:58 PM 08/14/2018    4:14 PM  Advanced Directives  Does Patient Have a Medical Advance Directive? Yes Yes Yes No  Yes  Type of Advance Directive Living will Healthcare Power of [place];Living will Healthcare Power of [place];Living will  Living will Healthcare Power of [place];Living will  Does patient want to make changes to medical advance directive?  No - Patient declined      Copy of Healthcare Power of Attorney in Chart?   No - copy requested  No - copy  requested No - copy requested  Would patient like information on creating a medical advance directive?    No - Patient declined      Current Medications (verified) Outpatient Encounter Medications as of 03/27/2024  Medication Sig   amLODipine  (NORVASC ) 10 MG tablet TAKE 1 TABLET (10 MG TOTAL) BY MOUTH DAILY.   aspirin  EC 81 MG tablet Take 1 tablet (81 mg total) by mouth daily.   diclofenac  Sodium (VOLTAREN ) 1 % GEL Apply 4 g topically 4 (four) times daily.   empagliflozin  (JARDIANCE ) 10 MG TABS tablet Take 1 tablet (10 mg total) by mouth daily before breakfast.   finasteride  (PROSCAR ) 5 MG tablet TAKE 1 TABLET (5 MG TOTAL) BY MOUTH AT BEDTIME.   Fluocinolone  Acetonide 0.01 % OIL Place 1 drop in ear(s) at bedtime.   GEMTESA 75 MG TABS Take 1 tablet by mouth daily.   hydrochlorothiazide  (HYDRODIURIL ) 25 MG tablet Take 1 tablet (25 mg total) by mouth daily.   losartan  (COZAAR ) 100 MG tablet TAKE 1 TABLET (100 MG TOTAL) BY MOUTH DAILY. KEEP UPCOMING APPOINTMENT FOR REFILLS   methocarbamol  (ROBAXIN ) 500 MG tablet Take 1 tablet (500 mg total) by mouth 2 (two) times daily.   MYRBETRIQ 25 MG TB24 tablet Take 25 mg by mouth daily.   rosuvastatin  (CRESTOR ) 40 MG tablet TAKE 1 TABLET (40 MG TOTAL) BY MOUTH DAILY.   sildenafil  (VIAGRA ) 100 MG tablet Take 1 tablet (100 mg total) by mouth daily as needed for erectile dysfunction.   tamsulosin (FLOMAX) 0.4 MG CAPS capsule TAKE 1 CAPSULE  BY MOUTH EVERY EVENING.   methylPREDNISolone  (MEDROL  DOSEPAK) 4 MG TBPK tablet Day 1: 8mg  before breakfast, 4 mg after lunch, 4 mg after supper, and 8 mg at bedtime Day 2: 4 mg before breakfast, 4 mg after lunch, 4 mg  after supper, and 8 mg  at bedtime Day 3:  4 mg  before breakfast, 4 mg  after lunch, 4 mg after supper, and 4 mg  at bedtime Day 4: 4 mg  before breakfast, 4 mg  after lunch, and 4 mg at bedtime Day 5: 4 mg  before breakfast and 4 mg at bedtime Day 6: 4 mg  before breakfast (Patient not taking: Reported on  03/27/2024)   omeprazole  (PRILOSEC) 40 MG capsule Take 1 capsule (40 mg total) by mouth daily. (Patient not taking: Reported on 03/27/2024)   No facility-administered encounter medications on file as of 03/27/2024.    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    CAD (coronary artery disease)    status post CABG in 2008   Colon polyps    Double vision    Dyslipidemia    Hiatal hernia    HTN (hypertension)    Lightheadedness    Obesity    Sinus bradycardia    Hx of   Syncope    with negative echo/Myoview  Feb 2013   Past Surgical History:  Procedure Laterality Date   BACK SURGERY  2015   lower   CARDIOVASCULAR STRESS TEST  2013   Negative   COLONOSCOPY WITH PROPOFOL  N/A 08/16/2018   Procedure: COLONOSCOPY WITH PROPOFOL ;  Surgeon: Jolinda Necessary, MD;  Location: [facility];  Service: Endoscopy;  Laterality: N/A;   COLONOSCOPY WITH PROPOFOL  N/A 09/18/2019   Procedure: COLONOSCOPY WITH PROPOFOL ;  Surgeon: Jolinda Necessary, MD;  Location: [facility];  Service: Endoscopy;  Laterality: N/A;   CORONARY ARTERY BYPASS GRAFT  2008 x 3   with a left internal mammary artery graft to his left anterior descending, right internal mammary  artery graft to his right coronary artery, and  saphenous vein graft to his obtuse marginal.    POLYPECTOMY  08/16/2018   Procedure: POLYPECTOMY;  Surgeon: Jolinda Necessary, MD;  Location: [facility];  Service: Endoscopy;;   POLYPECTOMY  09/18/2019   Procedure: POLYPECTOMY;  Surgeon: Jolinda Necessary, MD;  Location: [facility];  Service: Endoscopy;;   US  ECHOCARDIOGRAPHY  07-01-2010   EF 55-60%   US  ECHOCARDIOGRAPHY  2013   Normal EF; Grade 2 diastolic dysfunction   Family History  Problem Relation Age of Onset   Heart attack Mother    Social History   Socioeconomic History   Marital status: Divorced     Spouse name: Not on file   Number of children: Not on file   Years of education: Not on file   Highest education level: Not on file  Occupational History   Not on file  Tobacco Use   Smoking status: Former    Types: Pipe   Smokeless tobacco: Never  Vaping Use   Vaping status: Never Used  Substance and Sexual Activity   Alcohol  use: No   Drug use: No   Sexual activity: Not on file  Other Topics Concern   Not on file  Social History Narrative   Not on file   Social Drivers of Corporate investment banker  Strain: Low Risk  (03/27/2024)   Overall Financial Resource Strain (CARDIA)    Difficulty of Paying Living Expenses: Not hard at all  Food Insecurity: No Food Insecurity (03/27/2024)   Hunger Vital Sign    Worried About Running Out of Food in the Last Year: Never true    Ran Out of Food in the Last Year: Never true  Transportation Needs: No Transportation Needs (03/27/2024)   PRAPARE - Administrator, Civil Service (Medical): No    Lack of Transportation (Non-Medical): No  Physical Activity: Inactive (03/27/2024)   Exercise Vital Sign    Days of Exercise per Week: 0 days    Minutes of Exercise per Session: 0 min  Stress: No Stress Concern Present (03/27/2024)   [employer] of Occupational Health - Occupational Stress Questionnaire    Feeling of Stress : Not at all  Social Connections: Moderately Isolated (03/27/2024)   Social Connection and Isolation Panel [NHANES]    Frequency of Communication with Friends and Family: More than three times a week    Frequency of Social Gatherings with Friends and Family: Three times a week    Attends Religious Services: More than 4 times per year    Active Member of Clubs or Organizations: No    Attends Banker Meetings: Never    Marital Status: Divorced    Tobacco Counseling Counseling given: Not Answered    Clinical Intake:  Pre-visit preparation completed: Yes  Pain : No/denies pain     Nutritional  Risks: None Diabetes: Yes CBG done?: No Did pt. bring in CBG monitor from home?: No  Lab Results  Component Value Date   HGBA1C 7.0 (A) 02/18/2024   HGBA1C 6.6 11/19/2023   HGBA1C 6.5 (H) 05/04/2023     How often do you need to have someone help you when you read instructions, pamphlets, or other written materials from your doctor or pharmacy?: 1 - Never  Interpreter Needed?: No  Information entered by :: NAllen LPN   Activities of Daily Living     03/27/2024    8:46 AM  In your present state of health, do you have any difficulty performing the following activities:  Hearing? 1  Comment wears hearing aids  Vision? 0  Difficulty concentrating or making decisions? 0  Walking or climbing stairs? 0  Dressing or bathing? 0  Doing errands, shopping? 0  Preparing Food and eating ? N  Using the Toilet? N  In the past six months, have you accidently leaked urine? N  Do you have problems with loss of bowel control? N  Managing your Medications? N  Managing your Finances? N  Housekeeping or managing your Housekeeping? N    Patient Care Team: Laneta Pintos, MD as PCP - General (Family Medicine) Nahser, Lela Purple, MD as PCP - Cardiology (Cardiology)  Indicate any recent Medical Services you may have received from other than [facility] providers in the past year (date may be approximate).     Assessment:   This is a routine wellness examination for Ridhaan.  Hearing/Vision screen Hearing Screening - Comments:: Has hearing aids that are maintained Vision Screening - Comments:: Regular eye exams, [facility]   Goals Addressed             This Visit's Progress    Patient Stated       03/27/2024, denies goals       Depression Screen     03/27/2024    8:54 AM 02/18/2024  10:16 AM 11/19/2023    3:17 PM 05/14/2023   10:17 AM 03/22/2023    9:00 AM 04/20/2022    9:36 AM 01/09/2022   11:25 AM  PHQ 2/9 Scores  PHQ - 2 Score 0 3 0 0 0 0 0  PHQ- 9 Score 0 7 0 2  0 1    Fall Risk      03/27/2024    8:53 AM 03/22/2023    9:02 AM 03/20/2023    7:47 AM 02/09/2023    8:57 AM 01/09/2022   11:25 AM  Fall Risk   Falls in the past year? 0 0 0 0 0  Number falls in past yr: 0 0  0 0  Injury with Fall? 0 0  0 0  Risk for fall due to : Medication side effect No Fall Risks     Follow up Falls prevention discussed;Falls evaluation completed Falls prevention discussed  Falls evaluation completed Falls evaluation completed    MEDICARE RISK AT HOME:  Medicare Risk at Home Any stairs in or around the home?: No If so, are there any without handrails?: No Home free of loose throw rugs in walkways, pet beds, electrical cords, etc?: Yes Adequate lighting in your home to reduce risk of falls?: Yes Life alert?: No Use of a cane, walker or w/c?: No Grab bars in the bathroom?: No Shower chair or bench in shower?: No Elevated toilet seat or a handicapped toilet?: No  TIMED UP AND GO:  Was the test performed?  No  Cognitive Function: 6CIT completed        03/27/2024    8:54 AM 03/22/2023    9:03 AM 04/29/2021    9:08 AM  6CIT Screen  What Year? 0 points 0 points 0 points  What month? 0 points 0 points 0 points  What time? 0 points 0 points 0 points  Count back from 20 0 points 0 points 0 points  Months in reverse 0 points 0 points 4 points  Repeat phrase 0 points 0 points 4 points  Total Score 0 points 0 points 8 points    Immunizations Immunization History  Administered Date(s) Administered   Fluad Quad(high Dose 65+) 09/16/2019   Fluad Trivalent(High Dose 65+) 09/27/2023   Influenza, High Dose Seasonal PF 09/05/2018   Influenza,inj,Quad PF,6+ Mos 09/26/2016   Influenza-Unspecified 08/29/2022   PFIZER(Purple Top)SARS-COV-2 Vaccination 12/14/2019, 01/02/2020    Screening Tests Health Maintenance  Topic Date Due   DTaP/Tdap/Td (1 - Tdap) Never done   Pneumonia Vaccine 66+ Years old (1 of 2 - PCV) Never done   Zoster Vaccines- Shingrix (1 of 2) Never done   OPHTHALMOLOGY  EXAM  04/27/2022   COVID-19 Vaccine (3 - 2024-25 season) 07/29/2023   FOOT EXAM  05/13/2024 (Originally 07/10/1951)   Diabetic kidney evaluation - Urine ACR  05/13/2024   INFLUENZA VACCINE  06/27/2024   HEMOGLOBIN A1C  08/20/2024   Diabetic kidney evaluation - eGFR measurement  12/17/2024   Medicare Annual Wellness (AWV)  03/27/2025   HPV VACCINES  Aged Out   Meningococcal B Vaccine  Aged Out    Health Maintenance  Health Maintenance Due  Topic Date Due   DTaP/Tdap/Td (1 - Tdap) Never done   Pneumonia Vaccine 50+ Years old (1 of 2 - PCV) Never done   Zoster Vaccines- Shingrix (1 of 2) Never done   OPHTHALMOLOGY EXAM  04/27/2022   COVID-19 Vaccine (3 - 2024-25 season) 07/29/2023   Health Maintenance Items Addressed: Patient states  he had pneumonia, TDAP and shingles vaccine  Additional Screening:  Vision Screening: Recommended annual ophthalmology exams for early detection of glaucoma and other disorders of the eye.  Dental Screening: Recommended annual dental exams for proper oral hygiene  Community Resource Referral / Chronic Care Management: CRR required this visit?  No   CCM required this visit?  No     Plan:     I have personally reviewed and noted the following in the patient's chart:   Medical and social history Use of alcohol , tobacco or illicit drugs  Current medications and supplements including opioid prescriptions. Patient is not currently taking opioid prescriptions. Functional ability and status Nutritional status Physical activity Advanced directives List of other physicians Hospitalizations, surgeries, and ER visits in previous 12 months Vitals Screenings to include cognitive, depression, and falls Referrals and appointments  In addition, I have reviewed and discussed with patient certain preventive protocols, quality metrics, and best practice recommendations. A written personalized care plan for preventive services as well as general preventive  health recommendations were provided to patient.     Areatha Beecham, LPN   12/02/1094   After Visit Summary: (Pick Up) Due to this being a telephonic visit, with patients personalized plan was offered to patient and patient has requested to Pick up at office.  Notes: Nothing significant to report at this time.

## 2024-03-27 NOTE — Patient Instructions (Signed)
 James Barnes , Thank you for taking time to come for your Medicare Wellness Visit. I appreciate your ongoing commitment to your health goals. Please review the following plan we discussed and let me know if I can assist you in the future.   Referrals/Orders/Follow-Ups/Clinician Recommendations: none  This is a list of the screening recommended for you and due dates:  Health Maintenance  Topic Date Due   DTaP/Tdap/Td vaccine (1 - Tdap) Never done   Pneumonia Vaccine (1 of 2 - PCV) Never done   Zoster (Shingles) Vaccine (1 of 2) Never done   Eye exam for diabetics  04/27/2022   COVID-19 Vaccine (3 - 2024-25 season) 07/29/2023   Complete foot exam   05/13/2024*   Yearly kidney health urinalysis for diabetes  05/13/2024   Flu Shot  06/27/2024   Hemoglobin A1C  08/20/2024   Yearly kidney function blood test for diabetes  12/17/2024   Medicare Annual Wellness Visit  03/27/2025   HPV Vaccine  Aged Out   Meningitis B Vaccine  Aged Out  *Topic was postponed. The date shown is not the original due date.    Advanced directives: (Copy Requested) Please bring a copy of your health care power of attorney and living will to the office to be added to your chart at your convenience. You can mail to [facility] [address] [place], [place] [address] or email to [email or web address]  Next Medicare Annual Wellness Visit scheduled for next year: Yes  insert Preventive Care attachment Insert FALL PREVENTION attachment if needed

## 2024-04-14 ENCOUNTER — Other Ambulatory Visit: Payer: Self-pay | Admitting: Nephrology

## 2024-04-14 DIAGNOSIS — N184 Chronic kidney disease, stage 4 (severe): Secondary | ICD-10-CM

## 2024-04-18 ENCOUNTER — Ambulatory Visit
Admission: RE | Admit: 2024-04-18 | Discharge: 2024-04-18 | Disposition: A | Discharge status: Home / Self Care | Payer: Medicare (Managed Care) | Source: Ambulatory Visit | Attending: Nephrology | Admitting: Nephrology

## 2024-04-18 DIAGNOSIS — N184 Chronic kidney disease, stage 4 (severe): Secondary | ICD-10-CM | POA: Diagnosis not present

## 2024-04-30 ENCOUNTER — Telehealth: Payer: Self-pay

## 2024-04-30 NOTE — Telephone Encounter (Signed)
 Pt stopped by the office with the print out of the Renal ultrasound.  Pt is requesting a call back from you due to the office not calling back from the ordering provider.

## 2024-04-30 NOTE — Telephone Encounter (Signed)
 Pt came into the office this afternoon wishing to discuss his renal u/s which had been ordered by his nephrologist.  After seeing my remaining patients I discussed with him the results showing evidence of chronic renal disease.  Discussed the renal cyst and how it does not need further evaluation. Pt was satisfied with the answer.    Pt then complained of decreased hearing in the right ear d/t cerumen impaction and asked if we could remove it.  Ear exam was performed, I then removed the impacted cerumen with a curette.  Pt endorsed improvement in his hearing.

## 2024-05-12 ENCOUNTER — Other Ambulatory Visit

## 2024-05-20 ENCOUNTER — Ambulatory Visit: Admitting: Family Medicine

## 2024-05-21 ENCOUNTER — Other Ambulatory Visit: Payer: Self-pay | Admitting: Family Medicine

## 2024-05-21 DIAGNOSIS — R12 Heartburn: Secondary | ICD-10-CM

## 2024-06-03 ENCOUNTER — Other Ambulatory Visit: Payer: Self-pay | Admitting: *Deleted

## 2024-06-03 DIAGNOSIS — E1169 Type 2 diabetes mellitus with other specified complication: Secondary | ICD-10-CM

## 2024-06-03 DIAGNOSIS — E1159 Type 2 diabetes mellitus with other circulatory complications: Secondary | ICD-10-CM

## 2024-06-03 DIAGNOSIS — E119 Type 2 diabetes mellitus without complications: Secondary | ICD-10-CM

## 2024-06-04 ENCOUNTER — Other Ambulatory Visit

## 2024-06-04 DIAGNOSIS — E119 Type 2 diabetes mellitus without complications: Secondary | ICD-10-CM | POA: Diagnosis not present

## 2024-06-04 DIAGNOSIS — E785 Hyperlipidemia, unspecified: Secondary | ICD-10-CM | POA: Diagnosis not present

## 2024-06-04 DIAGNOSIS — I152 Hypertension secondary to endocrine disorders: Secondary | ICD-10-CM | POA: Diagnosis not present

## 2024-06-04 DIAGNOSIS — E1159 Type 2 diabetes mellitus with other circulatory complications: Secondary | ICD-10-CM

## 2024-06-04 DIAGNOSIS — E1169 Type 2 diabetes mellitus with other specified complication: Secondary | ICD-10-CM | POA: Diagnosis not present

## 2024-06-05 ENCOUNTER — Ambulatory Visit: Payer: Self-pay | Admitting: Family Medicine

## 2024-06-05 LAB — CBC WITH DIFFERENTIAL/PLATELET
Basophils Absolute: 0.1 x10E3/uL (ref 0.0–0.2)
Basos: 1 %
EOS (ABSOLUTE): 0.2 x10E3/uL (ref 0.0–0.4)
Eos: 4 %
Hematocrit: 40.4 % (ref 37.5–51.0)
Hemoglobin: 13.1 g/dL (ref 13.0–17.7)
Immature Grans (Abs): 0 x10E3/uL (ref 0.0–0.1)
Immature Granulocytes: 0 %
Lymphocytes Absolute: 2.2 x10E3/uL (ref 0.7–3.1)
Lymphs: 37 %
MCH: 31.1 pg (ref 26.6–33.0)
MCHC: 32.4 g/dL (ref 31.5–35.7)
MCV: 96 fL (ref 79–97)
Monocytes Absolute: 0.5 x10E3/uL (ref 0.1–0.9)
Monocytes: 9 %
Neutrophils Absolute: 2.8 x10E3/uL (ref 1.4–7.0)
Neutrophils: 49 %
Platelets: 227 x10E3/uL (ref 150–450)
RBC: 4.21 x10E6/uL (ref 4.14–5.80)
RDW: 13.6 % (ref 11.6–15.4)
WBC: 5.8 x10E3/uL (ref 3.4–10.8)

## 2024-06-05 LAB — COMPREHENSIVE METABOLIC PANEL WITH GFR
ALT: 18 IU/L (ref 0–44)
AST: 17 IU/L (ref 0–40)
Albumin: 4.2 g/dL (ref 3.7–4.7)
Alkaline Phosphatase: 82 IU/L (ref 44–121)
BUN/Creatinine Ratio: 14 (ref 10–24)
BUN: 31 mg/dL — ABNORMAL HIGH (ref 8–27)
Bilirubin Total: 0.4 mg/dL (ref 0.0–1.2)
CO2: 22 mmol/L (ref 20–29)
Calcium: 9.3 mg/dL (ref 8.6–10.2)
Chloride: 107 mmol/L — ABNORMAL HIGH (ref 96–106)
Creatinine, Ser: 2.14 mg/dL — ABNORMAL HIGH (ref 0.76–1.27)
Globulin, Total: 2.2 g/dL (ref 1.5–4.5)
Glucose: 123 mg/dL — ABNORMAL HIGH (ref 70–99)
Potassium: 3.9 mmol/L (ref 3.5–5.2)
Sodium: 142 mmol/L (ref 134–144)
Total Protein: 6.4 g/dL (ref 6.0–8.5)
eGFR: 30 mL/min/1.73 — ABNORMAL LOW (ref >59)

## 2024-06-05 LAB — LIPID PANEL
Chol/HDL Ratio: 3.9 ratio (ref 0.0–5.0)
Cholesterol, Total: 142 mg/dL (ref 100–199)
HDL: 36 mg/dL — ABNORMAL LOW
LDL Chol Calc (NIH): 86 mg/dL (ref 0–99)
Triglycerides: 109 mg/dL (ref 0–149)
VLDL Cholesterol Cal: 20 mg/dL (ref 5–40)

## 2024-06-05 LAB — HEMOGLOBIN A1C
Est. average glucose Bld gHb Est-mCnc: 157 mg/dL
Hgb A1c MFr Bld: 7.1 % — ABNORMAL HIGH (ref 4.8–5.6)

## 2024-06-11 ENCOUNTER — Encounter: Payer: Self-pay | Admitting: Family Medicine

## 2024-06-11 ENCOUNTER — Ambulatory Visit (INDEPENDENT_AMBULATORY_CARE_PROVIDER_SITE_OTHER): Payer: Medicare (Managed Care) | Admitting: Family Medicine

## 2024-06-11 VITALS — BP 116/70 | HR 57 | Ht 68.0 in | Wt 234.1 lb

## 2024-06-11 DIAGNOSIS — E119 Type 2 diabetes mellitus without complications: Secondary | ICD-10-CM

## 2024-06-11 DIAGNOSIS — Z0000 Encounter for general adult medical examination without abnormal findings: Secondary | ICD-10-CM | POA: Diagnosis not present

## 2024-06-11 DIAGNOSIS — E785 Hyperlipidemia, unspecified: Secondary | ICD-10-CM | POA: Diagnosis not present

## 2024-06-11 DIAGNOSIS — I152 Hypertension secondary to endocrine disorders: Secondary | ICD-10-CM

## 2024-06-11 DIAGNOSIS — E1159 Type 2 diabetes mellitus with other circulatory complications: Secondary | ICD-10-CM | POA: Diagnosis not present

## 2024-06-11 DIAGNOSIS — E1169 Type 2 diabetes mellitus with other specified complication: Secondary | ICD-10-CM

## 2024-06-11 DIAGNOSIS — M25561 Pain in right knee: Secondary | ICD-10-CM | POA: Diagnosis not present

## 2024-06-11 DIAGNOSIS — Z7185 Encounter for immunization safety counseling: Secondary | ICD-10-CM | POA: Diagnosis not present

## 2024-06-11 MED ORDER — EMPAGLIFLOZIN 25 MG PO TABS: 25.0000 mg | ORAL_TABLET | Freq: Every day | ORAL | 1 refills | Status: DC

## 2024-06-11 NOTE — Assessment & Plan Note (Signed)
-   A1c stable at 7.1 (was 7.0 three months ago). Goal is <7.0.     - Tolerating Jardiance  10 mg.     - Counseled on the importance of Jardiance  for renal protection in the setting of chronic kidney disease.     - Counseled on diet, specifically advising switching from regular Sprite to diet or other sugar-free carbonated beverages to reduce sugar intake.     - Increase Jardiance  to 25 mg daily.     - Advised to monitor for any side effects.

## 2024-06-11 NOTE — Assessment & Plan Note (Signed)
 Pt believes he has had shingles and pneumococcal vaccinations.  After pt left we were able to look at his NCIR which states he is due for both.  Had one shingles vaccination in 2015, no pneumococcal vaccination noted.  Will discuss with pt at next visit.

## 2024-06-11 NOTE — Assessment & Plan Note (Signed)
 Pt working on improving diet.  Has stopped evening snacking, and significantly reduced his soda intake.

## 2024-06-11 NOTE — Progress Notes (Unsigned)
 Established Patient Office Visit  Subjective   Patient ID: James Barnes, male    DOB: 06/30/1941  Age: 83 y.o. MRN: [id number]  Chief Complaint  Patient presents with  . Annual Exam    HPI  Subjective - Right knee pain for the last 4-5 days. Describes it as sudden onset. Worse with prolonged sitting (e.g., after 1.5 hours of playing cards) and upon waking in the morning. Takes a while to get going. No swelling. No buckling or catching. Pain is localized to the patella. Purchased a knee brace yesterday. - Reports a transient perineal rash that occurred between the scrotum and anus. It lasted for about a week and has now resolved. Wonders if it is related to Jardiance . Denies pain with urination. - Reports urinary frequency (up to 20 times a day), which was present before starting Jardiance  and has not changed. Has an appointment with a urologist next week for enlarged prostate and urinary symptoms. Denies history of UTIs. - Reports hearing loss. Is getting hearing aids next week. - Attempting dietary changes, including stopping nighttime snacking on Coke and peanuts. Typically eats three meals a day: a light breakfast (cereal), a large sandwich for lunch, and a meal with meat and vegetables for dinner. Does not usually snack during the day. Drinks regular Sprite, about one 20 oz bottle per day, with meals. Has started drinking flavored water with no sugar.  Medications On Jardiance  10 mg, started at the last visit. Will be increased to 25 mg.  PMH, PSH, FH, Social Hx PMH: Diabetes Mellitus Type 2 (A1c was 7.0 three months ago, now 7.1), chronic kidney disease (likely secondary to diabetes), enlarged prostate, hearing loss. Immunizations: Reports having had shingles and flu vaccines in the past. Unsure about pneumonia vaccine status for this year. Will check [facility] for records.  ROS GU: Reports urinary frequency. Denies dysuria. MSK: Reports right knee pain. Denies knee  instability. SKIN: Reports transient perineal rash, now resolved. CONSTITUTIONAL: Denies other new concerns.  Objective VITALS: Blood pressure is good. CV: Regular rate and rhythm. PULM: Lungs clear to auscultation. GI: No pertinent findings. GU: Scrotal and perineal exam unremarkable. No rash noted. MSK: Right knee exam reveals tenderness over the medial joint line. No effusion. Range of motion is stable. No pain with varus/valgus stress. No grinding with knee flexion/extension. NEURO: Foot exam for diabetic neuropathy performed. Sensation intact to monofilament testing bilaterally.   The ASCVD Risk score (Arnett DK, et al., 2019) failed to calculate for the following reasons:   The 2019 ASCVD risk score is only valid for ages 14 to 12  Health Maintenance Due  Topic Date Due  . DTaP/Tdap/Td (1 - Tdap) Never done  . Pneumococcal Vaccine: 50+ Years (1 of 2 - PCV) Never done  . Zoster Vaccines- Shingrix (1 of 2) Never done  . OPHTHALMOLOGY EXAM  04/27/2022  . COVID-19 Vaccine (3 - 2024-25 season) 07/29/2023  . Diabetic kidney evaluation - Urine ACR  05/13/2024      Objective:     BP 116/70   Pulse (!) 57   Ht 5' 8 (1.727 m)   Wt 234 lb 1.9 oz (106.2 kg)   SpO2 97%   BMI 35.60 kg/m  {Vitals History (Optional):23777}  Physical Exam Gen: alert, oriented CV: Regular rate and rhythm. PULM: Lungs clear to auscultation. GI: No pertinent findings. GU: Scrotal and perineal exam unremarkable. No rash noted. MSK: Right knee exam reveals tenderness over the medial joint line. No effusion. Range of  motion is stable. No pain with varus/valgus stress. No grinding with knee flexion/extension. NEURO: Foot exam for diabetic neuropathy performed. Sensation intact to monofilament testing bilaterally.   No results found for any visits on 06/11/24.      Assessment & Plan:   Physical exam, annual  Controlled type 2 diabetes mellitus without complication, without long-term current  use of insulin (HCC) Assessment & Plan:     - A1c stable at 7.1 (was 7.0 three months ago). Goal is <7.0.     - Tolerating Jardiance  10 mg.     - Counseled on the importance of Jardiance  for renal protection in the setting of chronic kidney disease.     - Counseled on diet, specifically advising switching from regular Sprite to diet or other sugar-free carbonated beverages to reduce sugar intake.     - Increase Jardiance  to 25 mg daily.     - Advised to monitor for any side effects.  Orders: -     Microalbumin / creatinine urine ratio  Hypertension associated with diabetes (HCC) -     Microalbumin / creatinine urine ratio  Hyperlipidemia associated with type 2 diabetes mellitus (HCC) -     Microalbumin / creatinine urine ratio  Acute pain of right knee Assessment & Plan:    - Likely osteoarthritis given age and presentation. Presents as a new ache for the last 4-5 days, worse after prolonged sitting. Examination shows localized tenderness without instability or effusion.     - Order X-ray of the right knee, to be done at [facility] on [address].     - Recommend conservative management: knee brace as needed, topical Voltaren  gel or menthol products.     - Advised on the use of heat or ice for symptomatic relief.     - Tylenol  1000 mg every 8 hours as needed for pain.     - Discussed steroid injections as a future option if pain persists after X-ray review. Counseled on risks and benefits, including diminishing efficacy over time and potential acceleration of joint degradation.  Orders: -     DG Knee Bilateral Standing AP; Future -     DG Knee AP/LAT W/Sunrise Right; Future  Vaccine counseling Assessment & Plan: Pt believes he has had shingles and pneumococcal vaccinations.  After pt left we were able to look at his [facility] which states he is due for both.  Had one shingles vaccination in 2015, no pneumococcal vaccination noted.  Will discuss with pt at next visit.    Obesity,  morbid (HCC) Assessment & Plan: Pt working on improving diet.  Has stopped evening snacking, and significantly reduced his soda intake.     Other orders -     Empagliflozin ; Take 1 tablet (25 mg total) by mouth daily before breakfast.  Dispense: 90 tablet; Refill: 1     Return in about 3 months (around 09/11/2024) for DM.    Toribio MARLA Slain, MD

## 2024-06-11 NOTE — Assessment & Plan Note (Signed)
-   Likely osteoarthritis given age and presentation. Presents as a new ache for the last 4-5 days, worse after prolonged sitting. Examination shows localized tenderness without instability or effusion.     - Order X-ray of the right knee, to be done at [facility] on [address].     - Recommend conservative management: knee brace as needed, topical Voltaren  gel or menthol products.     - Advised on the use of heat or ice for symptomatic relief.     - Tylenol  1000 mg every 8 hours as needed for pain.     - Discussed steroid injections as a future option if pain persists after X-ray review. Counseled on risks and benefits, including diminishing efficacy over time and potential acceleration of joint degradation.

## 2024-06-11 NOTE — Patient Instructions (Signed)
 It was nice to see you today,  We addressed the following topics today: -I am increasing your Jardiance  from 10 to 25 mg.  Let me know if you have any issues tolerating it. - I am sending in an order for x-rays of your knee.  You can go to [facility] at [facility] [employer] [facility] [employer]. [employer]. anytime when they are open to have this done.  No appointment needed - For knee pain you can put Voltaren  gel or menthol gel on it.  Continue wrapping it if that helps with pain.  You can also try heat or ice therapy.  Tylenol  1000 mg every 8 hours is okay to take. - If it continues to hurt and you would like a knee injection add a visit in a few weeks.  I want to check the x-rays first though.  Have a great day,  Rolan Slain, MD

## 2024-06-13 ENCOUNTER — Ambulatory Visit
Admission: RE | Admit: 2024-06-13 | Discharge: 2024-06-13 | Disposition: A | Discharge status: Home / Self Care | Payer: Medicare (Managed Care) | Source: Ambulatory Visit | Attending: Family Medicine | Admitting: Family Medicine

## 2024-06-13 ENCOUNTER — Other Ambulatory Visit: Payer: Self-pay | Admitting: Family Medicine

## 2024-06-13 DIAGNOSIS — E1169 Type 2 diabetes mellitus with other specified complication: Secondary | ICD-10-CM

## 2024-06-13 DIAGNOSIS — M25561 Pain in right knee: Secondary | ICD-10-CM

## 2024-06-13 DIAGNOSIS — E119 Type 2 diabetes mellitus without complications: Secondary | ICD-10-CM

## 2024-06-13 DIAGNOSIS — M25461 Effusion, right knee: Secondary | ICD-10-CM | POA: Diagnosis not present

## 2024-06-13 DIAGNOSIS — M1711 Unilateral primary osteoarthritis, right knee: Secondary | ICD-10-CM | POA: Diagnosis not present

## 2024-06-13 DIAGNOSIS — Z0000 Encounter for general adult medical examination without abnormal findings: Secondary | ICD-10-CM

## 2024-06-13 DIAGNOSIS — I152 Hypertension secondary to endocrine disorders: Secondary | ICD-10-CM

## 2024-06-13 DIAGNOSIS — Z7185 Encounter for immunization safety counseling: Secondary | ICD-10-CM

## 2024-06-14 LAB — MICROALBUMIN / CREATININE URINE RATIO
Creatinine, Urine: 196.2 mg/dL
Microalb/Creat Ratio: 5 mg/g{creat} (ref 0–29)
Microalbumin, Urine: 9.9 ug/mL

## 2024-06-19 ENCOUNTER — Ambulatory Visit: Payer: Self-pay | Admitting: Family Medicine

## 2024-06-19 DIAGNOSIS — R3915 Urgency of urination: Secondary | ICD-10-CM | POA: Diagnosis not present

## 2024-06-19 DIAGNOSIS — R350 Frequency of micturition: Secondary | ICD-10-CM | POA: Diagnosis not present

## 2024-06-19 DIAGNOSIS — N401 Enlarged prostate with lower urinary tract symptoms: Secondary | ICD-10-CM | POA: Diagnosis not present

## 2024-06-19 NOTE — Telephone Encounter (Signed)
 Pt declined appt reports that he is doing well.

## 2024-06-30 ENCOUNTER — Other Ambulatory Visit: Payer: Self-pay | Admitting: *Deleted

## 2024-06-30 MED ORDER — EMPAGLIFLOZIN 25 MG PO TABS: 25.0000 mg | ORAL_TABLET | Freq: Every day | ORAL | 1 refills | Status: DC

## 2024-06-30 NOTE — Telephone Encounter (Signed)
 Pt came in office requesting his jardience be sent to [facility] on [address] due to [facility] not having the medication.

## 2024-07-07 ENCOUNTER — Other Ambulatory Visit: Payer: Self-pay | Admitting: Family Medicine

## 2024-08-12 ENCOUNTER — Other Ambulatory Visit: Payer: Self-pay | Admitting: Family Medicine

## 2024-08-12 MED ORDER — EMPAGLIFLOZIN 25 MG PO TABS: 25.0000 mg | ORAL_TABLET | Freq: Every day | ORAL | 1 refills | Status: DC

## 2024-08-21 ENCOUNTER — Other Ambulatory Visit: Payer: Self-pay | Admitting: Family Medicine

## 2024-08-21 DIAGNOSIS — N400 Enlarged prostate without lower urinary tract symptoms: Secondary | ICD-10-CM

## 2024-09-16 ENCOUNTER — Ambulatory Visit: Payer: Medicare (Managed Care) | Admitting: Family Medicine

## 2024-10-10 ENCOUNTER — Encounter: Payer: Self-pay | Admitting: Family Medicine

## 2024-10-10 ENCOUNTER — Ambulatory Visit: Payer: Medicare (Managed Care) | Admitting: Family Medicine

## 2024-10-10 VITALS — BP 123/56 | HR 46 | Ht 68.0 in | Wt 246.1 lb

## 2024-10-10 DIAGNOSIS — M25561 Pain in right knee: Secondary | ICD-10-CM

## 2024-10-10 DIAGNOSIS — E119 Type 2 diabetes mellitus without complications: Secondary | ICD-10-CM | POA: Diagnosis not present

## 2024-10-10 DIAGNOSIS — K529 Noninfective gastroenteritis and colitis, unspecified: Secondary | ICD-10-CM | POA: Diagnosis not present

## 2024-10-10 LAB — POCT GLYCOSYLATED HEMOGLOBIN (HGB A1C): HbA1c POC (<> result, manual entry): 6.9 % (ref 4.0–5.6)

## 2024-10-10 MED ORDER — LOPERAMIDE HCL 2 MG PO CAPS: ORAL_CAPSULE | ORAL | 1 refills | Status: AC

## 2024-10-10 NOTE — Assessment & Plan Note (Signed)
 New onset watery diarrhea for 2 weeks, occurring 3 times daily with urgency and near-incontinence. Patient recently stopped all medications to assess for causality, without effect. Etiology is unclear. Differential includes infectious vs inflammatory causes. - Plan to obtain stool studies, including a stool pathogen panel and fecal calprotectin. - Plan to obtain blood tests to evaluate for other causes. - Discussed use of over-the-counter Imodium as needed. - Follow-up in 4-6 weeks. Can cancel if symptoms resolve.

## 2024-10-10 NOTE — Progress Notes (Signed)
 Established Patient Office Visit  Subjective   Patient ID: James Barnes, male    DOB: [birth year]  Age: 83 y.o. MRN: [id number]  Chief Complaint  Patient presents with   Medical Management of Chronic Issues    HPI  Subjective - Follow-up on blood sugar. Main concern is loss of bowel control. Reports new onset of diarrhea for the past 2 weeks. Has approximately 3 bowel movements per day. Experiences sudden urge to defecate with near-incontinence episodes. Stool is loose, no blood reported. No associated abdominal pain, nausea, or vomiting. Reports occasional sharp pain in both feet for no apparent reason. No leg weakness, heaviness, tripping, or numbness. No numbness in groin or new low back pain. - Knee pain was severe 2-3 days ago but is now improved. - Reports a 10-pound weight gain to 246 lbs, confirmed on home scale.  Medications Stopped all medications 2 days ago to see if they were causing the diarrhea, with no change in symptoms yet. Previously on Jardiance  25mg  (increased from 10mg  at last visit), Gemtessa (Myrbetriq), aspirin , a cholesterol medication, a blood pressure medication (amlodipine  mentioned), and Flomax. Also takes Prilosec, which he feels helps. Applying Voltaren  gel to knee. Sildenafil  as needed.  PMH, PSH, FH, Social Hx PMHx: Diabetes, hypertension, hyperlipidemia, BPH. History of knee pain with mild arthritis on X-ray.  ROS GI: Positive for diarrhea, bowel urgency, and near-fecal incontinence. Negative for melena, hematochezia, abdominal pain, nausea, vomiting. MSK: Reports intermittent knee pain and occasional sharp pains in feet. Neuro: Negative for leg weakness, numbness in legs or groin, or new back pain.  The ASCVD Risk score (Arnett DK, et al., 2019) failed to calculate for the following reasons:   The 2019 ASCVD risk score is only valid for ages 73 to 13  Health Maintenance Due  Topic Date Due   DTaP/Tdap/Td (1 - Tdap) Never done   Pneumococcal  Vaccine: 50+ Years (1 of 2 - PCV) Never done   Zoster Vaccines- Shingrix (1 of 2) 07/10/1992   OPHTHALMOLOGY EXAM  04/27/2022   COVID-19 Vaccine (7 - 2025-26 season) 07/28/2024      Objective:     BP (!) 123/56   Pulse (!) 46   Ht 5' 8 (1.727 m)   Wt 246 lb 1.9 oz (111.6 kg)   SpO2 94%   BMI 37.42 kg/m    Physical Exam Gen: alert, oriented Cv: rrr, no murmur Pulm: no respiratory distress Psych: pleasant affect   Results for orders placed or performed in visit on 10/10/24  POCT glycosylated hemoglobin (Hb A1C)  Result Value Ref Range   Hemoglobin A1C     HbA1c POC (<> result, manual entry) 6.9 4.0 - 5.6 %   HbA1c, POC (prediabetic range)     HbA1c, POC (controlled diabetic range)          Assessment & Plan:   Chronic diarrhea Assessment & Plan: New onset watery diarrhea for 2 weeks, occurring 3 times daily with urgency and near-incontinence. Patient recently stopped all medications to assess for causality, without effect. Etiology is unclear. Differential includes infectious vs inflammatory causes. - Plan to obtain stool studies, including a stool pathogen panel and fecal calprotectin. - Plan to obtain blood tests to evaluate for other causes. - Discussed use of over-the-counter Imodium as needed. - Follow-up in 4-6 weeks. Can cancel if symptoms resolve.  Orders: -     GI Profile, Stool, PCR; Future -     Calprotectin, Fecal; Future -  Comprehensive metabolic panel with GFR -     CBC with Differential/Platelet -     TSH  Controlled type 2 diabetes mellitus without complication, without long-term current use of insulin (HCC) Assessment & Plan: A1c 6.9 today.  Has only been off medications for 2 days d/t concerns they were causing diarrhea. - Will restart diabetes medications once cause of diarrhea is identified or it resolves. Pt to f/u in 1 month  Orders: -     POCT glycosylated hemoglobin (Hb A1C) -     TSH  Acute pain of right knee Assessment &  Plan: Intermittent pain secondary to mild arthritis. Currently improved. - Continue using topical Voltaren  gel and Tylenol  as needed. - Discussed risks/benefits of corticosteroid injection. Patient declined at this time as pain is not severe. Will reconsider if pain worsens.   Other orders -     Loperamide HCl; Take 2 capsules after first episode of diarrhea, and 1 capsule after each additional episode up to a max of 6 capsules a day  Dispense: 30 capsule; Refill: 1   I spent 33 min in the mgmt of this patient  Return in about 5 weeks (around 11/14/2024) for diarrhea.    Toribio MARLA Slain, MD

## 2024-10-10 NOTE — Assessment & Plan Note (Addendum)
 A1c 6.9 today.  Has only been off medications for 2 days d/t concerns they were causing diarrhea. - Will restart diabetes medications once cause of diarrhea is identified or it resolves. Pt to f/u in 1 month

## 2024-10-10 NOTE — Assessment & Plan Note (Signed)
 Intermittent pain secondary to mild arthritis. Currently improved. - Continue using topical Voltaren  gel and Tylenol  as needed. - Discussed risks/benefits of corticosteroid injection. Patient declined at this time as pain is not severe. Will reconsider if pain worsens.

## 2024-10-10 NOTE — Patient Instructions (Signed)
 It was nice to see you today,  We addressed the following topics today: - You can take Imodium for the diarrhea. Take 4 mg (two pills) with the first loose stool, and then 2 mg (one pill) after each additional loose stool, up to 16 mg per day. - We are providing you with a kit to collect a stool sample. Please collect a sample from Sunday or Monday and bring it back to the lab. Do not collect it today and wait until Monday to bring it in. - We will also do some blood tests today. The medical assistant will take you to the lab. - Continue using Voltaren  gel on your knee for pain as needed. - If your knee pain worsens significantly, you can make an appointment to discuss a steroid injection. - We will schedule a follow-up appointment in about one month. If the diarrhea resolves before then, you can cancel the visit.  Have a great day,  Rolan Slain, MD

## 2024-10-11 LAB — CBC WITH DIFFERENTIAL/PLATELET
Basophils Absolute: 0.1 x10E3/uL (ref 0.0–0.2)
Basos: 1 %
EOS (ABSOLUTE): 0.1 x10E3/uL (ref 0.0–0.4)
Eos: 3 %
Hematocrit: 39 % (ref 37.5–51.0)
Hemoglobin: 12.4 g/dL — ABNORMAL LOW (ref 13.0–17.7)
Immature Grans (Abs): 0 x10E3/uL (ref 0.0–0.1)
Immature Granulocytes: 0 %
Lymphocytes Absolute: 1.7 x10E3/uL (ref 0.7–3.1)
Lymphs: 37 %
MCH: 30.8 pg (ref 26.6–33.0)
MCHC: 31.8 g/dL (ref 31.5–35.7)
MCV: 97 fL (ref 79–97)
Monocytes Absolute: 0.5 x10E3/uL (ref 0.1–0.9)
Monocytes: 11 %
Neutrophils Absolute: 2.3 x10E3/uL (ref 1.4–7.0)
Neutrophils: 48 %
Platelets: 209 x10E3/uL (ref 150–450)
RBC: 4.03 x10E6/uL — ABNORMAL LOW (ref 4.14–5.80)
RDW: 13.8 % (ref 11.6–15.4)
WBC: 4.6 x10E3/uL (ref 3.4–10.8)

## 2024-10-11 LAB — COMPREHENSIVE METABOLIC PANEL WITH GFR
ALT: 17 IU/L (ref 0–44)
AST: 15 IU/L (ref 0–40)
Albumin: 3.9 g/dL (ref 3.7–4.7)
Alkaline Phosphatase: 77 IU/L (ref 48–129)
BUN/Creatinine Ratio: 12 (ref 10–24)
BUN: 24 mg/dL (ref 8–27)
Bilirubin Total: 0.3 mg/dL (ref 0.0–1.2)
CO2: 20 mmol/L (ref 20–29)
Calcium: 9.8 mg/dL (ref 8.6–10.2)
Chloride: 111 mmol/L — ABNORMAL HIGH (ref 96–106)
Creatinine, Ser: 1.98 mg/dL — ABNORMAL HIGH (ref 0.76–1.27)
Globulin, Total: 2.3 g/dL (ref 1.5–4.5)
Glucose: 114 mg/dL — ABNORMAL HIGH (ref 70–99)
Potassium: 4.3 mmol/L (ref 3.5–5.2)
Sodium: 145 mmol/L — ABNORMAL HIGH (ref 134–144)
Total Protein: 6.2 g/dL (ref 6.0–8.5)
eGFR: 33 mL/min/1.73 — ABNORMAL LOW (ref >59)

## 2024-10-11 LAB — TSH: TSH: 3.56 u[IU]/mL (ref 0.450–4.500)

## 2024-10-13 ENCOUNTER — Ambulatory Visit: Payer: Self-pay | Admitting: Family Medicine

## 2024-10-13 DIAGNOSIS — K529 Noninfective gastroenteritis and colitis, unspecified: Secondary | ICD-10-CM | POA: Diagnosis not present

## 2024-10-16 LAB — GI PROFILE, STOOL, PCR

## 2024-10-16 LAB — CALPROTECTIN, FECAL: Calprotectin, Fecal: 87 ug/g (ref 0–120)

## 2024-10-20 NOTE — Progress Notes (Signed)
 Called patient LVM to contact the office if pt call please advised

## 2024-10-21 ENCOUNTER — Ambulatory Visit: Payer: Medicare (Managed Care) | Admitting: Family Medicine

## 2024-10-22 ENCOUNTER — Telehealth: Payer: Self-pay

## 2024-10-22 MED ORDER — CHOLESTYRAMINE 4 G PO PACK: 4.0000 g | PACK | Freq: Three times a day (TID) | ORAL | 12 refills | Status: AC

## 2024-10-22 NOTE — Telephone Encounter (Signed)
 Copied from CRM #[id number]. Topic: General - Other >> Oct 22, 2024  8:48 AM Lonell PEDLAR wrote: Reason for CRM: Relayed message from TE. Pt is using Imodium .

## 2024-10-22 NOTE — Progress Notes (Signed)
 Called patient James Barnes to contact the office if pt calls please advise

## 2024-10-22 NOTE — Telephone Encounter (Signed)
 Please ask the pt if he is using the immodium we discussed last time and  let the patient know that I will send in something called cholestyramine , which is a powder that he can take up to 3 times a day with meals.  He should start with 1 time per day and increase up to 3 if needed.

## 2024-11-07 ENCOUNTER — Encounter: Payer: Self-pay | Admitting: Cardiology

## 2024-11-13 ENCOUNTER — Ambulatory Visit: Payer: Medicare (Managed Care) | Admitting: Family Medicine

## 2024-11-13 ENCOUNTER — Encounter: Payer: Self-pay | Admitting: Family Medicine

## 2024-11-13 VITALS — BP 95/60 | HR 74 | Ht 68.0 in | Wt 237.4 lb

## 2024-11-13 DIAGNOSIS — K529 Noninfective gastroenteritis and colitis, unspecified: Secondary | ICD-10-CM

## 2024-11-13 DIAGNOSIS — E119 Type 2 diabetes mellitus without complications: Secondary | ICD-10-CM | POA: Diagnosis not present

## 2024-11-13 DIAGNOSIS — R12 Heartburn: Secondary | ICD-10-CM | POA: Diagnosis not present

## 2024-11-13 NOTE — Assessment & Plan Note (Signed)
 Persistent heartburn due to irregular Prilosec use. - Restart Prilosec (omeprazole ) daily for four weeks. - Taper Prilosec to every other day and then as needed if symptoms improve. - Resume daily Prilosec if symptoms worsen after tapering.

## 2024-11-13 NOTE — Assessment & Plan Note (Signed)
 Resolved with use of cholestyramine . No longer using this.  Continue to monitor.

## 2024-11-13 NOTE — Progress Notes (Unsigned)
° °  Established Patient Office Visit  Subjective   Patient ID: James Barnes, male    DOB: [birth year]  Age: 83 y.o. MRN: [id number]  Chief Complaint  Patient presents with   Medical Management of Chronic Issues    Discussed the use of AI scribe software for clinical note transcription with the patient, who gave verbal consent to proceed.  History of Present Illness   James Barnes is an 83 year old male who presents with persistent heartburn and recent changes in bowel habits.  He experiences severe, persistent heartburn and frequently uses Tums for relief. He has not been taking his prescribed Prilosec (omeprazole ) regularly.  He previously experienced diarrhea, which was managed with cholestyramine , but it caused constipation, requiring a stool softener. Currently, his bowel movements have stabilized, and he is not experiencing diarrhea.  He has resumed all his previous medications except for Jardiance  and Gemtesa, which he initially stopped to determine their impact on his symptoms.          The ASCVD Risk score (Arnett DK, et al., 2019) failed to calculate for the following reasons:   The 2019 ASCVD risk score is only valid for ages 52 to 7   * - Cholesterol units were assumed  Health Maintenance Due  Topic Date Due   DTaP/Tdap/Td (1 - Tdap) Never done   Pneumococcal Vaccine: 50+ Years (1 of 2 - PCV) Never done   Zoster Vaccines- Shingrix (1 of 2) 07/10/1992   OPHTHALMOLOGY EXAM  04/27/2022   COVID-19 Vaccine (7 - 2025-26 season) 07/28/2024      Objective:     BP 95/60   Pulse 74   Ht 5' 8 (1.727 m)   Wt 237 lb 6.4 oz (107.7 kg)   SpO2 99%   BMI 36.10 kg/m  {Vitals History (Optional):23777}  Physical Exam            No results found for any visits on 11/13/24.      Assessment & Plan:   Chronic diarrhea Assessment & Plan: Resolved with use of cholestyramine . No longer using this.  Continue to monitor.    Heartburn Assessment &  Plan: Persistent heartburn due to irregular Prilosec use. - Restart Prilosec (omeprazole ) daily for four weeks. - Taper Prilosec to every other day and then as needed if symptoms improve. - Resume daily Prilosec if symptoms worsen after tapering.   Controlled type 2 diabetes mellitus without complication, without long-term current use of insulin (HCC) Assessment & Plan: Blood sugar control is a concern. Jardiance  beneficial for blood sugar, kidney, and heart health. - Restart Jardiance  for blood sugar control.      Assessment and Plan               Return in about 3 months (around 02/11/2025) for DM.    Toribio MARLA Slain, MD

## 2024-11-13 NOTE — Patient Instructions (Signed)
°  VISIT SUMMARY: You visited today due to persistent heartburn and recent changes in your bowel habits. We discussed your symptoms and reviewed your current medications.  YOUR PLAN: -GASTROESOPHAGEAL REFLUX DISEASE (GERD): GERD is a condition where stomach acid frequently flows back into the tube connecting your mouth and stomach, causing heartburn. You should restart taking Prilosec (omeprazole ) daily for four weeks. If your symptoms improve, you can taper the medication to every other day and then as needed. If your symptoms worsen after tapering, resume taking Prilosec daily.  -TYPE 2 DIABETES MELLITUS: Type 2 diabetes is a condition that affects the way your body processes blood sugar. Restart taking Jardiance  as it helps control blood sugar levels and is beneficial for your kidney and heart health.  INSTRUCTIONS: Please follow up in four weeks to review your symptoms and the effectiveness of the medication adjustments.

## 2024-11-13 NOTE — Assessment & Plan Note (Signed)
 Blood sugar control is a concern. Jardiance  beneficial for blood sugar, kidney, and heart health. - Restart Jardiance  for blood sugar control.

## 2024-11-14 ENCOUNTER — Ambulatory Visit: Payer: Medicare (Managed Care) | Admitting: Family Medicine

## 2024-11-18 NOTE — Progress Notes (Signed)
 Pharmacy Quality Measure Review  This patient is appearing on a report for being at risk of failing the adherence measure for hypertension (ACEi/ARB) medications this calendar year.   Medication: Losartan  100 mg Last fill date: 11/01/24 for 90 day supply  Insurance report was not up to date. No action needed at this time.   Jenkins Graces, PharmD PGY1 Pharmacy Resident

## 2024-12-10 ENCOUNTER — Other Ambulatory Visit: Payer: Self-pay

## 2024-12-10 DIAGNOSIS — N400 Enlarged prostate without lower urinary tract symptoms: Secondary | ICD-10-CM

## 2024-12-10 MED ORDER — FINASTERIDE 5 MG PO TABS: 5.0000 mg | ORAL_TABLET | Freq: Every day | ORAL | 1 refills | Status: AC

## 2025-02-04 ENCOUNTER — Other Ambulatory Visit: Payer: Medicare (Managed Care)

## 2025-02-11 ENCOUNTER — Ambulatory Visit: Payer: Medicare (Managed Care) | Admitting: Family Medicine

## 2025-04-09 ENCOUNTER — Ambulatory Visit: Payer: Medicare (Managed Care)
